# Patient Record
Sex: Male | Born: 1955
Health system: Southern US, Community
[De-identification: ages and names within clinical notes are randomized; demographics above are authoritative.]

## PROBLEM LIST (undated history)

## (undated) DIAGNOSIS — E7800 Pure hypercholesterolemia, unspecified: Secondary | ICD-10-CM

## (undated) DIAGNOSIS — M109 Gout, unspecified: Secondary | ICD-10-CM

## (undated) DIAGNOSIS — I2510 Atherosclerotic heart disease of native coronary artery without angina pectoris: Secondary | ICD-10-CM

## (undated) DIAGNOSIS — S82891A Other fracture of right lower leg, initial encounter for closed fracture: Secondary | ICD-10-CM

## (undated) DIAGNOSIS — I499 Cardiac arrhythmia, unspecified: Secondary | ICD-10-CM

## (undated) DIAGNOSIS — I639 Cerebral infarction, unspecified: Secondary | ICD-10-CM

## (undated) DIAGNOSIS — I442 Atrioventricular block, complete: Secondary | ICD-10-CM

## (undated) HISTORY — DX: Pure hypercholesterolemia, unspecified: E78.00

## (undated) HISTORY — DX: Cardiac arrhythmia, unspecified: I49.9

## (undated) HISTORY — DX: Cerebral infarction, unspecified: I63.9

## (undated) HISTORY — DX: Gout, unspecified: M10.9

## (undated) HISTORY — DX: Other fracture of right lower leg, initial encounter for closed fracture: S82.891A

## (undated) HISTORY — PX: OTHER SURGICAL HISTORY: SHX169

---

## 2011-06-29 DIAGNOSIS — I499 Cardiac arrhythmia, unspecified: Secondary | ICD-10-CM

## 2011-06-29 HISTORY — DX: Cardiac arrhythmia, unspecified: I49.9

## 2018-04-04 DIAGNOSIS — Z23 Encounter for immunization: Secondary | ICD-10-CM | POA: Diagnosis not present

## 2018-06-09 ENCOUNTER — Telehealth: Payer: Self-pay | Admitting: *Deleted

## 2018-06-09 NOTE — Telephone Encounter (Signed)
Referral sent to scheduling and notes on file. °

## 2018-06-23 DIAGNOSIS — E7800 Pure hypercholesterolemia, unspecified: Secondary | ICD-10-CM

## 2018-06-23 DIAGNOSIS — I4891 Unspecified atrial fibrillation: Secondary | ICD-10-CM | POA: Insufficient documentation

## 2018-06-23 DIAGNOSIS — S82891A Other fracture of right lower leg, initial encounter for closed fracture: Secondary | ICD-10-CM

## 2018-06-23 HISTORY — DX: Unspecified atrial fibrillation: I48.91

## 2018-06-23 NOTE — Progress Notes (Signed)
Cardiology Office Note   Date:  06/26/2018   ID:  Levi ConnersMarc Cirrincione, DOB [birth year], MRN [id number]  PCP:  Patient, No Pcp Per  Cardiologist:   Charlton HawsPeter Gicela Schwarting, MD   No chief complaint on file.     History of Present Illness: Levi Davis is a 62 y.o. male who presents for consultation regarding PAF and HLD. Referred for f/u by his cardiologist in [place] Dr Lovette ClicheSoliman [place] Distant history of PAF in 2013. Low CHADSVASC not on anticoagulation TTE from 07/07/15 Normal EF 55-60% borderline LAE and trivial MR Family history remarkable for father needing a PPM Non smoker occassional ETOH Previously maintained on Multaq and atenolol August 2018 Multaq d/c but seemed to have more PAF. He indicated "going down the ablation path just before coming to [place]"   He works in [employer] Got job here in July Wife is a NP. Two children [place] got a [place] and [place].  Like to golf Has apple watch and usually knows when he is in afib Infrequent and only lasts minutes   Past Medical History:  Diagnosis Date  . Arrhythmia 2013   AFIB  . Closed right ankle fracture   . Hypercholesterolemia        Current Outpatient Medications  Medication Sig Dispense Refill  . allopurinol (ZYLOPRIM) 300 MG tablet Take 300 mg by mouth daily.    Marland Kitchen. atenolol (TENORMIN) 50 MG tablet Take 25 mg by mouth daily.    Marland Kitchen. atorvastatin (LIPITOR) 20 MG tablet Take 10 mg by mouth daily.    . Cholecalciferol (VITAMIN D3) 125 MCG (5000 UT) CAPS Take by mouth.    . co-enzyme Q-10 30 MG capsule Take 30 mg by mouth daily.    Marland Kitchen. dronedarone (MULTAQ) 400 MG tablet Take 200 mg by mouth 2 (two) times daily with a meal.    . Krill Oil 1000 MG CAPS Take 2 capsules by mouth 2 (two) times daily.    . medium chain triglycerides (MCT OIL) oil Take 15 mLs by mouth daily.    . Multiple Vitamin (MULTIVITAMIN) tablet Take 1 tablet by mouth daily.    . Omega-3 Fatty Acids (FISH OIL) 1000 MG CAPS Take 2 capsules by mouth daily.    . Probiotic  Product (PROBIOTIC-10) CAPS Take 2-3 capsules by mouth daily.    . vitamin B-12 (CYANOCOBALAMIN) 1000 MCG tablet Take 1,000 mcg by mouth 2 (two) times daily.     No current facility-administered medications for this visit.     Allergies:   Patient has no known allergies.    Social History:  The patient  reports that he has never smoked. He has never used smokeless tobacco. He reports current alcohol use. He reports that he does not use drugs.   Family History:  The patient's family history includes Aneurysm in his sister; Anuerysm in his mother; Heart Problems in his father; High blood pressure in his father, mother, and sister; Obesity in his sister; Stroke in his mother.    ROS:  Please see the history of present illness.   Otherwise, review of systems are positive for none.   All other systems are reviewed and negative.    PHYSICAL EXAM: VS:  BP 110/78 (BP Location: Left Arm, Patient Position: Sitting, Cuff Size: Normal)   Pulse (!) 57   Ht 6' (1.829 m)   Wt 192 lb 1.9 oz (87.1 kg)   BMI 26.06 kg/m  , BMI Body mass index is  26.06 kg/m. Affect appropriate Healthy:  appears stated age HEENT: normal Neck supple with no adenopathy JVP normal no bruits no thyromegaly Lungs clear with no wheezing and good diaphragmatic motion Heart:  S1/S2 no murmur, no rub, gallop or click PMI normal Abdomen: benighn, BS positve, no tenderness, no AAA no bruit.  No HSM or HJR Distal pulses intact with no bruits No edema Neuro non-focal Skin warm and dry No muscular weakness    EKG:  06/26/18 SR rate 57 LAE/LAD/LVH QT 457c   Recent Labs: No results found for requested labs within last 8760 hours.    Lipid Panel No results found for: CHOL, TRIG, HDL, CHOLHDL, VLDL, LDLCALC, LDLDIRECT    Wt Readings from Last 3 Encounters:  06/26/18 192 lb 1.9 oz (87.1 kg)      Other studies Reviewed: Additional studies/ records that were reviewed today include: Notes from cardiology in [place], TTE  labs .    ASSESSMENT AND PLAN:  1.  PAF:  Distant history May 2013. Seems to have self limited recurrences on Multaq. He prefers to continue as is for now and not be referred to EP for ablation  Low CHADVASC no need for anticoagulation Update TTE to assess atrial chamber sizes valve disease and EF  2. HLD   Continue statin lab with primary  3. CAD:  No history of discussed utility of calcium score to further risk stratify and guide tightness of LDL control on statin    Current medicines are reviewed at length with the patient today.  The patient does not have concerns regarding medicines.  The following changes have been made:  no change  Labs/ tests ordered today include: Calcium Score TTE   Orders Placed This Encounter  Procedures  . CT CARDIAC SCORING  . EKG 12-Lead  . ECHOCARDIOGRAM COMPLETE     Disposition:   FU with cardiology PRN      Signed, Charlton HawsPeter Abiageal Blowe, MD  06/26/2018 4:19 PM    [facility] [address], [place], [place]  [address] Phone: [phone]; Fax: [phone]) [phone]

## 2018-06-26 ENCOUNTER — Ambulatory Visit (INDEPENDENT_AMBULATORY_CARE_PROVIDER_SITE_OTHER): Payer: BLUE CROSS/BLUE SHIELD | Admitting: Cardiovascular Disease

## 2018-06-26 ENCOUNTER — Encounter: Payer: Self-pay | Admitting: Cardiovascular Disease

## 2018-06-26 VITALS — BP 110/78 | HR 57 | Ht 72.0 in | Wt 192.1 lb

## 2018-06-26 DIAGNOSIS — I4891 Unspecified atrial fibrillation: Secondary | ICD-10-CM | POA: Diagnosis not present

## 2018-06-26 MED ORDER — DRONEDARONE HCL 400 MG PO TABS: 200.0000 mg | ORAL_TABLET | Freq: Two times a day (BID) | ORAL | 3 refills | Status: DC

## 2018-06-26 MED ORDER — ATENOLOL 50 MG PO TABS: 25.0000 mg | ORAL_TABLET | Freq: Every day | ORAL | 3 refills | Status: DC

## 2018-06-26 NOTE — Patient Instructions (Addendum)
Medication Instructions:   If you need a refill on your cardiac medications before your next appointment, please call your pharmacy.   Lab work:  If you have labs (blood work) drawn today and your tests are completely normal, you will receive your results only by: Marland Kitchen. MyChart Message (if you have MyChart) OR . A paper copy in the mail If you have any lab test that is abnormal or we need to change your treatment, we will call you to review the results.  Testing/Procedures: Your physician has requested that you have an echocardiogram. Echocardiography is a painless test that uses sound waves to create images of your heart. It provides your doctor with information about the size and shape of your heart and how well your heart's chambers and valves are working. This procedure takes approximately one hour. There are no restrictions for this procedure.  Non-Cardiac CT scanning for calcium score, (CAT scanning), is a noninvasive, special x-ray that produces cross-sectional images of the body using x-rays and a computer. CT scans help physicians diagnose and treat medical conditions. For some CT exams, a contrast material is used to enhance visibility in the area of the body being studied. CT scans provide greater clarity and reveal more details than regular x-ray exams.  Follow-Up: At [facility], you and your health needs are our priority.  As part of our continuing mission to provide you with exceptional heart care, we have created designated Provider Care Teams.  These Care Teams include your primary Cardiologist (physician) and Advanced Practice Providers (APPs -  Physician Assistants and Nurse Practitioners) who all work together to provide you with the care you need, when you need it. You will need a follow up appointment in 6 months.  Please call our office 2 months in advance to schedule this appointment.  You may see Dr. Eden EmmsNishan or one of the following Advanced Practice Providers on your  designated Care Team:   Norma FredricksonLori Gerhardt, NP Nada BoozerLaura Ingold, NP . Georgie ChardJill McDaniel, NP

## 2018-06-26 NOTE — Addendum Note (Signed)
Addended by: Virl AxePATE INGALLS, Shaila Gilchrest L on: 06/26/2018 05:00 PM   Modules accepted: Orders

## 2018-11-15 ENCOUNTER — Telehealth (HOSPITAL_COMMUNITY): Payer: Self-pay | Admitting: Radiology

## 2018-11-15 NOTE — Telephone Encounter (Signed)
Left message reporting appointment time change for echocardiogram. Appointment moved from 830 am to 845 am.

## 2018-11-24 ENCOUNTER — Telehealth (HOSPITAL_COMMUNITY): Payer: Self-pay | Admitting: Cardiology

## 2018-11-24 NOTE — Telephone Encounter (Signed)

## 2018-11-27 ENCOUNTER — Ambulatory Visit
Admission: RE | Admit: 2018-11-27 | Discharge: 2018-11-27 | Disposition: A | Discharge status: Home / Self Care | Payer: Self-pay | Source: Ambulatory Visit | Attending: Cardiovascular Disease | Admitting: Cardiovascular Disease

## 2018-11-27 ENCOUNTER — Ambulatory Visit (HOSPITAL_COMMUNITY): Payer: BLUE CROSS/BLUE SHIELD | Attending: Cardiology

## 2018-11-27 ENCOUNTER — Other Ambulatory Visit: Payer: Self-pay

## 2018-11-27 ENCOUNTER — Telehealth: Payer: Self-pay | Admitting: Pharmacist

## 2018-11-27 ENCOUNTER — Telehealth: Payer: Self-pay | Admitting: Cardiovascular Disease

## 2018-11-27 DIAGNOSIS — I4891 Unspecified atrial fibrillation: Secondary | ICD-10-CM

## 2018-11-27 DIAGNOSIS — E785 Hyperlipidemia, unspecified: Secondary | ICD-10-CM

## 2018-11-27 NOTE — Telephone Encounter (Signed)
Virtual Visit Pre-Appointment Phone Call  "(Name), I am calling you today to discuss your upcoming appointment. We are currently trying to limit exposure to the virus that causes COVID-19 by seeing patients at home rather than in the office."  1. "What is the BEST phone number to call the day of the visit?" - include this in appointment notes  2. "Do you have or have access to (through a family member/friend) a smartphone with video capability that we can use for your visit?" a. If yes - list this number in appt notes as "cell" (if different from BEST phone #) and list the appointment type as a VIDEO visit in appointment notes b. If no - list the appointment type as a PHONE visit in appointment notes  3. Confirm consent - "In the setting of the current Covid19 crisis, you are scheduled for a (phone or video) visit with your provider on (date) at (time).  Just as we do with many in-office visits, in order for you to participate in this visit, we must obtain consent.  If you'd like, I can send this to your mychart (if signed up) or email for you to review.  Otherwise, I can obtain your verbal consent now.  All virtual visits are billed to your insurance company just like a normal visit would be.  By agreeing to a virtual visit, we'd like you to understand that the technology does not allow for your provider to perform an examination, and thus may limit your provider's ability to fully assess your condition. If your provider identifies any concerns that need to be evaluated in person, we will make arrangements to do so.  Finally, though the technology is pretty good, we cannot assure that it will always work on either your or our end, and in the setting of a video visit, we may have to convert it to a phone-only visit.  In either situation, we cannot ensure that we have a secure connection.  Are you willing to proceed? Yes  4. Advise patient to be prepared - "Two hours prior to your appointment, go  ahead and check your blood pressure, pulse, oxygen saturation, and your weight (if you have the equipment to check those) and write them all down. When your visit starts, your provider will ask you for this information. If you have an Apple Watch or Kardia device, please plan to have heart rate information ready on the day of your appointment. Please have a pen and paper handy nearby the day of the visit as well."  5. Give patient instructions for MyChart download to smartphone OR Doximity/Doxy.me as below if video visit (depending on what platform provider is using)  6. Inform patient they will receive a phone call 15 minutes prior to their appointment time (may be from unknown caller ID) so they should be prepared to answer    TELEPHONE CALL NOTE  Levi Davis has been deemed a candidate for a follow-up tele-health visit to limit community exposure during the Covid-19 pandemic. I spoke with the patient via phone to ensure availability of phone/video source, confirm preferred email & phone number, and discuss instructions and expectations.  I reminded Levi Davis to be prepared with any vital sign and/or heart rhythm information that could potentially be obtained via home monitoring, at the time of his visit. I reminded Levi Davis to expect a phone call prior to his visit.  Ethelda Chick, RN 11/27/2018 3:09 PM   INSTRUCTIONS FOR DOWNLOADING THE  MYCHART APP TO SMARTPHONE  - The patient must first make sure to have activated MyChart and know their login information - If Apple, go to [employer] and type in MyChart in the search bar and download the app. If Android, ask patient to go to [employer] and type in [place] in the search bar and download the app. The app is free but as with any other app downloads, their phone may require them to verify saved payment information or Apple/Android password.  - The patient will need to then log into the app with their MyChart username and  password, and select [facility] as their healthcare provider to link the account. When it is time for your visit, go to the MyChart app, find appointments, and click Begin Video Visit. Be sure to Select Allow for your device to access the Microphone and Camera for your visit. You will then be connected, and your provider will be with you shortly.  **If they have any issues connecting, or need assistance please contact MyChart service desk [phone])**  **If using a computer, in order to ensure the best quality for their visit they will need to use either of the following Internet Browsers: [employer], or Google Chrome**  IF USING DOXIMITY or DOXY.ME - The patient will receive a link just prior to their visit by text.     FULL LENGTH CONSENT FOR TELE-HEALTH VISIT   I hereby voluntarily request, consent and authorize [facility] and its employed or contracted physicians, physician assistants, nurse practitioners or other licensed health care professionals (the Practitioner), to provide me with telemedicine health care services (the "Services") as deemed necessary by the treating Practitioner. I acknowledge and consent to receive the Services by the Practitioner via telemedicine. I understand that the telemedicine visit will involve communicating with the Practitioner through live audiovisual communication technology and the disclosure of certain medical information by electronic transmission. I acknowledge that I have been given the opportunity to request an in-person assessment or other available alternative prior to the telemedicine visit and am voluntarily participating in the telemedicine visit.  I understand that I have the right to withhold or withdraw my consent to the use of telemedicine in the course of my care at any time, without affecting my right to future care or treatment, and that the Practitioner or I may terminate the telemedicine visit at any time. I  understand that I have the right to inspect all information obtained and/or recorded in the course of the telemedicine visit and may receive copies of available information for a reasonable fee.  I understand that some of the potential risks of receiving the Services via telemedicine include:  Marland Kitchen Delay or interruption in medical evaluation due to technological equipment failure or disruption; . Information transmitted may not be sufficient (e.g. poor resolution of images) to allow for appropriate medical decision making by the Practitioner; and/or  . In rare instances, security protocols could fail, causing a breach of personal health information.  Furthermore, I acknowledge that it is my responsibility to provide information about my medical history, conditions and care that is complete and accurate to the best of my ability. I acknowledge that Practitioner's advice, recommendations, and/or decision may be based on factors not within their control, such as incomplete or inaccurate data provided by me or distortions of diagnostic images or specimens that may result from electronic transmissions. I understand that the practice of medicine is not an exact science and that Practitioner makes  no warranties or guarantees regarding treatment outcomes. I acknowledge that I will receive a copy of this consent concurrently upon execution via email to the email address I last provided but may also request a printed copy by calling the office of [facility].    I understand that my insurance will be billed for this visit.   I have read or had this consent read to me. . I understand the contents of this consent, which adequately explains the benefits and risks of the Services being provided via telemedicine.  . I have been provided ample opportunity to ask questions regarding this consent and the Services and have had my questions answered to my satisfaction. . I give my informed consent for the services to be  provided through the use of telemedicine in my medical care  By participating in this telemedicine visit I agree to the above.

## 2018-11-27 NOTE — Telephone Encounter (Signed)
Left message to set up time to discuss lipids/Nexletol in the next week or so.

## 2018-11-27 NOTE — Telephone Encounter (Signed)
-----   Message from Ethelda Chick, RN sent at 11/27/2018  3:07 PM EDT ----- Patient needs lipid clinic appt to discuss nexletol. Would like to have before Dr. Fabio Bering office visit on 12/11/18.   Thank you, Pam

## 2018-11-27 NOTE — Telephone Encounter (Signed)
Patient aware of Dr. Fabio Bering advisement. Per Dr. Eden Emms, LDL too high if he is worried about risk of MI. Have him see lipid clinic for statin alternative ? Nexletol. Will place order for lipid clinic referral and have them see him before his virtual visit with Dr. Eden Emms.

## 2018-11-27 NOTE — Telephone Encounter (Signed)
Please call patient with results of CT scan.

## 2018-11-30 NOTE — Telephone Encounter (Signed)
Spoke with patient. Scheduled for virtual visit on 6/10 at 1:00

## 2018-12-04 NOTE — Progress Notes (Signed)
Virtual Visit via Video Note   This visit type was conducted due to national recommendations for restrictions regarding the COVID-19 Pandemic (e.g. social distancing) in an effort to limit this patient's exposure and mitigate transmission in our community.  Due to his co-morbid illnesses, this patient is at least at moderate risk for complications without adequate follow up.  This format is felt to be most appropriate for this patient at this time.  All issues noted in this document were discussed and addressed.  A limited physical exam was performed with this format.  Please refer to the patient's chart for his consent to telehealth for [facility].   Date:  12/11/2018   ID:  Levi Davis, DOB [birth year], MRN [id number]  Patient Location: Home Provider Location: Office  PCP:  Patient, No Pcp Per  Cardiologist:   Eden EmmsNishan Electrophysiologist:  None   Evaluation Performed:  Follow-Up Visit  Chief Complaint:  PAF, HLD   History of Present Illness:    63 y.o. history of HLD, PAF. Low CHADVASC score not on anticoagulation last episode PAF 2013 with normal EF by echo 2017 Previously on Multaq and atenolol stopped in 2018  Calcium score 11/27/18 192 elevated 73 rd percentile for age. Echo updated 11/27/18 EF 60-65% no valve disease LA 4.2 cm  Did not want to be on statin and referred to lipid clinic to consider nexletol or alternative   He works in [employer] Got job here in July Wife is a NP. Two children one just got a [place] and one  [place].  Like to golf Has apple watch and usually knows when he is in afib Infrequent and only lasts minutes  The patient  does not have symptoms concerning for COVID-19 infection (fever, chills, cough, or new shortness of breath).    Past Medical History:  Diagnosis Date  . Arrhythmia 2013   AFIB  . Closed right ankle fracture   . Hypercholesterolemia       Current Meds  Medication Sig  . atenolol (TENORMIN) 50 MG tablet Take 0.5  tablets (25 mg total) by mouth daily.  . Cholecalciferol (VITAMIN D3) 125 MCG (5000 UT) CAPS Take by mouth.  . co-enzyme Q-10 30 MG capsule Take 30 mg by mouth daily.  Marland Kitchen. dronedarone (MULTAQ) 400 MG tablet Take 0.5 tablets (200 mg total) by mouth 2 (two) times daily with a meal.  . Krill Oil 1000 MG CAPS Take 2 capsules by mouth 2 (two) times daily.  . medium chain triglycerides (MCT OIL) oil Take 15 mLs by mouth daily.  . Multiple Vitamin (MULTIVITAMIN) tablet Take 1 tablet by mouth daily.  . Omega-3 Fatty Acids (FISH OIL) 1000 MG CAPS Take 2 capsules by mouth daily.  . Probiotic Product (PROBIOTIC-10) CAPS Take 2-3 capsules by mouth daily.  . vitamin B-12 (CYANOCOBALAMIN) 1000 MCG tablet Take 1,000 mcg by mouth 2 (two) times daily.     Allergies:   Patient has no known allergies.   Social History   Tobacco Use  . Smoking status: Never Smoker  . Smokeless tobacco: Never Used  Substance Use Topics  . Alcohol use: Yes  . Drug use: Never     Family Hx: The patient's family history includes Aneurysm in his sister; Anuerysm in his mother; Heart Problems in his father; High blood pressure in his father, mother, and sister; Obesity in his sister; Stroke in his mother.  ROS:   Please see the history of present illness.  All other systems reviewed and are negative.   Prior CV studies:   The following studies were reviewed today:  Echo 6/1 EF normal mild LAE  Calcium score 6/1/ 192   Labs/Other Tests and Data Reviewed:    EKG:  NSR normal ECG   Recent Labs: No results found for requested labs within last 8760 hours.   Recent Lipid Panel No results found for: CHOL, TRIG, HDL, CHOLHDL, LDLCALC, LDLDIRECT  Wt Readings from Last 3 Encounters:  12/11/18 77.6 kg  06/26/18 87.1 kg     Objective:    Vital Signs:  Pulse 62   Ht 6' (1.829 m)   Wt 77.6 kg   BMI 23.19 kg/m    Skin warm and dry No distress No tachypnea No JVP elevation  Neuro appears non focal No edema   Telephone visit no exam   ASSESSMENT & PLAN:    PAF:  Does not want to be referred for ablation continue Multaq limited break through  HLD:  See notes from lipid clinic Target LDL less than 70 see below  Lipi med profile in 6 months Did mention red yeast rice to him as natural alternative  CAD: sublinical with high calcium score for age 37 rd percentile at 32 Risk factor modification   COVID-19 Education: The signs and symptoms of COVID-19 were discussed with the patient and how to seek care for testing (follow up with PCP or arrange E-visit).  The importance of social distancing was discussed today.  Time:   Today, I have spent 30 minutes with the patient with telehealth technology discussing the above problems.     Medication Adjustments/Labs and Tests Ordered: Current medicines are reviewed at length with the patient today.  Concerns regarding medicines are outlined above.   Tests Ordered:  Lipo Med Profile in 6 months   Medication Changes:  None   Disposition:  F/U in 6 months   Signed, Jenkins Rouge, MD  12/11/2018 8:03 AM    [place]

## 2018-12-06 ENCOUNTER — Telehealth (INDEPENDENT_AMBULATORY_CARE_PROVIDER_SITE_OTHER): Payer: BLUE CROSS/BLUE SHIELD | Admitting: Pharmacist

## 2018-12-06 ENCOUNTER — Other Ambulatory Visit: Payer: Self-pay

## 2018-12-06 DIAGNOSIS — E7800 Pure hypercholesterolemia, unspecified: Secondary | ICD-10-CM

## 2018-12-06 NOTE — Progress Notes (Signed)
Patient ID: Levi Davis                 DOB: 04/16/1956                    MRN: [id number]     HPI: Levi Davis is a 63 y.o. male patient referred to lipid clinic by Dr. Johnsie Cancel. PMH is significant for afib, hyperlipidemia and CAD (coronary calcium score 192 -73rd percentile).  Visit today was done over the phone due to [place] 19 Pandemic.  Patient was very pleasant on the phone, but was up front about not really wanting to be on medications. He has recently made several lifestyle changes. He has lost 40lb, switched to a keto diet, significantly improved his fasting glucose, increased his exercise. 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  He has concerns over memory issues reported with statins. He stopped his Lipitor about 6 months ago. States that he would like to see what his coronary calcium score is in 6 months to see if his his lifestyle changes have made any improvement. He states that he does have a family history of CAD, but his parents smoked and drank and were over weight.   Current Medications: none Intolerances: none Risk Factors: significant calcium in proximal and mid LAD LDL goal: <70  Diet: keto diet, eliminated carbs/sugars from diet, intermittent fasting, occassional 36hr fasts  Exercise: exercises regularly   Family History: The patient's family history includes Aneurysm in his sister; Anuerysm in his mother; Heart Problems in his father; High blood pressure in his father, mother, and sister; Obesity in his sister; Stroke in his mother.    Social History: The patient  reports that he has never smoked. He has never used smokeless tobacco. He reports current alcohol use. He reports that he does not use drugs.   Labs: Last LDL was 160, he went for labwork today-still pending  Past Medical History:  Diagnosis Date  . Arrhythmia 2013   AFIB  . Closed right ankle fracture   . Hypercholesterolemia     Current  Outpatient Medications on File Prior to Visit  Medication Sig Dispense Refill  . allopurinol (ZYLOPRIM) 300 MG tablet Take 300 mg by mouth daily.    Marland Kitchen atenolol (TENORMIN) 50 MG tablet Take 0.5 tablets (25 mg total) by mouth daily. 90 tablet 3  . atorvastatin (LIPITOR) 20 MG tablet Take 10 mg by mouth daily.    . Cholecalciferol (VITAMIN D3) 125 MCG (5000 UT) CAPS Take by mouth.    . co-enzyme Q-10 30 MG capsule Take 30 mg by mouth daily.    Marland Kitchen dronedarone (MULTAQ) 400 MG tablet Take 0.5 tablets (200 mg total) by mouth 2 (two) times daily with a meal. 90 tablet 3  . Krill Oil 1000 MG CAPS Take 2 capsules by mouth 2 (two) times daily.    . medium chain triglycerides (MCT OIL) oil Take 15 mLs by mouth daily.    . Multiple Vitamin (MULTIVITAMIN) tablet Take 1 tablet by mouth daily.    . Omega-3 Fatty Acids (FISH OIL) 1000 MG CAPS Take 2 capsules by mouth daily.    . Probiotic Product (PROBIOTIC-10) CAPS Take 2-3 capsules by mouth daily.    . vitamin B-12 (CYANOCOBALAMIN) 1000 MCG tablet Take 1,000 mcg by mouth 2 (two) times daily.     No current facility-administered medications on file prior to visit.     No Known Allergies  Assessment/Plan:  1. Hyperlipidemia - LDL is not at goal of <70. Discussed patients ASCVD risk score of 8.5% (intermediate risk), however his coronary calcium score pushes him into a higher risk category. I discussed how I would strongly recommend he be on a statin. Discussed how statins stabilize plaque, help with plaque regression and reduce his risk of heart attack and stroke. Explained that 75% of the cholesterol we have is made by our body, only 25% is from our diet. Therefore, we can make great changes to our diet, but sometimes it is not enough to lower our LDL to goal due to genetics. I did commend the patient on his lifestyle changes. Patient wishes to discuss with Dr. Eden EmmsNishan on Monday and to hopefully wait another 6 months and then repeat coronary calcium score  before making a decision.   Thank you,  Olene FlossMelissa D Giuliano Preece, Pharm.D, BCPS [facility]  [address], [place], [place] [address]  Phone: [phone]; Fax: [phone]

## 2018-12-11 ENCOUNTER — Telehealth (INDEPENDENT_AMBULATORY_CARE_PROVIDER_SITE_OTHER): Payer: BLUE CROSS/BLUE SHIELD | Admitting: Cardiovascular Disease

## 2018-12-11 ENCOUNTER — Other Ambulatory Visit: Payer: Self-pay

## 2018-12-11 ENCOUNTER — Encounter: Payer: Self-pay | Admitting: Cardiovascular Disease

## 2018-12-11 VITALS — HR 62 | Ht 72.0 in | Wt 171.0 lb

## 2018-12-11 DIAGNOSIS — I480 Paroxysmal atrial fibrillation: Secondary | ICD-10-CM

## 2018-12-11 DIAGNOSIS — E782 Mixed hyperlipidemia: Secondary | ICD-10-CM

## 2018-12-11 NOTE — Patient Instructions (Addendum)
Medication Instructions:   If you need a refill on your cardiac medications before your next appointment, please call your pharmacy.   Lab work: Your physician recommends that you have a Lipomed Profile lab work done. Someone will call you to schedule this appointment.  If you have labs (blood work) drawn today and your tests are completely normal, you will receive your results only by: Marland Kitchen MyChart Message (if you have MyChart) OR . A paper copy in the mail If you have any lab test that is abnormal or we need to change your treatment, we will call you to review the results.  Testing/Procedures:   Follow-Up: At [facility], you and your health needs are our priority.  As part of our continuing mission to provide you with exceptional heart care, we have created designated Provider Care Teams.  These Care Teams include your primary Cardiologist (physician) and Advanced Practice Providers (APPs -  Physician Assistants and Nurse Practitioners) who all work together to provide you with the care you need, when you need it. You will need a follow up appointment in 6 months.  Please call our office 2 months in advance to schedule this appointment.  You may see Dr. Johnsie Cancel or one of the following Advanced Practice Providers on your designated Care Team:   Truitt Merle, NP Cecilie Kicks, NP . Kathyrn Drown, NP

## 2018-12-14 ENCOUNTER — Telehealth: Payer: Self-pay

## 2018-12-14 NOTE — Telephone Encounter (Signed)
    COVID-19 Pre-Screening Questions:  . In the past 7 to 10 days have you had a cough,  shortness of breath, headache, congestion, fever (100 or greater) body aches, chills, sore throat, or sudden loss of taste or sense of smell? . Have you been around anyone with known Covid 19. . Have you been around anyone who is awaiting Covid 19 test results in the past 7 to 10 days? . Have you been around anyone who has been exposed to Covid 19, or has mentioned symptoms of Covid 19 within the past 7 to 10 days?  If you have any concerns/questions about symptoms patients report during screening (either on the phone or at threshold). Contact the provider seeing the patient or DOD for further guidance.  If neither are available contact a member of the leadership team.          Pt answered No to all questions. kb

## 2018-12-15 ENCOUNTER — Other Ambulatory Visit: Payer: BLUE CROSS/BLUE SHIELD | Admitting: *Deleted

## 2018-12-15 ENCOUNTER — Other Ambulatory Visit: Payer: Self-pay

## 2018-12-15 DIAGNOSIS — E782 Mixed hyperlipidemia: Secondary | ICD-10-CM | POA: Diagnosis not present

## 2018-12-15 DIAGNOSIS — I480 Paroxysmal atrial fibrillation: Secondary | ICD-10-CM

## 2018-12-16 LAB — NMR, LIPOPROFILE
Cholesterol, Total: 213 mg/dL — ABNORMAL HIGH (ref 100–199)
HDL Particle Number: 32.7 umol/L (ref >=30.5)
HDL-C: 61 mg/dL (ref >39)
LDL Particle Number: 1368 nmol/L — ABNORMAL HIGH (ref <1000)
LDL Size: 21.7 nm (ref >20.5)
LDL-C: 138 mg/dL — ABNORMAL HIGH (ref 0–99)
LP-IR Score: 25 (ref <=45)
Small LDL Particle Number: 287 nmol/L (ref <=527)
Triglycerides: 70 mg/dL (ref 0–149)

## 2018-12-27 ENCOUNTER — Ambulatory Visit: Payer: BLUE CROSS/BLUE SHIELD

## 2019-06-24 ENCOUNTER — Other Ambulatory Visit: Payer: Self-pay | Admitting: Cardiovascular Disease

## 2019-06-26 ENCOUNTER — Encounter: Payer: Self-pay | Admitting: *Deleted

## 2019-07-05 ENCOUNTER — Encounter: Payer: Self-pay | Admitting: Cardiology

## 2019-07-05 ENCOUNTER — Telehealth (INDEPENDENT_AMBULATORY_CARE_PROVIDER_SITE_OTHER): Admitting: Cardiology

## 2019-07-05 ENCOUNTER — Other Ambulatory Visit: Payer: Self-pay

## 2019-07-05 ENCOUNTER — Telehealth: Payer: Self-pay

## 2019-07-05 VITALS — BP 110/60 | HR 62 | Temp 97.0°F | Ht 72.0 in | Wt 179.0 lb

## 2019-07-05 DIAGNOSIS — E782 Mixed hyperlipidemia: Secondary | ICD-10-CM

## 2019-07-05 DIAGNOSIS — I480 Paroxysmal atrial fibrillation: Secondary | ICD-10-CM

## 2019-07-05 DIAGNOSIS — E7800 Pure hypercholesterolemia, unspecified: Secondary | ICD-10-CM

## 2019-07-05 NOTE — Patient Instructions (Addendum)
Medication Instructions:   Your physician recommends that you continue on your current medications as directed. Please refer to the Current Medication list given to you today.  *If you need a refill on your cardiac medications before your next appointment, please call your pharmacy*  Lab Work:  Your physician recommends that you return for lab work on 11/09/19 at 8:30AM, you will need to be fasting for these labs.  If you have labs (blood work) drawn today and your tests are completely normal, you will receive your results only by: Marland Kitchen MyChart Message (if you have MyChart) OR . A paper copy in the mail If you have any lab test that is abnormal or we need to change your treatment, we will call you to review the results.  Testing/Procedures:  None ordered today  Follow-Up: At [facility], you and your health needs are our priority.  As part of our continuing mission to provide you with exceptional heart care, we have created designated Provider Care Teams.  These Care Teams include your primary Cardiologist (physician) and Advanced Practice Providers (APPs -  Physician Assistants and Nurse Practitioners) who all work together to provide you with the care you need, when you need it.  Your next appointment:    Follow up with Dr. Eden Emms on 12/03/19 (Monday) at 9:15AM

## 2019-07-05 NOTE — Progress Notes (Signed)
Virtual Visit via Video Note   This visit type was conducted due to national recommendations for restrictions regarding the COVID-19 Pandemic (e.g. social distancing) in an effort to limit this patient's exposure and mitigate transmission in our community.  Due to his co-morbid illnesses, this patient is at least at moderate risk for complications without adequate follow up.  This format is felt to be most appropriate for this patient at this time.  All issues noted in this document were discussed and addressed.  A limited physical exam was performed with this format.  Please refer to the patient's chart for his consent to telehealth for [facility]   Date:  07/05/2019   ID:  Levi Davis, DOB 11/10/1955, MRN [id number]  Patient Location: Other:  office in [place] Provider Location: Office  PCP:  Patient, No Pcp Per  Cardiologist:  Charlton Haws, MD  Electrophysiologist:  None   Evaluation Performed:  Follow-Up Visit  Chief Complaint:  PAF, HLD  History of Present Illness:    Levi Davis is a 64 y.o. male with atrial fib. Last telehealth with Dr. Eden Emms 12/11/18   HLD, PAF. Low CHADVASC score not on anticoagulation last episode PAF 2013 with normal EF by echo 2017 Previously on Multaq and atenolol stopped in 2018  Calcium score 11/27/18 192 elevated 73 rd percentile for age. Echo updated 11/27/18 EF 60-65% no valve disease LA 4.2 cm  Did not want to be on statin and referred to lipid clinic to consider nexletol or alternative   He works in [employer] job here in [place] Wife is a NP. Two children one just got a job in [place] and one  In [place] . Like to golf Has apple watch and usually knows when he is in afib Infrequent and only lasts minutes  On Multaq and at this time with few break throughs does not want ablation.    Today pt doing well, no chest pain or SOB.  He is hiking and staying very active.  He is taking red yeast rice for cholesterol. Otherwise no issues.  No COVID  symptoms and at this point no plan for vaccine. Still on Keto/antiinflammatory diet.   The patient does not have symptoms concerning for COVID-19 infection (fever, chills, cough, or new shortness of breath).    Past Medical History:  Diagnosis Date  . A-fib (HCC) 06/23/2018  . Arrhythmia 2013   AFIB  . Closed right ankle fracture   . Hypercholesterolemia    Past Surgical History:  Procedure Laterality Date  . broke leg     screws and pins     Current Meds  Medication Sig  . atenolol (TENORMIN) 50 MG tablet Take 0.5 tablets (25 mg total) by mouth daily.  . Cholecalciferol (VITAMIN D3) 125 MCG (5000 UT) CAPS Take by mouth.  . co-enzyme Q-10 30 MG capsule Take 30 mg by mouth daily.  . Cyanocobalamin (VITAMIN B-12 PO) Take 2,500 mcg by mouth daily.  [name] 1000 MG CAPS Take 2 capsules by mouth 2 (two) times daily.  . medium chain triglycerides (MCT OIL) oil Take 15 mLs by mouth daily.  . MULTAQ 400 MG tablet TAKE 1/2 TABLET BY MOUTH TWICE DAILY WITH MEAL  . Multiple Vitamin (MULTIVITAMIN) tablet Take 1 tablet by mouth daily.  . Omega-3 Fatty Acids (FISH OIL) 1000 MG CAPS Take 2 capsules by mouth daily.  . Probiotic Product (PROBIOTIC-10) CAPS Take 2-3 capsules by mouth daily.  . Red Yeast Rice 600 MG TABS  Take 1 tablet by mouth 2 (two) times daily.  . [DISCONTINUED] vitamin B-12 (CYANOCOBALAMIN) 1000 MCG tablet Take 1,000 mcg by mouth 2 (two) times daily.     Allergies:   Patient has no known allergies.   Social History   Tobacco Use  . Smoking status: Never Smoker  . Smokeless tobacco: Never Used  Substance Use Topics  . Alcohol use: Yes  . Drug use: Never     Family Hx: The patient's family history includes Aneurysm in his sister; Anuerysm in his mother; Heart Problems in his father; High blood pressure in his father, mother, and sister; Obesity in his sister; Stroke in his mother.  ROS:   Please see the history of present illness.    General:no colds or fevers,  no weight changes Skin:no rashes or ulcers HEENT:no blurred vision, no congestion CV:see HPI PUL:see HPI GI:no diarrhea constipation or melena, no indigestion GU:no hematuria, no dysuria MS:no joint pain, no claudication Neuro:no syncope, no lightheadedness Endo:no diabetes, no thyroid disease  All other systems reviewed and are negative.   Prior CV studies:   The following studies were reviewed today: Ca score IMPRESSION: Coronary calcium score of 192. This was 55 rd percentile for age and sex matched control.   Labs/Other Tests and Data Reviewed:    EKG:    Recent Labs: No results found for requested labs within last 8760 hours.   Recent Lipid Panel No results found for: CHOL, TRIG, HDL, CHOLHDL, LDLCALC, LDLDIRECT  Wt Readings from Last 3 Encounters:  07/05/19 179 lb (81.2 kg)  12/11/18 171 lb (77.6 kg)  06/26/18 192 lb 1.9 oz (87.1 kg)     Objective:    Vital Signs:  BP 110/60   Pulse 62   Temp (!) 97 F (36.1 C)   Ht 6' (1.829 m)   Wt 179 lb (81.2 kg)   BMI 24.28 kg/m    VITAL SIGNS:  reviewed General, male NAD Skin color normal Pulmonary, can speak in complete sentences without SOB Neuro A&O X 3 answers questions approp.  Psych: pleasant  ASSESSMENT & PLAN:    1. PAF with same amount of break through episodes, he prefers to continue Multaq at current dose.  Current CHA2DS2VASc score of 0 - he is just turning 64.  2. HLD he is on red yeast rice now, will recheck NMR in May and then follow up in June with Dr. Johnsie Cancel  Pt does not wish to take statins 3. CAD-subclinical with high ca+ score- he is active, he eats healthy - continue.    COVID-19 Education: The signs and symptoms of COVID-19 were discussed with the patient and how to seek care for testing (follow up with PCP or arrange E-visit).  The importance of social distancing was discussed today.  Time:   Today, I have spent 10 minutes with the patient with telehealth technology discussing the  above problems.     Medication Adjustments/Labs and Tests Ordered: Current medicines are reviewed at length with the patient today.  Concerns regarding medicines are outlined above.   Tests Ordered: Orders Placed This Encounter  Procedures  . NMR, lipoprofile  . Lipoprotein A (LPA)  . Apolipoprotein B    Medication Changes: No orders of the defined types were placed in this encounter.   Follow Up:  In Person in 6 month(s)  Signed, Cecilie Kicks, NP  07/05/2019 1:33 PM    [facility]

## 2019-07-05 NOTE — Telephone Encounter (Signed)
Follow Up:   Pt is returning your call from My-Chart message.

## 2019-07-05 NOTE — Telephone Encounter (Signed)
I returned call to patient, he is aware of lab and follow up appointments.

## 2019-09-21 ENCOUNTER — Other Ambulatory Visit: Payer: Self-pay | Admitting: Cardiovascular Disease

## 2019-11-09 ENCOUNTER — Other Ambulatory Visit: Payer: Self-pay

## 2019-11-09 ENCOUNTER — Other Ambulatory Visit

## 2019-11-09 ENCOUNTER — Other Ambulatory Visit: Admitting: *Deleted

## 2019-11-09 DIAGNOSIS — E782 Mixed hyperlipidemia: Secondary | ICD-10-CM

## 2019-11-09 DIAGNOSIS — E7800 Pure hypercholesterolemia, unspecified: Secondary | ICD-10-CM

## 2019-11-10 LAB — LIPOPROTEIN A (LPA): Lipoprotein (a): 20.3 nmol/L (ref <75.0)

## 2019-11-10 LAB — NMR, LIPOPROFILE
Cholesterol, Total: 188 mg/dL (ref 100–199)
HDL Particle Number: 33 umol/L (ref >=30.5)
HDL-C: 61 mg/dL (ref >39)
LDL Particle Number: 1185 nmol/L — ABNORMAL HIGH (ref <1000)
LDL Size: 21.3 nm (ref >20.5)
LDL-C (NIH Calc): 116 mg/dL — ABNORMAL HIGH (ref 0–99)
LP-IR Score: <25 (ref <=45)
Small LDL Particle Number: 247 nmol/L (ref <=527)
Triglycerides: 56 mg/dL (ref 0–149)

## 2019-11-10 LAB — APOLIPOPROTEIN B: Apolipoprotein B: 93 mg/dL — ABNORMAL HIGH (ref <90)

## 2019-12-03 ENCOUNTER — Ambulatory Visit: Admitting: Cardiovascular Disease

## 2020-01-11 NOTE — Progress Notes (Signed)
Date:  01/18/2020   ID:  Levi Davis, DOB Feb 08, 1956, MRN [id number]  Patient Location: Other:  office in [place] Provider Location: Office  PCP:  Patient, No Pcp Per  Cardiologist:  Charlton Haws, MD  Electrophysiologist:  None   Evaluation Performed:  Follow-Up Visit  Chief Complaint:  PAF, HLD  History of Present Illness:     64 y.o. with history of HLD, PAF. Low CHADVASC score not on anticoagulation last episode PAF 2013 with normal EF by echo 2017  Calcium score 11/27/18 192 elevated 73 rd percentile for age. Echo updated 11/27/18 EF 60-65% no valve disease LA 4.2 cm  Did not want to be on statin and referred to lipid clinic to consider nexletol or alternative   He works in [employer] job here in July Wife is a NP. Two children one just got a job in [place] and one  In [place]. Like to golf Has apple watch and usually knows when he is in afib Infrequent and only lasts minutes  On Multaq and at this time with few break throughs does not want ablation.    Discussed Nexlitol and Zetia His wife who is a NP will look into them He is only on Red Yeast rice   Works with [employer] a lot as company is based in [place] Has a trip to [place] planned for next week    The patient does not have symptoms concerning for COVID-19 infection (fever, chills, cough, or new shortness of breath).    Past Medical History:  Diagnosis Date   A-fib (HCC) 06/23/2018   Arrhythmia 2013   AFIB   Closed right ankle fracture    Hypercholesterolemia    Past Surgical History:  Procedure Laterality Date   broke leg     screws and pins     Current Meds  Medication Sig   atenolol (TENORMIN) 50 MG tablet TAKE 1/2 TABLET BY MOUTH DAILY   Cholecalciferol (VITAMIN D3) 125 MCG (5000 UT) CAPS Take by mouth.   co-enzyme Q-10 30 MG capsule Take 30 mg by mouth daily.   Cyanocobalamin (VITAMIN B-12 PO) Take 2,500 mcg by mouth daily.   Krill Oil 1000 MG  CAPS Take 2 capsules by mouth 2 (two) times daily.   medium chain triglycerides (MCT OIL) oil Take 15 mLs by mouth daily.   MULTAQ 400 MG tablet TAKE 1/2 TABLET BY MOUTH TWICE DAILY WITH MEAL   Multiple Vitamin (MULTIVITAMIN) tablet Take 1 tablet by mouth daily.   Omega-3 Fatty Acids (FISH OIL) 1000 MG CAPS Take 2 capsules by mouth daily.   Probiotic Product (PROBIOTIC-10) CAPS Take 2-3 capsules by mouth daily.   Red Yeast Rice 600 MG TABS Take 1 tablet by mouth 2 (two) times daily.     Allergies:   Patient has no known allergies.   Social History   Tobacco Use   Smoking status: Never Smoker   Smokeless tobacco: Never Used  Substance Use Topics   Alcohol use: Yes   Drug use: Never     Family Hx: The patient's family history includes Aneurysm in his sister; Anuerysm in his mother; Heart Problems in his father; High blood pressure in his father, mother, and sister; Obesity in his sister; Stroke in his mother.  ROS:   Please see the history of present illness.    General:no colds or fevers, no weight changes Skin:no rashes or ulcers HEENT:no blurred vision, no congestion CV:see HPI PUL:see HPI GI:no  diarrhea constipation or melena, no indigestion GU:no hematuria, no dysuria MS:no joint pain, no claudication Neuro:no syncope, no lightheadedness Endo:no diabetes, no thyroid disease  All other systems reviewed and are negative.   Prior CV studies:   The following studies were reviewed today:  Ca score IMPRESSION: Coronary calcium score of 192. This was 55 rd percentile for age and sex matched control.  TTE 11/27/18  EF 60-65%  No valve disease Mild LAE 4.2 cm    Labs/Other Tests and Data Reviewed:    EKG:  SR rate 59 LAD/LVH LAE 06/26/18   Recent Labs: No results found for requested labs within last 8760 hours.   Recent Lipid Panel No results found for: CHOL, TRIG, HDL, CHOLHDL, LDLCALC, LDLDIRECT  Wt Readings from Last 3 Encounters:  01/18/20 188  lb 6.4 oz (85.5 kg)  07/05/19 179 lb (81.2 kg)  12/11/18 171 lb (77.6 kg)     Objective:    Vital Signs:  BP 112/78    Pulse (!) 124    Ht 6' (1.829 m)    Wt 188 lb 6.4 oz (85.5 kg)    SpO2 98%    BMI 25.55 kg/m    Affect appropriate Healthy:  appears stated age HEENT: normal Neck supple with no adenopathy JVP normal no bruits no thyromegaly Lungs clear with no wheezing and good diaphragmatic motion Heart:  S1/S2 no murmur, no rub, gallop or click PMI normal Abdomen: benighn, BS positve, no tenderness, no AAA no bruit.  No HSM or HJR Distal pulses intact with no bruits No edema Neuro non-focal Skin warm and dry No muscular weakness    ASSESSMENT & PLAN:    1. PAF with minimal amount of break through episodes, he prefers to continue Multaq at current dose.  Current CHA2DS2VASc score of 0 -  2. HLD he is on red yeast rice  Labs from 11/09/19 reviewed and LDL # elevated 1185, LDL 116  Pt does not wish to take statins Gave him options of Nexlitol and Zetia he deferred and wanted to repeat his calcium score in December I told him they don't change that quickly and they tend not to "regress"  3. CAD-subclinical with high ca+ score- he is active, he eats healthy - continue.       Medication Adjustments/Labs and Tests Ordered: Current medicines are reviewed at length with the patient today.  Concerns regarding medicines are outlined above.   Tests Ordered: No orders of the defined types were placed in this encounter.   Medication Changes: No orders of the defined types were placed in this encounter.   Follow Up:  December with acclaim score   Signed, Charlton Haws, MD  01/18/2020 2:46 PM    [facility]

## 2020-01-18 ENCOUNTER — Ambulatory Visit (INDEPENDENT_AMBULATORY_CARE_PROVIDER_SITE_OTHER): Admitting: Cardiovascular Disease

## 2020-01-18 ENCOUNTER — Encounter: Payer: Self-pay | Admitting: Cardiovascular Disease

## 2020-01-18 ENCOUNTER — Other Ambulatory Visit: Payer: Self-pay

## 2020-01-18 VITALS — BP 112/78 | HR 124 | Ht 72.0 in | Wt 188.4 lb

## 2020-01-18 DIAGNOSIS — I480 Paroxysmal atrial fibrillation: Secondary | ICD-10-CM | POA: Diagnosis not present

## 2020-01-18 DIAGNOSIS — I2510 Atherosclerotic heart disease of native coronary artery without angina pectoris: Secondary | ICD-10-CM | POA: Diagnosis not present

## 2020-01-18 NOTE — Patient Instructions (Signed)
Medication Instructions:  *If you need a refill on your cardiac medications before your next appointment, please call your pharmacy*  Lab Work: If you have labs (blood work) drawn today and your tests are completely normal, you will receive your results only by: Marland Kitchen MyChart Message (if you have MyChart) OR . A paper copy in the mail If you have any lab test that is abnormal or we need to change your treatment, we will call you to review the results.  Testing/Procedures: Cardiac CT scanning for calcium score, (CAT scanning), is a noninvasive, special x-ray that produces cross-sectional images of the body using x-rays and a computer. CT scans help physicians diagnose and treat medical conditions. For some CT exams, a contrast material is used to enhance visibility in the area of the body being studied. CT scans provide greater clarity and reveal more details than regular x-ray exams.  Follow-Up: At [facility], you and your health needs are our priority.  As part of our continuing mission to provide you with exceptional heart care, we have created designated Provider Care Teams.  These Care Teams include your primary Cardiologist (physician) and Advanced Practice Providers (APPs -  Physician Assistants and Nurse Practitioners) who all work together to provide you with the care you need, when you need it.  We recommend signing up for the patient portal called "MyChart".  Sign up information is provided on this After Visit Summary.  MyChart is used to connect with patients for Virtual Visits (Telemedicine).  Patients are able to view lab/test results, encounter notes, upcoming appointments, etc.  Non-urgent messages can be sent to your provider as well.   To learn more about what you can do with MyChart, go to [email or web address].    Your next appointment:   2 month(s)  The format for your next appointment:   In Person  Provider:   You may see Charlton Haws, MD or one of the following  Advanced Practice Providers on your designated Care Team:    Norma Fredrickson, NP  Nada Boozer, NP  Georgie Chard, NP

## 2020-04-01 ENCOUNTER — Other Ambulatory Visit: Payer: Self-pay

## 2020-04-01 MED ORDER — MULTAQ 400 MG PO TABS: ORAL_TABLET | ORAL | 2 refills | Status: DC

## 2020-04-01 MED ORDER — ATENOLOL 50 MG PO TABS: 25.0000 mg | ORAL_TABLET | Freq: Every day | ORAL | 2 refills | Status: DC

## 2020-04-16 ENCOUNTER — Telehealth: Payer: Self-pay | Admitting: Cardiovascular Disease

## 2020-04-16 NOTE — Telephone Encounter (Signed)
Patient is requesting to have form from his employer signed by Dr. Eden Emms and returned for COVID vaccination exemption. Please advise.

## 2020-04-17 NOTE — Telephone Encounter (Signed)
Called patient back to let him know of Dr. Fabio Bering response.

## 2020-04-17 NOTE — Telephone Encounter (Signed)
We don't do COVID exceptions should talk to primary

## 2020-05-20 ENCOUNTER — Other Ambulatory Visit: Payer: Self-pay

## 2020-05-20 ENCOUNTER — Ambulatory Visit (INDEPENDENT_AMBULATORY_CARE_PROVIDER_SITE_OTHER)
Admission: RE | Admit: 2020-05-20 | Discharge: 2020-05-20 | Disposition: A | Discharge status: Home / Self Care | Payer: Self-pay | Source: Ambulatory Visit | Attending: Cardiovascular Disease | Admitting: Cardiovascular Disease

## 2020-05-20 DIAGNOSIS — I2510 Atherosclerotic heart disease of native coronary artery without angina pectoris: Secondary | ICD-10-CM

## 2020-05-20 DIAGNOSIS — I480 Paroxysmal atrial fibrillation: Secondary | ICD-10-CM

## 2020-08-04 ENCOUNTER — Other Ambulatory Visit: Payer: Self-pay | Admitting: Cardiovascular Disease

## 2020-08-05 ENCOUNTER — Other Ambulatory Visit: Payer: Self-pay | Admitting: Cardiovascular Disease

## 2021-02-20 ENCOUNTER — Other Ambulatory Visit: Payer: Self-pay | Admitting: Cardiovascular Disease

## 2021-03-04 ENCOUNTER — Other Ambulatory Visit: Payer: Self-pay | Admitting: Cardiovascular Disease

## 2021-03-23 ENCOUNTER — Other Ambulatory Visit: Payer: Self-pay

## 2021-03-23 MED ORDER — MULTAQ 400 MG PO TABS: ORAL_TABLET | ORAL | 0 refills | Status: DC

## 2021-03-24 ENCOUNTER — Telehealth: Payer: Self-pay | Admitting: Cardiovascular Disease

## 2021-03-24 DIAGNOSIS — E782 Mixed hyperlipidemia: Secondary | ICD-10-CM

## 2021-03-24 NOTE — Telephone Encounter (Signed)
Left detailed message.   Placed order and appointment for lab.

## 2021-03-24 NOTE — Telephone Encounter (Signed)
  Patient has 1 yr follow up on 04/08/21 and he would like to get his lipid lab work done before that visit. Can orders be placed?

## 2021-04-07 ENCOUNTER — Inpatient Hospital Stay: Admission: AD | Admit: 2021-04-07 | Admitting: Neurology

## 2021-04-07 ENCOUNTER — Emergency Department (HOSPITAL_COMMUNITY)

## 2021-04-07 ENCOUNTER — Inpatient Hospital Stay (HOSPITAL_COMMUNITY)
Admission: EM | Admit: 2021-04-07 | Discharge: 2021-04-09 | DRG: 069 | Disposition: A | Discharge status: Home / Self Care | Attending: Neurology | Admitting: Neurology

## 2021-04-07 ENCOUNTER — Encounter (HOSPITAL_COMMUNITY): Payer: Self-pay | Admitting: Emergency Medicine

## 2021-04-07 ENCOUNTER — Inpatient Hospital Stay (HOSPITAL_COMMUNITY)

## 2021-04-07 ENCOUNTER — Other Ambulatory Visit: Payer: Self-pay

## 2021-04-07 DIAGNOSIS — U071 COVID-19: Secondary | ICD-10-CM | POA: Diagnosis present

## 2021-04-07 DIAGNOSIS — I4819 Other persistent atrial fibrillation: Secondary | ICD-10-CM | POA: Diagnosis present

## 2021-04-07 DIAGNOSIS — I4891 Unspecified atrial fibrillation: Secondary | ICD-10-CM | POA: Diagnosis not present

## 2021-04-07 DIAGNOSIS — I6389 Other cerebral infarction: Secondary | ICD-10-CM | POA: Diagnosis not present

## 2021-04-07 DIAGNOSIS — R531 Weakness: Secondary | ICD-10-CM | POA: Diagnosis present

## 2021-04-07 DIAGNOSIS — E7800 Pure hypercholesterolemia, unspecified: Secondary | ICD-10-CM | POA: Diagnosis present

## 2021-04-07 DIAGNOSIS — G459 Transient cerebral ischemic attack, unspecified: Secondary | ICD-10-CM | POA: Diagnosis present

## 2021-04-07 DIAGNOSIS — Z823 Family history of stroke: Secondary | ICD-10-CM | POA: Diagnosis not present

## 2021-04-07 DIAGNOSIS — I10 Essential (primary) hypertension: Secondary | ICD-10-CM | POA: Diagnosis present

## 2021-04-07 DIAGNOSIS — R768 Other specified abnormal immunological findings in serum: Secondary | ICD-10-CM

## 2021-04-07 DIAGNOSIS — I4811 Longstanding persistent atrial fibrillation: Secondary | ICD-10-CM

## 2021-04-07 DIAGNOSIS — I6522 Occlusion and stenosis of left carotid artery: Secondary | ICD-10-CM | POA: Diagnosis present

## 2021-04-07 DIAGNOSIS — I651 Occlusion and stenosis of basilar artery: Secondary | ICD-10-CM | POA: Diagnosis present

## 2021-04-07 DIAGNOSIS — Z79899 Other long term (current) drug therapy: Secondary | ICD-10-CM | POA: Diagnosis not present

## 2021-04-07 DIAGNOSIS — Z8616 Personal history of COVID-19: Secondary | ICD-10-CM

## 2021-04-07 LAB — URINALYSIS, ROUTINE W REFLEX MICROSCOPIC
Bilirubin Urine: NEGATIVE
Glucose, UA: NEGATIVE mg/dL
Hgb urine dipstick: NEGATIVE
Ketones, ur: NEGATIVE mg/dL
Leukocytes,Ua: NEGATIVE
Nitrite: NEGATIVE
Protein, ur: NEGATIVE mg/dL
Specific Gravity, Urine: 1.003 — ABNORMAL LOW (ref 1.005–1.030)
pH: 5 (ref 5.0–8.0)

## 2021-04-07 LAB — CBC
HCT: 42.4 % (ref 39.0–52.0)
Hemoglobin: 14.5 g/dL (ref 13.0–17.0)
MCH: 32.7 pg (ref 26.0–34.0)
MCHC: 34.2 g/dL (ref 30.0–36.0)
MCV: 95.7 fL (ref 80.0–100.0)
Platelets: 176 10*3/uL (ref 150–400)
RBC: 4.43 MIL/uL (ref 4.22–5.81)
RDW: 11.9 % (ref 11.5–15.5)
WBC: 7.2 10*3/uL (ref 4.0–10.5)
nRBC: 0 % (ref 0.0–0.2)

## 2021-04-07 LAB — BASIC METABOLIC PANEL
Anion gap: 7 (ref 5–15)
BUN: 19 mg/dL (ref 8–23)
CO2: 26 mmol/L (ref 22–32)
Calcium: 9.4 mg/dL (ref 8.9–10.3)
Chloride: 106 mmol/L (ref 98–111)
Creatinine, Ser: 0.89 mg/dL (ref 0.61–1.24)
GFR, Estimated: >60 mL/min (ref >60)
Glucose, Bld: 92 mg/dL (ref 70–99)
Potassium: 4.1 mmol/L (ref 3.5–5.1)
Sodium: 139 mmol/L (ref 135–145)

## 2021-04-07 LAB — HEPATIC FUNCTION PANEL
ALT: 17 U/L (ref 0–44)
AST: 21 U/L (ref 15–41)
Albumin: 4.6 g/dL (ref 3.5–5.0)
Alkaline Phosphatase: 50 U/L (ref 38–126)
Bilirubin, Direct: 0.1 mg/dL (ref 0.0–0.2)
Indirect Bilirubin: 0.9 mg/dL (ref 0.3–0.9)
Total Bilirubin: 1 mg/dL (ref 0.3–1.2)
Total Protein: 7.5 g/dL (ref 6.5–8.1)

## 2021-04-07 LAB — RAPID URINE DRUG SCREEN, HOSP PERFORMED
Amphetamines: NOT DETECTED
Barbiturates: NOT DETECTED
Benzodiazepines: NOT DETECTED
Cocaine: NOT DETECTED
Opiates: NOT DETECTED
Tetrahydrocannabinol: NOT DETECTED

## 2021-04-07 LAB — DIFFERENTIAL
Abs Immature Granulocytes: 0.03 10*3/uL (ref 0.00–0.07)
Basophils Absolute: 0 10*3/uL (ref 0.0–0.1)
Basophils Relative: 0 %
Eosinophils Absolute: 0.1 10*3/uL (ref 0.0–0.5)
Eosinophils Relative: 1 %
Immature Granulocytes: 0 %
Lymphocytes Relative: 18 %
Lymphs Abs: 1.3 10*3/uL (ref 0.7–4.0)
Monocytes Absolute: 0.5 10*3/uL (ref 0.1–1.0)
Monocytes Relative: 7 %
Neutro Abs: 5.3 10*3/uL (ref 1.7–7.7)
Neutrophils Relative %: 74 %

## 2021-04-07 LAB — RESP PANEL BY RT-PCR (FLU A&B, COVID) ARPGX2
Influenza A by PCR: NEGATIVE
Influenza B by PCR: NEGATIVE
SARS Coronavirus 2 by RT PCR: POSITIVE — AB

## 2021-04-07 LAB — ETHANOL: Alcohol, Ethyl (B): <10 mg/dL (ref <10)

## 2021-04-07 LAB — APTT: aPTT: 27 seconds (ref 24–36)

## 2021-04-07 LAB — C-REACTIVE PROTEIN: CRP: <0.5 mg/dL (ref <1.0)

## 2021-04-07 LAB — LACTATE DEHYDROGENASE: LDH: 200 U/L — ABNORMAL HIGH (ref 98–192)

## 2021-04-07 LAB — PROTIME-INR
INR: 1 (ref 0.8–1.2)
Prothrombin Time: 13.1 seconds (ref 11.4–15.2)

## 2021-04-07 LAB — MRSA NEXT GEN BY PCR, NASAL: MRSA by PCR Next Gen: NOT DETECTED

## 2021-04-07 LAB — D-DIMER, QUANTITATIVE: D-Dimer, Quant: 0.54 ug/mL-FEU — ABNORMAL HIGH (ref 0.00–0.50)

## 2021-04-07 LAB — FERRITIN: Ferritin: 51 ng/mL (ref 24–336)

## 2021-04-07 LAB — MAGNESIUM: Magnesium: 2.1 mg/dL (ref 1.7–2.4)

## 2021-04-07 MED ORDER — HEPARIN (PORCINE) 25000 UT/250ML-% IV SOLN
1100.0000 [IU]/h | INTRAVENOUS | Status: DC
  Administered 2021-04-07: 1100 [IU]/h via INTRAVENOUS
  Filled 2021-04-07: qty 250

## 2021-04-07 MED ORDER — CLOPIDOGREL BISULFATE 75 MG PO TABS
75.0000 mg | ORAL_TABLET | Freq: Once | ORAL | Status: AC
  Administered 2021-04-07: 75 mg via ORAL
  Filled 2021-04-07: qty 1

## 2021-04-07 MED ORDER — IOHEXOL 350 MG/ML SOLN
80.0000 mL | Freq: Once | INTRAVENOUS | Status: AC | PRN
  Administered 2021-04-07: 80 mL via INTRAVENOUS

## 2021-04-07 MED ORDER — ACETAMINOPHEN 325 MG PO TABS: 650.0000 mg | ORAL_TABLET | ORAL | Status: DC | PRN

## 2021-04-07 MED ORDER — ACETAMINOPHEN 160 MG/5ML PO SOLN: 650.0000 mg | ORAL | Status: DC | PRN

## 2021-04-07 MED ORDER — ASPIRIN 81 MG PO CHEW
324.0000 mg | CHEWABLE_TABLET | Freq: Once | ORAL | Status: AC
  Administered 2021-04-07: 324 mg via ORAL
  Filled 2021-04-07: qty 4

## 2021-04-07 MED ORDER — CLOPIDOGREL BISULFATE 75 MG PO TABS: 75.0000 mg | ORAL_TABLET | Freq: Every day | ORAL | Status: DC

## 2021-04-07 MED ORDER — MORPHINE SULFATE (PF) 4 MG/ML IV SOLN
4.0000 mg | Freq: Once | INTRAVENOUS | Status: DC
  Filled 2021-04-07: qty 1

## 2021-04-07 MED ORDER — METOPROLOL TARTRATE 5 MG/5ML IV SOLN: 5.0000 mg | Freq: Four times a day (QID) | INTRAVENOUS | Status: DC | PRN

## 2021-04-07 MED ORDER — HEPARIN BOLUS VIA INFUSION
3500.0000 [IU] | Freq: Once | INTRAVENOUS | Status: AC
  Administered 2021-04-07: 3500 [IU] via INTRAVENOUS
  Filled 2021-04-07: qty 3500

## 2021-04-07 MED ORDER — STROKE: EARLY STAGES OF RECOVERY BOOK
Freq: Once | Status: AC
  Filled 2021-04-07: qty 1

## 2021-04-07 MED ORDER — SENNOSIDES-DOCUSATE SODIUM 8.6-50 MG PO TABS: 1.0000 | ORAL_TABLET | Freq: Every evening | ORAL | Status: DC | PRN

## 2021-04-07 MED ORDER — ASPIRIN 81 MG PO CHEW
324.0000 mg | CHEWABLE_TABLET | Freq: Once | ORAL | Status: DC
  Filled 2021-04-07: qty 4

## 2021-04-07 MED ORDER — ASPIRIN 81 MG PO CHEW: 81.0000 mg | CHEWABLE_TABLET | Freq: Every day | ORAL | Status: DC

## 2021-04-07 MED ORDER — METOPROLOL TARTRATE 5 MG/5ML IV SOLN
5.0000 mg | INTRAVENOUS | Status: DC | PRN
  Administered 2021-04-07: 5 mg via INTRAVENOUS
  Filled 2021-04-07: qty 5

## 2021-04-07 MED ORDER — ACETAMINOPHEN 650 MG RE SUPP: 650.0000 mg | RECTAL | Status: DC | PRN

## 2021-04-07 MED ORDER — ACETAMINOPHEN 325 MG PO TABS
650.0000 mg | ORAL_TABLET | Freq: Once | ORAL | Status: AC
  Administered 2021-04-07: 650 mg via ORAL
  Filled 2021-04-07: qty 2

## 2021-04-07 MED ORDER — CHLORHEXIDINE GLUCONATE CLOTH 2 % EX PADS: 6.0000 | MEDICATED_PAD | Freq: Every day | CUTANEOUS | Status: DC

## 2021-04-07 MED ORDER — KCL IN DEXTROSE-NACL 20-5-0.45 MEQ/L-%-% IV SOLN
Freq: Once | INTRAVENOUS | Status: AC
  Filled 2021-04-07: qty 1000

## 2021-04-07 NOTE — ED Triage Notes (Signed)
Per pt, states he went on a hike this today-states after his hike his head became fuzzy, extremities were tingling, and he had some blurred vision-states episode lasted 20 or so-states symptoms have resolved-states he has a slight headache-states he has a history of Afib and has been in it according to his apple watch-states symptoms did not feel like how he feels when he is in Afib

## 2021-04-07 NOTE — ED Notes (Signed)
Pt ambulatory in ED lobby. 

## 2021-04-07 NOTE — ED Notes (Signed)
Called [facility] at [phone] to request transport to [facility] for neuro care.

## 2021-04-07 NOTE — ED Provider Notes (Addendum)
[facility]-EMERGENCY DEPT Provider Note   CSN: [id number] Arrival date & time: 04/07/21  1139     History Chief Complaint  Patient presents with   Near Syncope    Levi Davis is a 65 y.o. male.  Patient with a spell event at about 1030 this morning that lasted 30 minutes.  It included fine motor control problems with the right hand some numbness tingling to the left hand and blurred visual changes.  Patient has a history of atrial fibrillation.  His apple watch shows that he was in atrial fib at that time.  Patient is not on a blood thinner.  For the atrial fib patient followed by Dr. Ellyn Hack from cardiology he is on atenolol and he is on Multaq.  Patient without any bleeding problems.  Patient is never had anything like the spell event of today.  He still does not feel quite right in the head.  States that arms and legs and vision is all back to normal.  Rate is still irregular and sometimes fast.  Up to the 120s.  Patient's fine motor control in the right hand was off enough that he could not make his cell phone work.      Past Medical History:  Diagnosis Date   A-fib (HCC) 06/23/2018   Arrhythmia 2013   AFIB   Closed right ankle fracture    Hypercholesterolemia     Patient Active Problem List   Diagnosis Date Noted   A-fib [facility]) 06/23/2018   Hypercholesterolemia 06/23/2018   Closed right ankle fracture 06/23/2018    Past Surgical History:  Procedure Laterality Date   broke leg     screws and pins       Family History  Problem Relation Age of Onset   Heart Problems Father        PACER   High blood pressure Father    High blood pressure Mother    Anuerysm Mother    Stroke Mother    Obesity Sister        pacer   Aneurysm Sister    High blood pressure Sister     Social History   Tobacco Use   Smoking status: Never   Smokeless tobacco: Never  Substance Use Topics   Alcohol use: Yes   Drug use: Never    Home Medications Prior to  Admission medications   Medication Sig Start Date End Date Taking? Authorizing Provider  atenolol (TENORMIN) 50 MG tablet Take 0.5 tablets (25 mg total) by mouth daily. 04/01/20   Wendall Stade, MD  Cholecalciferol (VITAMIN D3) 125 MCG (5000 UT) CAPS Take by mouth.    [provider]  co-enzyme Q-10 30 MG capsule Take 30 mg by mouth daily.    [provider]  Cyanocobalamin (VITAMIN B-12 PO) Take 2,500 mcg by mouth daily.    [provider]  dronedarone (MULTAQ) 400 MG tablet TAKE ONE-HALF (1/2) TABLET TWICE A DAY WITH MEALS 03/23/21   Wendall Stade, MD  Krill Oil 1000 MG CAPS Take 2 capsules by mouth 2 (two) times daily.    [provider]  medium chain triglycerides (MCT OIL) oil Take 15 mLs by mouth daily.    [provider]  Multiple Vitamin (MULTIVITAMIN) tablet Take 1 tablet by mouth daily.    [provider]  Omega-3 Fatty Acids (FISH OIL) 1000 MG CAPS Take 2 capsules by mouth daily.    [provider]  Probiotic Product (PROBIOTIC-10) CAPS Take  2-3 capsules by mouth daily.    [provider]  Red Yeast Rice 600 MG TABS Take 1 tablet by mouth 2 (two) times daily.    [provider]    Allergies    Patient has no known allergies.  Review of Systems   Review of Systems  Eyes:  Positive for visual disturbance.  Cardiovascular:  Positive for palpitations.  Neurological:  Positive for weakness and numbness.   Physical Exam Updated Vital Signs BP (!) 144/114   Pulse (!) 109   Temp 98.7 F (37.1 C) (Oral)   Resp 19   Ht 1.829 m (6')   Wt 77.6 kg   SpO2 99%   BMI 23.19 kg/m   Physical Exam Vitals and nursing note reviewed.  Constitutional:      Appearance: Normal appearance. He is well-developed.  HENT:     Head: Normocephalic and atraumatic.  Eyes:     Extraocular Movements: Extraocular movements intact.     Conjunctiva/sclera: Conjunctivae normal.     Pupils: Pupils are equal, round, and  reactive to light.  Cardiovascular:     Rate and Rhythm: Tachycardia present. Rhythm irregular.     Heart sounds: No murmur heard. Pulmonary:     Effort: Pulmonary effort is normal. No respiratory distress.     Breath sounds: Normal breath sounds.  Abdominal:     Palpations: Abdomen is soft.     Tenderness: There is no abdominal tenderness.  Musculoskeletal:        General: No swelling. Normal range of motion.     Cervical back: Normal range of motion and neck supple.  Skin:    General: Skin is warm and dry.  Neurological:     General: No focal deficit present.     Mental Status: He is alert and oriented to person, place, and time.     Cranial Nerves: No cranial nerve deficit.     Sensory: No sensory deficit.     Motor: No weakness.     Coordination: Coordination normal.     Gait: Gait normal.    ED Results / Procedures / Treatments   Labs (all labs ordered are listed, but only abnormal results are displayed) Labs Reviewed  RESP PANEL BY RT-PCR (FLU A&B, COVID) ARPGX2  BASIC METABOLIC PANEL  CBC  MAGNESIUM  DIFFERENTIAL  URINALYSIS, ROUTINE W REFLEX MICROSCOPIC  ETHANOL  PROTIME-INR  APTT  RAPID URINE DRUG SCREEN, HOSP PERFORMED  HEPATIC FUNCTION PANEL    EKG EKG Interpretation  Date/Time:  Tuesday April 07 2021 13:01:02 EDT Ventricular Rate:  110 PR Interval:    QRS Duration: 133 QT Interval:  374 QTC Calculation: 506 R Axis:   -57 Text Interpretation: Atrial fibrillation IVCD, consider atypical RBBB LVH with IVCD and secondary repol abnrm Prolonged QT interval No old tracing to compare Confirmed by Vanetta Mulders [phone]) on 04/07/2021 2:31:00 PM  Radiology CT HEAD WO CONTRAST  Result Date: 04/07/2021 CLINICAL DATA:  Neuro deficit, stroke suspected lightheaded EXAM: CT HEAD WITHOUT CONTRAST TECHNIQUE: Contiguous axial images were obtained from the base of the skull through the vertex without intravenous contrast. COMPARISON:  None. FINDINGS: Brain: No  evidence of acute infarction, hemorrhage, cerebral edema, mass, mass effect, or midline shift. Ventricles and sulci are within normal limits for age. No extra-axial fluid collection. Vascular: No hyperdense vessel or unexpected calcification. Skull: Normal. Negative for fracture or focal lesion. Sinuses/Orbits: No acute finding. Other: The mastoid air cells are well aerated. IMPRESSION: No acute intracranial process.  Electronically Signed   By: Wiliam Ke M.D.   On: 04/07/2021 14:28   DG Chest Port 1 View  Result Date: 04/07/2021 CLINICAL DATA:  Chest pain EXAM: PORTABLE CHEST 1 VIEW COMPARISON:  None. FINDINGS: The cardiomediastinal silhouette is within normal limits. There is no focal consolidation or pulmonary edema. There is no pleural effusion or pneumothorax. There is no acute osseous abnormality. IMPRESSION: No radiographic evidence of acute cardiopulmonary process. Electronically Signed   By: Lesia Hausen M.D.   On: 04/07/2021 13:55    Procedures Procedures   Medications Ordered in ED Medications  aspirin chewable tablet 324 mg (has no administration in time range)  metoprolol tartrate (LOPRESSOR) injection 5 mg (has no administration in time range)    ED Course  I have reviewed the triage vital signs and the nursing notes.  Pertinent labs & imaging results that were available during my care of the patient were reviewed by me and considered in my medical decision making (see chart for details).    MDM Rules/Calculators/A&P                         CRITICAL CARE Performed by: Vanetta Mulders Total critical care time: 45 minutes Critical care time was exclusive of separately billable procedures and treating other patients. Critical care was necessary to treat or prevent imminent or life-threatening deterioration. Critical care was time spent personally by me on the following activities: development of treatment plan with patient and/or surrogate as well as nursing, discussions  with consultants, evaluation of patient's response to treatment, examination of patient, obtaining history from patient or surrogate, ordering and performing treatments and interventions, ordering and review of laboratory studies, ordering and review of radiographic studies, pulse oximetry and re-evaluation of patient's condition.   2 separate issues with the atrial fibs and the concern for TIA.  Both could be related but both need to be addressed separately.  For the atrial for with heart rates going up into the 120s.  Have a call into cardiology to see what they recommend based on the medications as he is on.  But in the meantime he will receive IV Lopressor.  For the concern for TIA stroke order set was done.  Patient had CT head without any acute findings.  Discussed with the neuro hospitalist on-call.  Dr. Thomasena Edis.  He recommended MRI/MRA.  Which has been ordered.  Patient will also be given some aspirin for the concern for TIA.  Patient not on any blood thinners.  No history of any bleed problems.  Patient's renal function is normal.  Cardiology agrees with using Lopressor IV to try to control the atrial fibrillation.  If patient does not convert he will need admission and will be hospitalist admission.  Also patient is going to MRI/MRA to evaluate for the probable TIA.  Also that could lead to admission.  The telemetry neurologist Dr. Thomasena Edis wanted to be called with the MRI results..   Final Clinical Impression(s) / ED Diagnoses Final diagnoses:  TIA (transient ischemic attack)  Atrial fibrillation with [facility])    Rx / DC Orders ED Discharge Orders     None        Vanetta Mulders, MD 04/07/21 1446    Vanetta Mulders, MD 04/07/21 1455    Vanetta Mulders, MD 04/07/21 [phone]

## 2021-04-07 NOTE — ED Notes (Signed)
Notified MD of COVID-positive status

## 2021-04-07 NOTE — H&P (Addendum)
History and Physical    Levi Davis PFX:[id number] DOB: 20-Apr-1956 DOA: 04/07/2021  PCP: Patient, No Pcp Per (Inactive)   Chief Complaint: Levi Davis  HPI: Levi Davis is a 65 y.o. male with medical history significant of hyperlipidemia and paroxysmal A. fib previously with a low CHA2DS2-VASc score not on anticoagulation followed by Dr. Eden Emms who presents with somewhat acute onset general malaise and lightheadedness while hiking earlier this morning.  He also indicates fine motor movement difficulties with his hands bilaterally right greater than left, his wife did indicate he was in A. fib which is somewhat rare for him given his paroxysmal A. fib he does not have a high A. fib burden with low CHA2DS2-VASc per previous documentation thus he is not on anticoagulation.  Regardless he presents today with acute onset of bilateral arm weakness/numbness and persistent A. fib.   ED Course:  Initial work-up in the ED with CTA and MRI showed no acute infarct but patient's MRA was noted to be somewhat abnormal, ED discussed case with neurology Dr. Thomasena Edis who recommended admission to [facility] for CT perfusion scan and further work-up by neurology main campus.  Patient will be admitted to hospitalist service with ED to ED transfer for Dr. Lynelle Doctor so that specialized imaging can be obtained for neurology review and evaluation.  Patient noted to be COVID POSITIVE at the time of admission - likely the provocation of Afib/RVR and possibly thrombus - continue quarantine precautions - no indication for treatment at this time given negative symptoms/findings  Review of Systems: As per HPI including bilateral arm numbness and tingling/weakness otherwise denies nausea vomiting diarrhea constipation headache fevers chills chest pain shortness of breath dysarthria dysphagia.   Assessment/Plan Active Problems:   Transient ischemic attack (TIA)   TIA, acute infarct ruled out Rule out acute basilar  artery thrombus -Bilateral arm numbness and weakness resolved since presentation to the ED -CT head unremarkable for acute process or bleed -MRI unremarkable for acute ischemic infarct -MRA worrisome for basilar artery thrombus given lack of opacification -ED discussed with neurology, plan for perfusion CT at [facility] and further evaluation and treatment per neurology at that campus, in the meantime patient has been loaded with aspirin Plavix and will continue dual antiplatelet therapy per neurology recommendations and to follow-up on that campus -Questionably provoked by covid + status given known history of pro-inflammatory and thrombotic nature.  Paroxysmal A. fib, provoked RVR -Possibly provoked in the setting of covid but unclear if acute/active infection vs incidental/old given lack of symptoms and no recent testing. -Rate well controlled at this time, continue metoprolol 5 IV as needed sustained heart rate greater than 140 -CHA2DS2-VASc low; given paroxysmal A. fib with apparently low burden patient was not previously on anticoagulation -cardiology consulted in the emergency department at [facility], appreciate insight and recommendations  COVID positive status -Incidental - no respiratory symptoms - no indication for treatment at this time. -Likely the provocation of above afib/rvr and possibly thrombus -Will order inflammatory markers/dimer to follow along -May be worth asking ID to perform cycle-count to help differentiate acute vs subacute infection  Hyperlipidemia -Resume statin, would be reasonable to increase high intensity statin dose if findings as above are confirmed   DVT prophylaxis: SCDs only given.  Findings as above, continue aspirin Plavix Code Status: Full  Status is: Inpatient  Dispo: The patient is from: Home              Anticipated d/c is to: Home  Anticipated d/c date is: 24 to 48 hours pending clinical course              Patient currently  not medically stable for discharge given ongoing need for further work-up and imaging with neurology cardiology as above  Consultants:  Neurology, cardiology  Procedures:  None planned   Past Medical History:  Diagnosis Date   A-fib (HCC) 06/23/2018   Arrhythmia 2013   AFIB   Closed right ankle fracture    Hypercholesterolemia     Past Surgical History:  Procedure Laterality Date   broke leg     screws and pins     reports that he has never smoked. He has never used smokeless tobacco. He reports current alcohol use. He reports that he does not use drugs.  No Known Allergies  Family History  Problem Relation Age of Onset   Heart Problems Father        PACER   High blood pressure Father    High blood pressure Mother    Anuerysm Mother    Stroke Mother    Obesity Sister        pacer   Aneurysm Sister    High blood pressure Sister     Prior to Admission medications   Medication Sig Start Date End Date Taking? Authorizing Provider  atenolol (TENORMIN) 50 MG tablet Take 0.5 tablets (25 mg total) by mouth daily. 04/01/20  Yes Wendall Stade, MD  Cholecalciferol (VITAMIN D3) 125 MCG (5000 UT) CAPS Take 5,000 Units by mouth daily.   Yes [provider]  co-enzyme Q-10 30 MG capsule Take 30 mg by mouth daily.   Yes [provider]  Cyanocobalamin (VITAMIN B-12 PO) Take 2,500 mcg by mouth daily.   Yes [provider]  dronedarone (MULTAQ) 400 MG tablet TAKE ONE-HALF (1/2) TABLET TWICE A DAY WITH MEALS Patient taking differently: Take 200 mg by mouth 2 (two) times daily with a meal. 03/23/21  Yes Wendall Stade, MD  Krill Oil 1000 MG CAPS Take 2 capsules by mouth 2 (two) times daily.   Yes [provider]  medium chain triglycerides (MCT OIL) oil Take 15 mLs by mouth daily.   Yes [provider]  Omega-3 Fatty Acids (FISH OIL) 1000 MG CAPS Take 2,000 mg by mouth daily.   Yes [provider]  Probiotic Product  (PROBIOTIC-10) CAPS Take 2-3 capsules by mouth daily.   Yes [provider]    Physical Exam: Vitals:   04/07/21 1415 04/07/21 1430 04/07/21 1500 04/07/21 1704  BP: (!) 131/110 (!) 144/114 135/78 136/65  Pulse: 76 (!) 109 (!) 102 89  Resp: 18 19 19 15   Temp:      TempSrc:      SpO2: 100% 99% 99% 100%  Weight:      Height:        Constitutional: NAD, calm, comfortable Vitals:   04/07/21 1415 04/07/21 1430 04/07/21 1500 04/07/21 1704  BP: (!) 131/110 (!) 144/114 135/78 136/65  Pulse: 76 (!) 109 (!) 102 89  Resp: 18 19 19 15   Temp:      TempSrc:      SpO2: 100% 99% 99% 100%  Weight:      Height:       General:  Pleasantly resting in bed, No acute distress. HEENT:  Normocephalic atraumatic.  Sclerae nonicteric, noninjected.  Extraocular movements intact bilaterally. Neck:  Without mass or deformity.  Trachea is midline. Lungs:  Clear to auscultate bilaterally  without rhonchi, wheeze, or rales. Heart: Irregularly irregular without murmurs, rubs, or gallops. Abdomen:  Soft, nontender, nondistended.  Without guarding or rebound. Extremities: Without cyanosis, clubbing, edema, or obvious deformity. Vascular:  Dorsalis pedis and posterior tibial pulses palpable bilaterally. Skin:  Warm and dry, no erythema, no ulcerations.  Labs on Admission: I have personally reviewed following labs and imaging studies  CBC: Recent Labs  Lab 04/07/21 1333  WBC 7.2  NEUTROABS 5.3  HGB 14.5  HCT 42.4  MCV 95.7  PLT 176   Basic Metabolic Panel: Recent Labs  Lab 04/07/21 1333  NA 139  K 4.1  CL 106  CO2 26  GLUCOSE 92  BUN 19  CREATININE 0.89  CALCIUM 9.4  MG 2.1   GFR: Estimated Creatinine Clearance: 92 mL/min (by C-G formula based on SCr of 0.89 mg/dL). Liver Function Tests: Recent Labs  Lab 04/07/21 1333  AST 21  ALT 17  ALKPHOS 50  BILITOT 1.0  PROT 7.5  ALBUMIN 4.6   No results for input(s): LIPASE, AMYLASE in the last 168 hours. No results for  input(s): AMMONIA in the last 168 hours. Coagulation Profile: Recent Labs  Lab 04/07/21 1451  INR 1.0   Cardiac Enzymes: No results for input(s): CKTOTAL, CKMB, CKMBINDEX, TROPONINI in the last 168 hours. BNP (last 3 results) No results for input(s): PROBNP in the last 8760 hours. HbA1C: No results for input(s): HGBA1C in the last 72 hours. CBG: No results for input(s): GLUCAP in the last 168 hours. Lipid Profile: No results for input(s): CHOL, HDL, LDLCALC, TRIG, CHOLHDL, LDLDIRECT in the last 72 hours. Thyroid Function Tests: No results for input(s): TSH, T4TOTAL, FREET4, T3FREE, THYROIDAB in the last 72 hours. Anemia Panel: No results for input(s): VITAMINB12, FOLATE, FERRITIN, TIBC, IRON, RETICCTPCT in the last 72 hours. Urine analysis:    Component Value Date/Time   COLORURINE COLORLESS (A) 04/07/2021 0215   APPEARANCEUR CLEAR 04/07/2021 0215   LABSPEC 1.003 (L) 04/07/2021 0215   PHURINE 5.0 04/07/2021 0215   GLUCOSEU NEGATIVE 04/07/2021 0215   HGBUR NEGATIVE 04/07/2021 0215   BILIRUBINUR NEGATIVE 04/07/2021 0215   KETONESUR NEGATIVE 04/07/2021 0215   PROTEINUR NEGATIVE 04/07/2021 0215   NITRITE NEGATIVE 04/07/2021 0215   LEUKOCYTESUR NEGATIVE 04/07/2021 0215    Radiological Exams on Admission: CT HEAD WO CONTRAST  Result Date: 04/07/2021 CLINICAL DATA:  Neuro deficit, stroke suspected lightheaded EXAM: CT HEAD WITHOUT CONTRAST TECHNIQUE: Contiguous axial images were obtained from the base of the skull through the vertex without intravenous contrast. COMPARISON:  None. FINDINGS: Brain: No evidence of acute infarction, hemorrhage, cerebral edema, mass, mass effect, or midline shift. Ventricles and sulci are within normal limits for age. No extra-axial fluid collection. Vascular: No hyperdense vessel or unexpected calcification. Skull: Normal. Negative for fracture or focal lesion. Sinuses/Orbits: No acute finding. Other: The mastoid air cells are well aerated. IMPRESSION:  No acute intracranial process. Electronically Signed   By: Wiliam Ke M.D.   On: 04/07/2021 14:28   MR ANGIO HEAD WO CONTRAST  Result Date: 04/07/2021 CLINICAL DATA:  Neuro deficit, stroke suspected EXAM: MRI HEAD WITHOUT CONTRAST MRA HEAD WITHOUT CONTRAST TECHNIQUE: Multiplanar, multi-echo pulse sequences of the brain and surrounding structures were acquired without intravenous contrast. Angiographic images of the Circle of Willis were acquired using MRA technique without intravenous contrast. COMPARISON:  No prior MRI, correlation is made with same day head CT 04/07/2021 FINDINGS: MRI HEAD FINDINGS Brain: No acute infarction, hemorrhage, hydrocephalus, extra-axial collection, or mass lesion. The ventricles  and sulci are within normal limits for age. Scattered T2 hyperintense signal in the left periventricular white matter, suggestive of a watershed distribution, sessile central likely the sequela of mild chronic small vessel ischemic disease. Focus of susceptibility in the right cerebellar hemisphere, likely sequela of prior hypertensive microhemorrhage. Vascular: Normal flow voids. Skull and upper cervical spine: Normal marrow signal. Sinuses/Orbits: Negative. Other: Trace fluid in the mastoid air cells. MRA HEAD FINDINGS Anterior circulation: The left intracranial internal carotid artery is not visualized until the supraclinoid segment. The right intracranial internal carotid artery is patent to the ICA terminus. Diminutive left A1 and M1 segments, compared to the right side. Normal right MCA. Distal MCA branches are perfused bilaterally and grossly symmetric. Visualization of the anterior communicating artery is limited. Posterior circulation: Focal non opacification at the superior extent of the basilar artery (series 19, images 93-99 and series 106, image 146). The left superior cerebellar poor visualization of the right superior cerebellar artery artery origin is patent. Narrowing of the origins of  the bilateral PCAs, with poor flow distally. Diminutive left posterior communicating artery is seen. The right posterior communicating artery is not visualized. Left dominant vertebral system. Poor visualization of the distal right V4, proximal to the vertebrobasilar junction. Patent left vertebral artery. The bilateral PICAs are visualized. Anatomic variants: None significant IMPRESSION: 1. Non opacification of the left intracranial internal carotid artery from the skull base to the supraclinoid segment, of indeterminate acuity. 2. Focal non opacification of the distal basilar artery, concerning for acute thrombus. This results in poor opacification of the right superior cerebellar artery and stenosis of the origin of the bilateral PCAs, with poor opacification of the distal PCAs. 3. Diminutive left A1 and M1 segments, with normal distal ACA and MCA branches. 4. Poor opacification of the distal right vertebral artery. 5. No acute infarct or hemorrhage. These results were called by telephone at the time of interpretation on 04/07/2021 at 5:07 pm to provider Dr. Lynelle Doctor, Who verbally acknowledged these results. Electronically Signed   By: Wiliam Ke M.D.   On: 04/07/2021 17:09   MR Brain Wo Contrast (neuro protocol)  Result Date: 04/07/2021 CLINICAL DATA:  Neuro deficit, stroke suspected EXAM: MRI HEAD WITHOUT CONTRAST MRA HEAD WITHOUT CONTRAST TECHNIQUE: Multiplanar, multi-echo pulse sequences of the brain and surrounding structures were acquired without intravenous contrast. Angiographic images of the Circle of Willis were acquired using MRA technique without intravenous contrast. COMPARISON:  No prior MRI, correlation is made with same day head CT 04/07/2021 FINDINGS: MRI HEAD FINDINGS Brain: No acute infarction, hemorrhage, hydrocephalus, extra-axial collection, or mass lesion. The ventricles and sulci are within normal limits for age. Scattered T2 hyperintense signal in the left periventricular white  matter, suggestive of a watershed distribution, sessile central likely the sequela of mild chronic small vessel ischemic disease. Focus of susceptibility in the right cerebellar hemisphere, likely sequela of prior hypertensive microhemorrhage. Vascular: Normal flow voids. Skull and upper cervical spine: Normal marrow signal. Sinuses/Orbits: Negative. Other: Trace fluid in the mastoid air cells. MRA HEAD FINDINGS Anterior circulation: The left intracranial internal carotid artery is not visualized until the supraclinoid segment. The right intracranial internal carotid artery is patent to the ICA terminus. Diminutive left A1 and M1 segments, compared to the right side. Normal right MCA. Distal MCA branches are perfused bilaterally and grossly symmetric. Visualization of the anterior communicating artery is limited. Posterior circulation: Focal non opacification at the superior extent of the basilar artery (series 19, images 93-99 and series 106,  image 146). The left superior cerebellar poor visualization of the right superior cerebellar artery artery origin is patent. Narrowing of the origins of the bilateral PCAs, with poor flow distally. Diminutive left posterior communicating artery is seen. The right posterior communicating artery is not visualized. Left dominant vertebral system. Poor visualization of the distal right V4, proximal to the vertebrobasilar junction. Patent left vertebral artery. The bilateral PICAs are visualized. Anatomic variants: None significant IMPRESSION: 1. Non opacification of the left intracranial internal carotid artery from the skull base to the supraclinoid segment, of indeterminate acuity. 2. Focal non opacification of the distal basilar artery, concerning for acute thrombus. This results in poor opacification of the right superior cerebellar artery and stenosis of the origin of the bilateral PCAs, with poor opacification of the distal PCAs. 3. Diminutive left A1 and M1 segments, with  normal distal ACA and MCA branches. 4. Poor opacification of the distal right vertebral artery. 5. No acute infarct or hemorrhage. These results were called by telephone at the time of interpretation on 04/07/2021 at 5:07 pm to provider Dr. Lynelle Doctor, Who verbally acknowledged these results. Electronically Signed   By: Wiliam Ke M.D.   On: 04/07/2021 17:09   DG Chest Port 1 View  Result Date: 04/07/2021 CLINICAL DATA:  Chest pain EXAM: PORTABLE CHEST 1 VIEW COMPARISON:  None. FINDINGS: The cardiomediastinal silhouette is within normal limits. There is no focal consolidation or pulmonary edema. There is no pleural effusion or pneumothorax. There is no acute osseous abnormality. IMPRESSION: No radiographic evidence of acute cardiopulmonary process. Electronically Signed   By: Lesia Hausen M.D.   On: 04/07/2021 13:55    EKG: Independently reviewed.  A. fib with moderate LVH   Azucena Fallen DO [facility] For contact please use secure messenger on Epic  If 7PM-7AM, please contact night-coverage located on [email or web address]   04/07/2021, 5:51 PM

## 2021-04-07 NOTE — Progress Notes (Signed)
ANTICOAGULATION CONSULT NOTE - Initial Consult  Pharmacy Consult for Heparin Indication: atrial fibrillation  No Known Allergies  Patient Measurements: Height: 6' (182.9 cm) Weight: 77.6 kg (171 lb) IBW/kg (Calculated) : 77.6 Heparin Dosing Weight: total weight  Vital Signs: Temp: 98.7 F (37.1 C) (10/11 1152) Temp Source: Oral (10/11 1152) BP: 136/112 (10/11 1900) Pulse Rate: 66 (10/11 1900)  Labs: Recent Labs    04/07/21 1333 04/07/21 1451  HGB 14.5  --   HCT 42.4  --   PLT 176  --   APTT  --  27  LABPROT  --  13.1  INR  --  1.0  CREATININE 0.89  --     Estimated Creatinine Clearance: 92 mL/min (by C-G formula based on SCr of 0.89 mg/dL).   Medical History: Past Medical History:  Diagnosis Date   A-fib (HCC) 06/23/2018   Arrhythmia 2013   AFIB   Closed right ankle fracture    Hypercholesterolemia     Medications:  Scheduled:   [START ON 04/08/2021] aspirin  81 mg Oral Daily   [START ON 04/08/2021] clopidogrel  75 mg Oral Daily   heparin  3,500 Units Intravenous Once    morphine injection  4 mg Intravenous Once   Infusions:   heparin     PRN: metoprolol tartrate  Assessment: 65 yo male with hx afib who presents with TIA, no acute infarct - plan aspirin/plavix.  Patient with Afib/RVR and Pharmacy consulted to dose IV heparin, ok to bolus per discussion with Neurologist Dr. Derry Lory. CBC, renal function and baseline aPTT, PT/INR all WNL.  No prior anticoagulants PTA.  Goal of Therapy:  Heparin level 0.3-0.7 units/ml Monitor platelets by anticoagulation protocol: Yes   Plan:  Give 3500 units bolus x 1 Start heparin infusion at 1100 units/hr Check anti-Xa level in 6 hours and daily while on heparin Continue to monitor H&H and platelets  Loralee Pacas, PharmD, BCPS Pharmacy: [phone] 04/07/2021,7:28 PM

## 2021-04-07 NOTE — ED Notes (Signed)
Pt transported to MRI 

## 2021-04-07 NOTE — ED Provider Notes (Signed)
Emergency Medicine Provider Triage Evaluation Note  Levi Davis , a 65 y.o. male  was evaluated in triage.  Pt complains of A. fib.  Patient states that he has had A. fib for approximately 10 years.  He is on dronedarone states he takes half a tab in the morning half in the evening.  He is also on atenolol.  He states that he took all of his medications as prescribed today.  He states that he has not ever been prescribed a blood thinner he is currently not taking Eliquis Xarelto or aspirin.  Did take an aspirin today.  States that he was hiking earlier today felt somewhat lightheaded and had some issues with fine motor movements in his hands which at his watch and realized that he had gone into A. fib.  He states that this is not something that commonly occurs for him.  Review of Systems  Positive: Lightheaded, A. fib Negative: Fever, chest pain  Physical Exam  BP 136/84 (BP Location: Left Arm)   Pulse 87   Temp 98.7 F (37.1 C) (Oral)   Resp 18   Ht 6' (1.829 m)   Wt 77.6 kg   SpO2 100%   BMI 23.19 kg/m  Gen:   Awake, no distress   Resp:  Normal effort  MSK:   Moves extremities without difficulty  Other:  Irregular rhythm palpated.  Medical Decision Making  Medically screening exam initiated at 1:11 PM.  Appropriate orders placed.  Levi Davis was informed that the remainder of the evaluation will be completed by another provider, this initial triage assessment does not replace that evaluation, and the importance of remaining in the ED until their evaluation is complete.  In atrial fibrillation.  Intermittently tachycardic.  Not anticoagulated  Discussed with triage RN will be placed in room expediently.   Levi Davis, [place] 04/07/21 1313    [name], MD 04/08/21 [phone]

## 2021-04-07 NOTE — ED Provider Notes (Addendum)
Patient was initially seen by Dr. Deretha Emory.  Please see his note.  Plan was for admission but ultimate destination will depend on his imaging results.  The patient's MRI does not show a stroke but he has concerning findings on the angiogram.  Patient remains asymptomatic right now other than mild headache.  Was discussed with Dr. Thomasena Edis.  Patient will be admitted to [facility] for CT perfusion.  I will start the patient on aspirin and Plavix.  I will consult with the ED to arrange for ED to ED transfer.  Patient will be admitted by the hospitalist service..  Case discussed with Dr Criss Alvine.   Linwood Dibbles, MD 04/07/21 Fransisco Hertz    Linwood Dibbles, MD 04/07/21 [phone]

## 2021-04-07 NOTE — H&P (Signed)
NEUROLOGY CONSULTATION NOTE   Date of service: April 07, 2021 Patient Name: Levi Davis MRN:  [id number] DOB:  Jan 01, 1956 _ _ _   _ __   _ __ _ _  __ __   _ __   __ _  History of Present Illness  Levi Davis is a 65 y.o. male with PMH significant for pAfibb not on [facility] for low CHADs2-Vasc score, HLD who presented with BL arm weakness and numbness and in persistent Afibb. He finished a 3 mile hike in the morning with his dog and when he got back to his truck, had an episode of cross eyes with R hand clumsiness with trouble with fine motor skills and left hand numbness. This lasted 10-12 mins and resolved.  He spoke to his wife and came in to the ED.  MRI Brain w/o contrast was negative for an acute stroke. MRA head demonstrated concerns for acute distal basilar thrombosis and CTA demonstrated basilar thrombus at the tip of the basilar artery. BL PCA P1 segments are patent.  mRS: 0 tNK/Thrombectomy: not offered at this time due to resolution of symptoms. The goal of intervention is resolution of symptoms and he does not have any symptoms to begin with at this time. However, will closely monitoring in the Neuro ICU for any recurrence of symptoms or in case he develops any new symptoms.  NIHSS components Score: Comment  1a Level of Conscious 0[x]  1[]  2[]  3[]      1b LOC Questions 0[x]  1[]  2[]       1c LOC Commands 0[x]  1[]  2[]       2 Best Gaze 0[x]  1[]  2[]       3 Visual 0[x]  1[]  2[]  3[]      4 Facial Palsy 0[x]  1[]  2[]  3[]      5a Motor Arm - left 0[x]  1[]  2[]  3[]  4[]  UN[]    5b Motor Arm - Right 0[x]  1[]  2[]  3[]  4[]  UN[]    6a Motor Leg - Left 0[x]  1[]  2[]  3[]  4[]  UN[]    6b Motor Leg - Right 0[x]  1[]  2[]  3[]  4[]  UN[]    7 Limb Ataxia 0[x]  1[]  2[]  3[]  UN[]     8 Sensory 0[x]  1[]  2[]  UN[]      9 Best Language 0[x]  1[]  2[]  3[]      10 Dysarthria 0[x]  1[]  2[]  UN[]      11 Extinct. and Inattention 0[x]  1[]  2[]       TOTAL: 0     ROS   Constitutional Denies weight loss, fever and chills.   HEENT  Denies changes in vision and hearing.   Respiratory Denies SOB and cough.   CV Denies palpitations and CP   GI Denies abdominal pain, nausea, vomiting and diarrhea.   GU Denies dysuria and urinary frequency.   MSK Denies myalgia and joint pain.   Skin Denies rash and pruritus.   Neurological Denies headache and syncope.   Psychiatric Denies recent changes in mood. Denies anxiety and depression.    Past History   Past Medical History:  Diagnosis Date   A-fib (HCC) 06/23/2018   Arrhythmia 2013   AFIB   Closed right ankle fracture    Hypercholesterolemia    Past Surgical History:  Procedure Laterality Date   broke leg     screws and pins   Family History  Problem Relation Age of Onset   Heart Problems Father        PACER   High blood pressure Father    High blood pressure Davis  Levi Davis    Stroke Davis    Obesity Sister        pacer   Aneurysm Sister    High blood pressure Sister    Social History   Socioeconomic History   Marital status: Married    Spouse name: Not on file   Number of children: Not on file   Years of education: Not on file   Highest education level: Not on file  Occupational History   Not on file  Tobacco Use   Smoking status: Never   Smokeless tobacco: Never  Vaping Use   Vaping Use: Never used  Substance and Sexual Activity   Alcohol use: Yes    Comment: occasional   Drug use: Never   Sexual activity: Not on file  Other Topics Concern   Not on file  Social History Narrative   Not on file   Social Determinants of Health   Financial Resource Strain: Not on file  Food Insecurity: Not on file  Transportation Needs: Not on file  Physical Activity: Not on file  Stress: Not on file  Social Connections: Not on file   No Known Allergies  Medications   Medications Prior to Admission  Medication Sig Dispense Refill Last Dose   atenolol (TENORMIN) 50 MG tablet Take 0.5 tablets (25 mg total) by mouth daily. 90 tablet 2  04/06/2021   Cholecalciferol (VITAMIN D3) 125 MCG (5000 UT) CAPS Take 5,000 Units by mouth daily.   04/07/2021   co-enzyme Q-10 30 MG capsule Take 30 mg by mouth daily.   04/07/2021   Cyanocobalamin (VITAMIN B-12 PO) Take 2,500 mcg by mouth daily.   04/07/2021   dronedarone (MULTAQ) 400 MG tablet TAKE ONE-HALF (1/2) TABLET TWICE A DAY WITH MEALS (Patient taking differently: Take 200 mg by mouth 2 (two) times daily with a meal.) 30 tablet 0 04/07/2021   Krill Oil 1000 MG CAPS Take 2 capsules by mouth 2 (two) times daily.   04/07/2021   medium chain triglycerides (MCT OIL) oil Take 15 mLs by mouth daily.   Past Week   Omega-3 Fatty Acids (FISH OIL) 1000 MG CAPS Take 2,000 mg by mouth daily.   04/07/2021   Probiotic Product (PROBIOTIC-10) CAPS Take 2-3 capsules by mouth daily.   04/06/2021     Vitals   Vitals:   04/07/21 1704 04/07/21 1830 04/07/21 1900 04/07/21 2030  BP: 136/65 (!) 140/98 (!) 136/112 (!) 135/92  Pulse: 89 76 66 92  Resp: 15 19 17 17   Temp:      TempSrc:      SpO2: 100% 99% 100% 100%  Weight:      Height:         Body mass index is 23.19 kg/m.  Physical Exam   General: Laying comfortably in bed; in no acute distress.  HENT: Normal oropharynx and mucosa. Normal external appearance of ears and nose.  Neck: Supple, no pain or tenderness  CV: No JVD. No peripheral edema.  Pulmonary: Symmetric Chest rise. Normal respiratory effort.  Abdomen: Soft to touch, non-tender.  Ext: No cyanosis, edema, or deformity  Skin: No rash. Normal palpation of skin.   Musculoskeletal: Normal digits and nails by inspection. No clubbing.   Neurologic Examination  Mental status/Cognition: Alert, oriented to self, place, month and year, good attention.  Speech/language: Fluent, comprehension intact, object naming intact, repetition intact.  Cranial nerves:   CN II Pupils equal and reactive to light, no VF deficits    CN III,IV,VI  EOM intact, no gaze preference or deviation, no  nystagmus    CN V normal sensation in V1, V2, and V3 segments bilaterally    CN VII no asymmetry, no nasolabial fold flattening    CN VIII normal hearing to speech    CN IX & X normal palatal elevation, no uvular deviation    CN XI 5/5 head turn and 5/5 shoulder shrug bilaterally    CN XII midline tongue protrusion    Motor:  Muscle bulk: normal, tone normal, pronator drift none tremor none Mvmt Root Nerve  Muscle Right Left Comments  SA C5/6 Ax Deltoid 5 5   EF C5/6 Mc Biceps 5 5   EE C6/7/8 Rad Triceps 5 5   WF C6/7 Med FCR     WE C7/8 PIN ECU     F Ab C8/T1 U ADM/FDI 5 5   HF L1/2/3 Fem Illopsoas 5 5   KE L2/3/4 Fem Quad 5 5   DF L4/5 D Peron Tib Ant 5 5   PF S1/2 Tibial Grc/Sol 5 5    Reflexes:  Right Left Comments  Pectoralis      Biceps (C5/6) 2 2   Brachioradialis (C5/6) 2 2    Triceps (C6/7) 2 2    Patellar (L3/4) 2 2    Achilles (S1)      Hoffman      Plantar     Jaw jerk    Sensation:  Light touch intact   Pin prick    Temperature    Vibration   Proprioception    Coordination/Complex Motor:  - Finger to Nose intact BL - Heel to shin intact BL - Rapid alternating movement normal - Gait: Stride length normal. Arm swing normal. Base width narrow.  Labs   CBC:  Recent Labs  Lab 04/07/21 1333  WBC 7.2  NEUTROABS 5.3  HGB 14.5  HCT 42.4  MCV 95.7  PLT 176    Basic Metabolic Panel:  Lab Results  Component Value Date   NA 139 04/07/2021   K 4.1 04/07/2021   CO2 26 04/07/2021   GLUCOSE 92 04/07/2021   BUN 19 04/07/2021   CREATININE 0.89 04/07/2021   CALCIUM 9.4 04/07/2021   GFRNONAA >60 04/07/2021   Lipid Panel: No results found for: LDLCALC HgbA1c: No results found for: HGBA1C Urine Drug Screen:     Component Value Date/Time   LABOPIA NONE DETECTED 04/07/2021 1428   COCAINSCRNUR NONE DETECTED 04/07/2021 1428   LABBENZ NONE DETECTED 04/07/2021 1428   AMPHETMU NONE DETECTED 04/07/2021 1428   THCU NONE DETECTED 04/07/2021 1428    LABBARB NONE DETECTED 04/07/2021 1428    Alcohol Level     Component Value Date/Time   ETH <10 04/07/2021 1333    CT Head without contrast: Personallly reviewed and CTH was negative for a large hypodensity concerning for a large territory infarct or hyperdensity concerning for an ICH  CT angio Head and Neck with contrast: Personally reviewed and Left ICA occlusion from origin to distal cavernous segment. Distal basilar non occlusive thrombus with patent BL PCAs.  MRI Brain: Personally reviewed and no acute stroke or other significant intracranial abnormality.  Impression   Levi Davis is a 65 y.o. male with PMH significant for pAfibb not on AC for low CHADs2-Vasc score, HLD who presented with 10-12 mins episode of cross eyes, R hand clumsiness and L arm numbness that self resolved. He was found to have a non occlusive distal basilar thrombus with patent BL  PCAs.  Impression: - Basilar artery thrombosis - P Afibb. - Covid 19 positive  Recommendations   Basilar artery thrombosis: - Q1 hours Neuro checks in the NCCU. - Anticoagulation with Heparin gtt. - TTE pending. - LDL pending. - Will start statin if LDL > 70 - HbA1c pending. - SBP goal - permissive hypertension first 24 h < 220/110. Held home meds.  - Telemetry monitoring for arrythmia - Bedside swallow screen prior to PO intake. - Stroke education booklet - Recommend PT/OT/SLP consult  pAfibb: - Metoprolol IV 5mg  Q6H PRN for HR> 140. - Anticoagulation as above.  ________________________________________________  Plan discussed with patient at bedside and with his wife on the phone.   This patient is critically ill and at significant risk of neurological worsening, death and care requires constant monitoring of vital signs, hemodynamics,respiratory and cardiac monitoring, neurological assessment, discussion with family, other specialists and medical decision making of high complexity. I spent 40 minutes of  neurocritical care time  in the care of  this patient. This was time spent independent of any time provided by nurse practitioner or PA.  [facility] Pager Number [phone] [phone] 04/07/2021  10:10 PM   Thank you for the opportunity to take part in the care of this patient. If you have any further questions, please contact the neurology consultation attending.  Signed,  06/07/2021 [facility] Pager Number Erick Blinks _ _ _   _ __   _ __ _ _  __ __   _ __   __ _

## 2021-04-07 NOTE — ED Notes (Signed)
Patient transported to CT 

## 2021-04-08 ENCOUNTER — Inpatient Hospital Stay (HOSPITAL_COMMUNITY)

## 2021-04-08 ENCOUNTER — Ambulatory Visit: Admitting: Cardiovascular Disease

## 2021-04-08 ENCOUNTER — Other Ambulatory Visit

## 2021-04-08 DIAGNOSIS — I6389 Other cerebral infarction: Secondary | ICD-10-CM

## 2021-04-08 DIAGNOSIS — I651 Occlusion and stenosis of basilar artery: Secondary | ICD-10-CM

## 2021-04-08 LAB — HIV ANTIBODY (ROUTINE TESTING W REFLEX): HIV Screen 4th Generation wRfx: NONREACTIVE

## 2021-04-08 LAB — CBC
HCT: 41.8 % (ref 39.0–52.0)
Hemoglobin: 14.3 g/dL (ref 13.0–17.0)
MCH: 32.4 pg (ref 26.0–34.0)
MCHC: 34.2 g/dL (ref 30.0–36.0)
MCV: 94.6 fL (ref 80.0–100.0)
Platelets: 183 10*3/uL (ref 150–400)
RBC: 4.42 MIL/uL (ref 4.22–5.81)
RDW: 11.9 % (ref 11.5–15.5)
WBC: 5.7 10*3/uL (ref 4.0–10.5)
nRBC: 0 % (ref 0.0–0.2)

## 2021-04-08 LAB — LIPID PANEL
Cholesterol: 222 mg/dL — ABNORMAL HIGH (ref 0–200)
HDL: 76 mg/dL (ref >40)
LDL Cholesterol: 141 mg/dL — ABNORMAL HIGH (ref 0–99)
Total CHOL/HDL Ratio: 2.9 RATIO
Triglycerides: 24 mg/dL (ref <150)
VLDL: 5 mg/dL (ref 0–40)

## 2021-04-08 LAB — ECHOCARDIOGRAM COMPLETE
Area-P 1/2: 2.8 cm2
Calc EF: 14.4 %
Height: 72 in
S' Lateral: 3.5 cm
Single Plane A2C EF: 8.1 %
Single Plane A4C EF: 56.3 %
Weight: 2736 oz

## 2021-04-08 LAB — HEPARIN LEVEL (UNFRACTIONATED): Heparin Unfractionated: 0.38 IU/mL (ref 0.30–0.70)

## 2021-04-08 LAB — HEMOGLOBIN A1C
Hgb A1c MFr Bld: 5.1 % (ref 4.8–5.6)
Mean Plasma Glucose: 99.67 mg/dL

## 2021-04-08 MED ORDER — ATORVASTATIN CALCIUM 80 MG PO TABS
80.0000 mg | ORAL_TABLET | Freq: Every day | ORAL | Status: DC
  Filled 2021-04-08 (×2): qty 1

## 2021-04-08 MED ORDER — APIXABAN 5 MG PO TABS
5.0000 mg | ORAL_TABLET | Freq: Two times a day (BID) | ORAL | Status: DC
  Administered 2021-04-08 – 2021-04-09 (×3): 5 mg via ORAL
  Filled 2021-04-08 (×3): qty 1

## 2021-04-08 NOTE — Progress Notes (Signed)
Occupational Therapy Discharge Patient Details Name: Levi Davis MRN: [id number] DOB: 10-25-1955 Today's Date: 04/08/2021 Time:  -     Patient discharged from OT services secondary to  NIH 0 at this time. Pt at or near baseline. Pt observed reaching for feet, using fine motor to [employer], walking around the room with RN staff and helping manage his lines showing awareness. RN agrees no needs currently .  Please see latest therapy progress note for current level of functioning and progress toward goals.    Progress and discharge plan discussed with patient and/or caregiver: Patient/Caregiver agrees with plan  GO     Wynona Neat, OTR/L  Acute Rehabilitation Services Pager: [phone] Office: [phone] .  04/08/2021, 8:57 AM

## 2021-04-08 NOTE — Progress Notes (Signed)
PROGRESS NOTE    Levi Davis  DVV:[id number] DOB: 07-23-1955 DOA: 04/07/2021 PCP: Patient, No Pcp Per (Inactive)   Brief Narrative:  Levi Davis is a 65 y.o. male with medical history significant of hyperlipidemia and paroxysmal A. fib previously with a low CHA2DS2-VASc score not on anticoagulation followed by Dr. Eden Emms who presents with somewhat acute onset general malaise and lightheadedness while hiking earlier this morning.  He also indicates fine motor movement difficulties with his hands bilaterally right greater than left, his wife did indicate he was in A. fib which is somewhat rare for him given his paroxysmal A. fib he does not have a high A. fib burden with low CHA2DS2-VASc per previous documentation thus he is not on anticoagulation.  Regardless he presents today with acute onset of bilateral arm weakness/numbness and persistent A. fib.   Assessment & Plan:    TIA, acute infarct ruled out Rule out acute basilar artery thrombus -Bilateral arm numbness and weakness resolved since presentation to the ED -CT head unremarkable for acute process or bleed -MRI unremarkable for acute ischemic infarct -MRA worrisome for basilar artery thrombus given lack of opacification -Neurology following, appreciate insight and recommendations, will initiate Eliquis per their recommendations for A. fib and concerning findings on MRA -Unfortunately patient and wife not agreeable to continue all medications as requested by neurology most notably including statin despite discussion on improved outcomes.  We will continue to recommend initiating this medication, possibly discussing with patient's PCP and outpatient cardiologist will be useful.   Paroxysmal A. fib, provoked RVR -Possibly provoked in the setting of covid but unclear if acute/active infection vs incidental finding given lack of symptoms and no recent testing. -Patient was presumed positive for COVID in April of this year but concurrent  prolonged positive testing 6 months out would be quite rare -Rate well controlled at this time, continue metoprolol 5 IV as needed sustained heart rate greater than 140 -CHA2DS2-VASc low; given paroxysmal A. fib with apparently low burden patient was not previously on anticoagulation - cardiology consulted in the emergency department at [facility], appreciate insight and recommendations   COVID positive status -Incidental - no respiratory symptoms - no indication for treatment at this time -Questionably provocation of above afib/rvr and possibly thrombus -Inflammatory markers essentially WNL   Hyperlipidemia -Resume statin, would be reasonable to increase high intensity statin dose if findings as above are confirmed  DVT prophylaxis: Eliquis Code Status: Full Family Communication: Wife at bedside  Status is: Inpt  Dispo: The patient is from: Home              Anticipated d/c is to: Home              Anticipated d/c date is: 24h              Patient currently is medically stable for discharge  Consultants:  Neurology  Procedures:  None  Antimicrobials:  None   Subjective: No acute issues/events overnight  Objective: Vitals:   04/08/21 0500 04/08/21 0600 04/08/21 0700 04/08/21 0800  BP: 108/73 102/78 111/80 107/71  Pulse: 75  (!) 55 (!) 52  Resp: 17  18 15   Temp:      TempSrc:      SpO2: 96%  97% 98%  Weight:      Height:        Intake/Output Summary (Last 24 hours) at 04/08/2021 0829 Last data filed at 04/08/2021 0500 Gross per 24 hour  Intake 76.83 ml  Output 870  ml  Net -793.17 ml   Filed Weights   04/07/21 1152  Weight: 77.6 kg    Examination:  General exam: Appears calm and comfortable  Respiratory system: Clear to auscultation. Respiratory effort normal. Cardiovascular system: S1 & S2 heard, RRR. No JVD, murmurs, rubs, gallops or clicks. No pedal edema. Gastrointestinal system: Abdomen is nondistended, soft and nontender. No organomegaly or masses  felt. Normal bowel sounds heard. Central nervous system: Alert and oriented. No focal neurological deficits. Extremities: Symmetric 5 x 5 power. Skin: No rashes, lesions or ulcers Psychiatry: Judgement and insight appear normal. Mood & affect appropriate.     Data Reviewed: I have personally reviewed following labs and imaging studies  CBC: Recent Labs  Lab 04/07/21 1333 04/08/21 0225  WBC 7.2 5.7  NEUTROABS 5.3  --   HGB 14.5 14.3  HCT 42.4 41.8  MCV 95.7 94.6  PLT 176 183   Basic Metabolic Panel: Recent Labs  Lab 04/07/21 1333  NA 139  K 4.1  CL 106  CO2 26  GLUCOSE 92  BUN 19  CREATININE 0.89  CALCIUM 9.4  MG 2.1   GFR: Estimated Creatinine Clearance: 92 mL/min (by C-G formula based on SCr of 0.89 mg/dL). Liver Function Tests: Recent Labs  Lab 04/07/21 1333  AST 21  ALT 17  ALKPHOS 50  BILITOT 1.0  PROT 7.5  ALBUMIN 4.6   No results for input(s): LIPASE, AMYLASE in the last 168 hours. No results for input(s): AMMONIA in the last 168 hours. Coagulation Profile: Recent Labs  Lab 04/07/21 1451  INR 1.0   Cardiac Enzymes: No results for input(s): CKTOTAL, CKMB, CKMBINDEX, TROPONINI in the last 168 hours. BNP (last 3 results) No results for input(s): PROBNP in the last 8760 hours. HbA1C: Recent Labs    04/08/21 0225  HGBA1C 5.1   CBG: No results for input(s): GLUCAP in the last 168 hours. Lipid Profile: Recent Labs    04/08/21 0225  CHOL 222*  HDL 76  LDLCALC 141*  TRIG 24  CHOLHDL 2.9   Thyroid Function Tests: No results for input(s): TSH, T4TOTAL, FREET4, T3FREE, THYROIDAB in the last 72 hours. Anemia Panel: Recent Labs    04/07/21 1912  FERRITIN 51   Sepsis Labs: No results for input(s): PROCALCITON, LATICACIDVEN in the last 168 hours.  Recent Results (from the past 240 hour(s))  Resp Panel by RT-PCR (Flu A&B, Covid) Nasopharyngeal Swab     Status: Abnormal   Collection Time: 04/07/21  2:51 PM   Specimen: Nasopharyngeal  Swab; Nasopharyngeal(NP) swabs in vial transport medium  Result Value Ref Range Status   SARS Coronavirus 2 by RT PCR POSITIVE (A) NEGATIVE Final    Comment: RESULT CALLED TO, READ BACK BY AND VERIFIED WITH: CHANY,A RN @1804  ON 04/07/21 JACKSON,K (NOTE) SARS-CoV-2 target nucleic acids are DETECTED.  The SARS-CoV-2 RNA is generally detectable in upper respiratory specimens during the acute phase of infection. Positive results are indicative of the presence of the identified virus, but do not rule out bacterial infection or co-infection with other pathogens not detected by the test. Clinical correlation with patient history and other diagnostic information is necessary to determine patient infection status. The expected result is Negative.  Fact Sheet for Patients: 06/07/21  Fact Sheet for Healthcare Providers: BloggerCourse.com  This test is not yet approved or cleared by the [employer] FDA and  has been authorized for detection and/or diagnosis of SARS-CoV-2 by FDA under an Emergency Use Authorization (EUA).  This EUA will  remain in effect (meaning this test c an be used) for the duration of  the COVID-19 declaration under Section 564(b)(1) of the Act, 21 U.S.C. section 360bbb-3(b)(1), unless the authorization is terminated or revoked sooner.     Influenza A by PCR NEGATIVE NEGATIVE Final   Influenza B by PCR NEGATIVE NEGATIVE Final    Comment: (NOTE) The Xpert Xpress SARS-CoV-2/FLU/RSV plus assay is intended as an aid in the diagnosis of influenza from Nasopharyngeal swab specimens and should not be used as a sole basis for treatment. Nasal washings and aspirates are unacceptable for Xpert Xpress SARS-CoV-2/FLU/RSV testing.  Fact Sheet for Patients: BloggerCourse.com  Fact Sheet for Healthcare Providers: SeriousBroker.it  This test is not yet approved or cleared  by the [place] FDA and has been authorized for detection and/or diagnosis of SARS-CoV-2 by FDA under an Emergency Use Authorization (EUA). This EUA will remain in effect (meaning this test can be used) for the duration of the COVID-19 declaration under Section 564(b)(1) of the Act, 21 U.S.C. section 360bbb-3(b)(1), unless the authorization is terminated or revoked.  Performed at [facility], [address] [place], [place] [address]   MRSA Next Gen by PCR, Nasal     Status: None   Collection Time: 04/07/21  9:44 PM   Specimen: Nasal Mucosa; Nasal Swab  Result Value Ref Range Status   MRSA by PCR Next Gen NOT DETECTED NOT DETECTED Final    Comment: (NOTE) The GeneXpert MRSA Assay (FDA approved for NASAL specimens only), is one component of a comprehensive MRSA colonization surveillance program. It is not intended to diagnose MRSA infection nor to guide or monitor treatment for MRSA infections. Test performance is not FDA approved in patients less than 58 years old. Performed at [facility], [address] [place], [place] [address]          Radiology Studies: CT ANGIO HEAD NECK W WO CM  Result Date: 04/07/2021 CLINICAL DATA:  Fine motor control problems with the right hand some numbness tingling to the left hand and blurred visual changes. Patient has a history of atrial fibrillation EXAM: CT ANGIOGRAPHY HEAD AND NECK TECHNIQUE: Multidetector CT imaging of the head and neck was performed using the standard protocol during bolus administration of intravenous contrast. Multiplanar CT image reconstructions and MIPs were obtained to evaluate the vascular anatomy. Carotid stenosis measurements (when applicable) are obtained utilizing NASCET criteria, using the distal internal carotid diameter as the denominator. CONTRAST:  5mL OMNIPAQUE IOHEXOL 350 MG/ML SOLN COMPARISON:  None. FINDINGS: CTA NECK FINDINGS SKELETON: There is no bony spinal canal  stenosis. No lytic or blastic lesion. OTHER NECK: Normal pharynx, larynx and major salivary glands. No cervical lymphadenopathy. Unremarkable thyroid gland. UPPER CHEST: No pneumothorax or pleural effusion. No nodules or masses. AORTIC ARCH: There is no calcific atherosclerosis of the aortic arch. There is no aneurysm, dissection or hemodynamically significant stenosis of the visualized portion of the aorta. Conventional 3 vessel aortic branching pattern. The visualized proximal subclavian arteries are widely patent. RIGHT CAROTID SYSTEM: Normal without aneurysm, dissection or stenosis. LEFT CAROTID SYSTEM: The left ICA is occluded at its origin and remains occluded to the distal cavernous segment. VERTEBRAL ARTERIES: Left dominant configuration. Both origins are clearly patent. There is no dissection, occlusion or flow-limiting stenosis to the skull base (V1-V3 segments). CTA HEAD FINDINGS POSTERIOR CIRCULATION: --Vertebral arteries: Mild atherosclerotic calcification on the left without stenosis. --Inferior cerebellar arteries: Normal. --Basilar artery: There is thrombosis of the most distal  aspect of the basilar artery. Both PCA P1 segments remain opacified, suggesting that the thrombosis is nonocclusive. --Superior cerebellar arteries: Normal. --Posterior cerebral arteries (PCA): Normal. ANTERIOR CIRCULATION: --Intracranial internal carotid arteries: Left is occluded proximally with reconstitution at the distal cavernous segment. The right is normal aside from mild atherosclerotic calcification. --Anterior cerebral arteries (ACA): Normal. Both A1 segments are present. Patent anterior communicating artery (a-comm). --Middle cerebral arteries (MCA): Normal. VENOUS SINUSES: As permitted by contrast timing, patent. ANATOMIC VARIANTS: None Review of the MIP images confirms the above findings. IMPRESSION: 1. Occlusion of the left internal carotid artery from its origin to the distal cavernous segment. 2. Occlusion of  the most distal aspect of the basilar artery. Both PCA P1 segments remain opacified, suggesting that the thrombosis is nonocclusive. No visible posterior communicating artery. 3. No other intracranial arterial occlusion or high-grade stenosis. Electronically Signed   By: Deatra Robinson M.D.   On: 04/07/2021 20:33   CT HEAD WO CONTRAST  Result Date: 04/07/2021 CLINICAL DATA:  Neuro deficit, stroke suspected lightheaded EXAM: CT HEAD WITHOUT CONTRAST TECHNIQUE: Contiguous axial images were obtained from the base of the skull through the vertex without intravenous contrast. COMPARISON:  None. FINDINGS: Brain: No evidence of acute infarction, hemorrhage, cerebral edema, mass, mass effect, or midline shift. Ventricles and sulci are within normal limits for age. No extra-axial fluid collection. Vascular: No hyperdense vessel or unexpected calcification. Skull: Normal. Negative for fracture or focal lesion. Sinuses/Orbits: No acute finding. Other: The mastoid air cells are well aerated. IMPRESSION: No acute intracranial process. Electronically Signed   By: Wiliam Ke M.D.   On: 04/07/2021 14:28   MR ANGIO HEAD WO CONTRAST  Result Date: 04/07/2021 CLINICAL DATA:  Neuro deficit, stroke suspected EXAM: MRI HEAD WITHOUT CONTRAST MRA HEAD WITHOUT CONTRAST TECHNIQUE: Multiplanar, multi-echo pulse sequences of the brain and surrounding structures were acquired without intravenous contrast. Angiographic images of the Circle of Willis were acquired using MRA technique without intravenous contrast. COMPARISON:  No prior MRI, correlation is made with same day head CT 04/07/2021 FINDINGS: MRI HEAD FINDINGS Brain: No acute infarction, hemorrhage, hydrocephalus, extra-axial collection, or mass lesion. The ventricles and sulci are within normal limits for age. Scattered T2 hyperintense signal in the left periventricular white matter, suggestive of a watershed distribution, sessile central likely the sequela of mild chronic  small vessel ischemic disease. Focus of susceptibility in the right cerebellar hemisphere, likely sequela of prior hypertensive microhemorrhage. Vascular: Normal flow voids. Skull and upper cervical spine: Normal marrow signal. Sinuses/Orbits: Negative. Other: Trace fluid in the mastoid air cells. MRA HEAD FINDINGS Anterior circulation: The left intracranial internal carotid artery is not visualized until the supraclinoid segment. The right intracranial internal carotid artery is patent to the ICA terminus. Diminutive left A1 and M1 segments, compared to the right side. Normal right MCA. Distal MCA branches are perfused bilaterally and grossly symmetric. Visualization of the anterior communicating artery is limited. Posterior circulation: Focal non opacification at the superior extent of the basilar artery (series 19, images 93-99 and series 106, image 146). The left superior cerebellar poor visualization of the right superior cerebellar artery artery origin is patent. Narrowing of the origins of the bilateral PCAs, with poor flow distally. Diminutive left posterior communicating artery is seen. The right posterior communicating artery is not visualized. Left dominant vertebral system. Poor visualization of the distal right V4, proximal to the vertebrobasilar junction. Patent left vertebral artery. The bilateral PICAs are visualized. Anatomic variants: None significant IMPRESSION: 1. Non opacification  of the left intracranial internal carotid artery from the skull base to the supraclinoid segment, of indeterminate acuity. 2. Focal non opacification of the distal basilar artery, concerning for acute thrombus. This results in poor opacification of the right superior cerebellar artery and stenosis of the origin of the bilateral PCAs, with poor opacification of the distal PCAs. 3. Diminutive left A1 and M1 segments, with normal distal ACA and MCA branches. 4. Poor opacification of the distal right vertebral artery. 5.  No acute infarct or hemorrhage. These results were called by telephone at the time of interpretation on 04/07/2021 at 5:07 pm to provider Dr. Lynelle Doctor, Who verbally acknowledged these results. Electronically Signed   By: Wiliam Ke M.D.   On: 04/07/2021 17:09   MR Brain Wo Contrast (neuro protocol)  Result Date: 04/07/2021 CLINICAL DATA:  Neuro deficit, stroke suspected EXAM: MRI HEAD WITHOUT CONTRAST MRA HEAD WITHOUT CONTRAST TECHNIQUE: Multiplanar, multi-echo pulse sequences of the brain and surrounding structures were acquired without intravenous contrast. Angiographic images of the Circle of Willis were acquired using MRA technique without intravenous contrast. COMPARISON:  No prior MRI, correlation is made with same day head CT 04/07/2021 FINDINGS: MRI HEAD FINDINGS Brain: No acute infarction, hemorrhage, hydrocephalus, extra-axial collection, or mass lesion. The ventricles and sulci are within normal limits for age. Scattered T2 hyperintense signal in the left periventricular white matter, suggestive of a watershed distribution, sessile central likely the sequela of mild chronic small vessel ischemic disease. Focus of susceptibility in the right cerebellar hemisphere, likely sequela of prior hypertensive microhemorrhage. Vascular: Normal flow voids. Skull and upper cervical spine: Normal marrow signal. Sinuses/Orbits: Negative. Other: Trace fluid in the mastoid air cells. MRA HEAD FINDINGS Anterior circulation: The left intracranial internal carotid artery is not visualized until the supraclinoid segment. The right intracranial internal carotid artery is patent to the ICA terminus. Diminutive left A1 and M1 segments, compared to the right side. Normal right MCA. Distal MCA branches are perfused bilaterally and grossly symmetric. Visualization of the anterior communicating artery is limited. Posterior circulation: Focal non opacification at the superior extent of the basilar artery (series 19, images 93-99  and series 106, image 146). The left superior cerebellar poor visualization of the right superior cerebellar artery artery origin is patent. Narrowing of the origins of the bilateral PCAs, with poor flow distally. Diminutive left posterior communicating artery is seen. The right posterior communicating artery is not visualized. Left dominant vertebral system. Poor visualization of the distal right V4, proximal to the vertebrobasilar junction. Patent left vertebral artery. The bilateral PICAs are visualized. Anatomic variants: None significant IMPRESSION: 1. Non opacification of the left intracranial internal carotid artery from the skull base to the supraclinoid segment, of indeterminate acuity. 2. Focal non opacification of the distal basilar artery, concerning for acute thrombus. This results in poor opacification of the right superior cerebellar artery and stenosis of the origin of the bilateral PCAs, with poor opacification of the distal PCAs. 3. Diminutive left A1 and M1 segments, with normal distal ACA and MCA branches. 4. Poor opacification of the distal right vertebral artery. 5. No acute infarct or hemorrhage. These results were called by telephone at the time of interpretation on 04/07/2021 at 5:07 pm to provider Dr. Lynelle Doctor, Who verbally acknowledged these results. Electronically Signed   By: Wiliam Ke M.D.   On: 04/07/2021 17:09   DG Chest Port 1 View  Result Date: 04/07/2021 CLINICAL DATA:  Chest pain EXAM: PORTABLE CHEST 1 VIEW COMPARISON:  None. FINDINGS: The  cardiomediastinal silhouette is within normal limits. There is no focal consolidation or pulmonary edema. There is no pleural effusion or pneumothorax. There is no acute osseous abnormality. IMPRESSION: No radiographic evidence of acute cardiopulmonary process. Electronically Signed   By: Lesia Hausen M.D.   On: 04/07/2021 13:55     Scheduled Meds:  Chlorhexidine Gluconate Cloth  6 each Topical Daily    morphine injection  4 mg  Intravenous Once   Continuous Infusions:  heparin 1,100 Units/hr (04/08/21 0000)     LOS: 1 day   Time spent:  Azucena Fallen, DO [facility]  If 7PM-7AM, please contact night-coverage [email or web address]  04/08/2021, 8:29 AM

## 2021-04-08 NOTE — Progress Notes (Signed)
SLP Cancellation Note  Patient Details Name: Geroge Gilliam MRN: [id number] DOB: October 02, 1955   Cancelled treatment:     Chart screened, pt NIH 0. No speech needs. Passed [facility] swallow screen.  SLP to sign off.  Davanna He L. Samson Frederic, MA CCC/SLP [facility] Office number [phone] Pager [phone]       Blenda Mounts Laurice 04/08/2021, 11:19 AM

## 2021-04-08 NOTE — Progress Notes (Addendum)
STROKE TEAM PROGRESS NOTE   INTERVAL HISTORY  No one is at the bedside at time of this exam. Patient is awake and coherent. Understands plan of care. Plan is to start eliquis today given history of atrial fibrillation. Discontinue heparin drip and antiplatelet therapy today.  He presented with transient symptoms of vision dysfunction and left hand paresthesias which resolved shortly after admission.  CT angiogram showed chronic left carotid occlusion with adequate flow in the left MCA through collaterals.  Basilar tip clot with subtotal occlusion with preserved distal flow.  He was started on IV heparin drip and has remained stable overnight without any recurrent symptoms.  He does have known history of atrial fibrillation but was not on anticoagulation due to low CHA2DS2-VASc score.  He does see cardiologist Dr. Eden Emms. Vitals:   04/08/21 0500 04/08/21 0600 04/08/21 0700 04/08/21 0800  BP: 108/73 102/78 111/80 107/71  Pulse: 75  (!) 55 (!) 52  Resp: 17  18 15   Temp:      TempSrc:      SpO2: 96%  97% 98%  Weight:      Height:       CBC:  Recent Labs  Lab 04/07/21 1333 04/08/21 0225  WBC 7.2 5.7  NEUTROABS 5.3  --   HGB 14.5 14.3  HCT 42.4 41.8  MCV 95.7 94.6  PLT 176 183   Basic Metabolic Panel:  Recent Labs  Lab 04/07/21 1333  NA 139  K 4.1  CL 106  CO2 26  GLUCOSE 92  BUN 19  CREATININE 0.89  CALCIUM 9.4  MG 2.1    Lipid Panel:  Recent Labs  Lab 04/08/21 0225  CHOL 222*  TRIG 24  HDL 76  CHOLHDL 2.9  VLDL 5  LDLCALC 141*    HgbA1c:  Recent Labs  Lab 04/08/21 0225  HGBA1C 5.1   Urine Drug Screen:  Recent Labs  Lab 04/07/21 1428  LABOPIA NONE DETECTED  COCAINSCRNUR NONE DETECTED  LABBENZ NONE DETECTED  AMPHETMU NONE DETECTED  THCU NONE DETECTED  LABBARB NONE DETECTED    Alcohol Level  Recent Labs  Lab 04/07/21 1333  ETH <10    IMAGING past 24 hours CT ANGIO HEAD NECK W WO CM  Result Date: 04/07/2021 CLINICAL DATA:  Fine motor control  problems with the right hand some numbness tingling to the left hand and blurred visual changes. Patient has a history of atrial fibrillation EXAM: CT ANGIOGRAPHY HEAD AND NECK TECHNIQUE: Multidetector CT imaging of the head and neck was performed using the standard protocol during bolus administration of intravenous contrast. Multiplanar CT image reconstructions and MIPs were obtained to evaluate the vascular anatomy. Carotid stenosis measurements (when applicable) are obtained utilizing NASCET criteria, using the distal internal carotid diameter as the denominator. CONTRAST:  46mL OMNIPAQUE IOHEXOL 350 MG/ML SOLN COMPARISON:  None. FINDINGS: CTA NECK FINDINGS SKELETON: There is no bony spinal canal stenosis. No lytic or blastic lesion. OTHER NECK: Normal pharynx, larynx and major salivary glands. No cervical lymphadenopathy. Unremarkable thyroid gland. UPPER CHEST: No pneumothorax or pleural effusion. No nodules or masses. AORTIC ARCH: There is no calcific atherosclerosis of the aortic arch. There is no aneurysm, dissection or hemodynamically significant stenosis of the visualized portion of the aorta. Conventional 3 vessel aortic branching pattern. The visualized proximal subclavian arteries are widely patent. RIGHT CAROTID SYSTEM: Normal without aneurysm, dissection or stenosis. LEFT CAROTID SYSTEM: The left ICA is occluded at its origin and remains occluded to the distal cavernous segment. VERTEBRAL  ARTERIES: Left dominant configuration. Both origins are clearly patent. There is no dissection, occlusion or flow-limiting stenosis to the skull base (V1-V3 segments). CTA HEAD FINDINGS POSTERIOR CIRCULATION: --Vertebral arteries: Mild atherosclerotic calcification on the left without stenosis. --Inferior cerebellar arteries: Normal. --Basilar artery: There is thrombosis of the most distal aspect of the basilar artery. Both PCA P1 segments remain opacified, suggesting that the thrombosis is nonocclusive. --Superior  cerebellar arteries: Normal. --Posterior cerebral arteries (PCA): Normal. ANTERIOR CIRCULATION: --Intracranial internal carotid arteries: Left is occluded proximally with reconstitution at the distal cavernous segment. The right is normal aside from mild atherosclerotic calcification. --Anterior cerebral arteries (ACA): Normal. Both A1 segments are present. Patent anterior communicating artery (a-comm). --Middle cerebral arteries (MCA): Normal. VENOUS SINUSES: As permitted by contrast timing, patent. ANATOMIC VARIANTS: None Review of the MIP images confirms the above findings. IMPRESSION: 1. Occlusion of the left internal carotid artery from its origin to the distal cavernous segment. 2. Occlusion of the most distal aspect of the basilar artery. Both PCA P1 segments remain opacified, suggesting that the thrombosis is nonocclusive. No visible posterior communicating artery. 3. No other intracranial arterial occlusion or high-grade stenosis. Electronically Signed   By: Deatra Robinson M.D.   On: 04/07/2021 20:33   CT HEAD WO CONTRAST  Result Date: 04/07/2021 CLINICAL DATA:  Neuro deficit, stroke suspected lightheaded EXAM: CT HEAD WITHOUT CONTRAST TECHNIQUE: Contiguous axial images were obtained from the base of the skull through the vertex without intravenous contrast. COMPARISON:  None. FINDINGS: Brain: No evidence of acute infarction, hemorrhage, cerebral edema, mass, mass effect, or midline shift. Ventricles and sulci are within normal limits for age. No extra-axial fluid collection. Vascular: No hyperdense vessel or unexpected calcification. Skull: Normal. Negative for fracture or focal lesion. Sinuses/Orbits: No acute finding. Other: The mastoid air cells are well aerated. IMPRESSION: No acute intracranial process. Electronically Signed   By: Wiliam Ke M.D.   On: 04/07/2021 14:28   MR ANGIO HEAD WO CONTRAST  Result Date: 04/07/2021 CLINICAL DATA:  Neuro deficit, stroke suspected EXAM: MRI HEAD  WITHOUT CONTRAST MRA HEAD WITHOUT CONTRAST TECHNIQUE: Multiplanar, multi-echo pulse sequences of the brain and surrounding structures were acquired without intravenous contrast. Angiographic images of the Circle of Willis were acquired using MRA technique without intravenous contrast. COMPARISON:  No prior MRI, correlation is made with same day head CT 04/07/2021 FINDINGS: MRI HEAD FINDINGS Brain: No acute infarction, hemorrhage, hydrocephalus, extra-axial collection, or mass lesion. The ventricles and sulci are within normal limits for age. Scattered T2 hyperintense signal in the left periventricular white matter, suggestive of a watershed distribution, sessile central likely the sequela of mild chronic small vessel ischemic disease. Focus of susceptibility in the right cerebellar hemisphere, likely sequela of prior hypertensive microhemorrhage. Vascular: Normal flow voids. Skull and upper cervical spine: Normal marrow signal. Sinuses/Orbits: Negative. Other: Trace fluid in the mastoid air cells. MRA HEAD FINDINGS Anterior circulation: The left intracranial internal carotid artery is not visualized until the supraclinoid segment. The right intracranial internal carotid artery is patent to the ICA terminus. Diminutive left A1 and M1 segments, compared to the right side. Normal right MCA. Distal MCA branches are perfused bilaterally and grossly symmetric. Visualization of the anterior communicating artery is limited. Posterior circulation: Focal non opacification at the superior extent of the basilar artery (series 19, images 93-99 and series 106, image 146). The left superior cerebellar poor visualization of the right superior cerebellar artery artery origin is patent. Narrowing of the origins of the bilateral PCAs, with  poor flow distally. Diminutive left posterior communicating artery is seen. The right posterior communicating artery is not visualized. Left dominant vertebral system. Poor visualization of the  distal right V4, proximal to the vertebrobasilar junction. Patent left vertebral artery. The bilateral PICAs are visualized. Anatomic variants: None significant IMPRESSION: 1. Non opacification of the left intracranial internal carotid artery from the skull base to the supraclinoid segment, of indeterminate acuity. 2. Focal non opacification of the distal basilar artery, concerning for acute thrombus. This results in poor opacification of the right superior cerebellar artery and stenosis of the origin of the bilateral PCAs, with poor opacification of the distal PCAs. 3. Diminutive left A1 and M1 segments, with normal distal ACA and MCA branches. 4. Poor opacification of the distal right vertebral artery. 5. No acute infarct or hemorrhage. These results were called by telephone at the time of interpretation on 04/07/2021 at 5:07 pm to provider Dr. Lynelle Doctor, Who verbally acknowledged these results. Electronically Signed   By: Wiliam Ke M.D.   On: 04/07/2021 17:09   MR Brain Wo Contrast (neuro protocol)  Result Date: 04/07/2021 CLINICAL DATA:  Neuro deficit, stroke suspected EXAM: MRI HEAD WITHOUT CONTRAST MRA HEAD WITHOUT CONTRAST TECHNIQUE: Multiplanar, multi-echo pulse sequences of the brain and surrounding structures were acquired without intravenous contrast. Angiographic images of the Circle of Willis were acquired using MRA technique without intravenous contrast. COMPARISON:  No prior MRI, correlation is made with same day head CT 04/07/2021 FINDINGS: MRI HEAD FINDINGS Brain: No acute infarction, hemorrhage, hydrocephalus, extra-axial collection, or mass lesion. The ventricles and sulci are within normal limits for age. Scattered T2 hyperintense signal in the left periventricular white matter, suggestive of a watershed distribution, sessile central likely the sequela of mild chronic small vessel ischemic disease. Focus of susceptibility in the right cerebellar hemisphere, likely sequela of prior  hypertensive microhemorrhage. Vascular: Normal flow voids. Skull and upper cervical spine: Normal marrow signal. Sinuses/Orbits: Negative. Other: Trace fluid in the mastoid air cells. MRA HEAD FINDINGS Anterior circulation: The left intracranial internal carotid artery is not visualized until the supraclinoid segment. The right intracranial internal carotid artery is patent to the ICA terminus. Diminutive left A1 and M1 segments, compared to the right side. Normal right MCA. Distal MCA branches are perfused bilaterally and grossly symmetric. Visualization of the anterior communicating artery is limited. Posterior circulation: Focal non opacification at the superior extent of the basilar artery (series 19, images 93-99 and series 106, image 146). The left superior cerebellar poor visualization of the right superior cerebellar artery artery origin is patent. Narrowing of the origins of the bilateral PCAs, with poor flow distally. Diminutive left posterior communicating artery is seen. The right posterior communicating artery is not visualized. Left dominant vertebral system. Poor visualization of the distal right V4, proximal to the vertebrobasilar junction. Patent left vertebral artery. The bilateral PICAs are visualized. Anatomic variants: None significant IMPRESSION: 1. Non opacification of the left intracranial internal carotid artery from the skull base to the supraclinoid segment, of indeterminate acuity. 2. Focal non opacification of the distal basilar artery, concerning for acute thrombus. This results in poor opacification of the right superior cerebellar artery and stenosis of the origin of the bilateral PCAs, with poor opacification of the distal PCAs. 3. Diminutive left A1 and M1 segments, with normal distal ACA and MCA branches. 4. Poor opacification of the distal right vertebral artery. 5. No acute infarct or hemorrhage. These results were called by telephone at the time of interpretation on 04/07/2021  at 5:07 pm to provider Dr. Lynelle Doctor, Who verbally acknowledged these results. Electronically Signed   By: Wiliam Ke M.D.   On: 04/07/2021 17:09   DG Chest Port 1 View  Result Date: 04/07/2021 CLINICAL DATA:  Chest pain EXAM: PORTABLE CHEST 1 VIEW COMPARISON:  None. FINDINGS: The cardiomediastinal silhouette is within normal limits. There is no focal consolidation or pulmonary edema. There is no pleural effusion or pneumothorax. There is no acute osseous abnormality. IMPRESSION: No radiographic evidence of acute cardiopulmonary process. Electronically Signed   By: Lesia Hausen M.D.   On: 04/07/2021 13:55    PHYSICAL EXAM Pleasant well-built middle-aged Caucasian male not in distress. . Afebrile. Head is nontraumatic. Neck is supple without bruit.    Cardiac exam no murmur or gallop. Lungs are clear to auscultation. Distal pulses are well felt.   Mental status/Cognition: Alert, oriented to self, place, month and year, good attention.  Speech/language: Fluent, comprehension intact, object naming intact, repetition intact.  Cranial nerves:   CN II Pupils equal and reactive to light, no VF deficits    CN III,IV,VI EOM intact, no gaze preference or deviation, no nystagmus    CN V normal sensation in V1, V2, and V3 segments bilaterally    CN VII no asymmetry, no nasolabial fold flattening    CN VIII normal hearing to speech    CN IX & X normal palatal elevation, no uvular deviation    CN XI 5/5 head turn and 5/5 shoulder shrug bilaterally    CN XII midline tongue protrusion    Coordination/Complex Motor:  - Finger to Nose intact BL - Heel to shin intact BL - Rapid alternating movement normal  ASSESSMENT/PLAN Mr. Levi Davis is a 65 y.o. male with history of  longstanding pAfibb not on AC for low CHADs2-Vasc score, HLD who presented with BL arm weakness and numbness and in persistent Afibb. He finished a 3 mile hike in the morning with his dog and when he got back to his truck, had an episode of  cross eyes with R hand clumsiness with trouble with fine motor skills and left hand numbness. This lasted 10-12 mins and resolved.   He spoke to his wife and she urged him to come to ED.    MRI Brain w/o contrast was negative for an acute stroke. MRA head demonstrated concerns for acute distal basilar thrombosis and CTA demonstrated basilar thrombus at the tip of the basilar artery. BL PCA P1 segments are patent.   mRS: 0 tNK/Thrombectomy: not offered at this time due to resolution of symptoms. He was started on heparin drip overnight.   Stroke, chronic :  left infarct embolic secondary to  large vessel disease  (chronically occluded Lt ICA and acute thrombus at basilar tip).  Code Stroke  CT head No acute abnormality.  Small vessel disease. Atrophy.  ASPECTS 10.     CTA head:   1. Occlusion of the left internal carotid artery from its origin to the distal cavernous segment. 2. Occlusion of the most distal aspect of the basilar artery. Both PCA P1 segments remain opacified, suggesting that the thrombosis is nonocclusive. No visible posterior communicating artery. 3. No other intracranial arterial occlusion or high-grade stenosis.    CTA NECK:   RIGHT CAROTID SYSTEM: Normal without aneurysm, dissection or stenosis.   LEFT CAROTID SYSTEM: The left ICA is occluded at its origin and remains occluded to the distal cavernous segment.   VERTEBRAL ARTERIES: Left dominant configuration. Both origins are clearly  patent. There is no dissection, occlusion or flow-limiting stenosis to the skull base (V1-V3 segments). CTA Brain : thrombosis of the most distal aspect of the basilar artery. Both PCA P1 segments remain opacified, suggesting that the thrombosis is nonocclusive. MRI BRAIN:   1. Non opacification of the left intracranial internal carotid artery from the skull base to the supraclinoid segment, of indeterminate acuity. 2. Focal non opacification of the distal basilar artery,  concerning for acute thrombus. This results in poor opacification of the right superior cerebellar artery and stenosis of the origin of the bilateral PCAs, with poor opacification of the distal PCAs. 3. Diminutive left A1 and M1 segments, with normal distal ACA and MCA branches. 4. Poor opacification of the distal right vertebral artery. 5. No acute infarct or hemorrhage.  MRA  HEAD: 1. Non opacification of the left intracranial internal carotid artery from the skull base to the supraclinoid segment, of indeterminate acuity. 2. Focal non opacification of the distal basilar artery, concerning for acute thrombus. This results in poor opacification of the right superior cerebellar artery and stenosis of the origin of the bilateral PCAs, with poor opacification of the distal PCAs. 3. Diminutive left A1 and M1 segments, with normal distal ACA and MCA branches. 4. Poor opacification of the distal right vertebral artery. 5. No acute infarct or hemorrhage.   2D Echo abnormal septal motion.  Ejection fraction 55 to 60%.  Left atrial size is normal.  No clot noted.  LDL 141 HgbA1c 5.1 VTE prophylaxis -IV heparin    Diet   Diet Heart Room service appropriate? Yes; Fluid consistency: Thin   No antithrombotic prior to admission, now on Eliquis (apixaban) daily.   Therapy recommendations:  home no PT Disposition:  home   Hypertension Home meds:  none Stable Permissive hypertension (OK if < 220/120) but gradually normalize in 5-7 days Long-term BP goal normotensive  Hyperlipidemia Home meds:  none,  LDL 141, goal < 70 Add lipitor 80mg  daily  High intensity statin started in hospital Continue statin at discharge  HgbA1c 5.1, goal < 7.0 CBGs No results for input(s): GLUCAP in the last 72 hours.  SSI  Other Stroke Risk Factors Atrial fibrillation   Hospital day # 1   I have personally obtained history,examined this patient, reviewed notes, independently viewed imaging studies,  participated in medical decision making and plan of care.ROS completed by me personally and pertinent positives fully documented  I have made any additions or clarifications directly to the above note. Agree with note above.  Patient presented with a posterior circulation TIA secondary to nonocclusive basilar tip thrombus likely from atrial fibrillation not on anticoagulation.  Patient remains at recurrent risk for thromboembolism from his A. fib and benefits of long-term anticoagulation.  Recommend change IV heparin to Eliquis.  Recommend statin for elevated lipids.  Long discussion with patient regarding need for secondary stroke prevention and risk-benefit of anticoagulation and taking statins and answered questions.  Mobilize out of bed.  Therapy consults.This patient is critically ill and at significant risk of neurological worsening, death and care requires constant monitoring of vital signs, hemodynamics,respiratory and cardiac monitoring, extensive review of multiple databases, frequent neurological assessment, discussion with family, other specialists and medical decision making of high complexity.I have made any additions or clarifications directly to the above note.This critical care time does not reflect procedure time, or teaching time or supervisory time of PA/NP/Med Resident etc but could involve care discussion time.  I spent 30 minutes of neurocritical care  time  in the care of  this patient.      Delia Heady, MD Medical Director [facility] Pager: [phone] 04/08/2021 4:40 PM   To contact Stroke Continuity provider, please refer to [email or web address]. After hours, contact General Neurology

## 2021-04-08 NOTE — Consult Note (Addendum)
NAME:  Levi Davis, MRN:  [id number], DOB:  19-Apr-1956, LOS: 1 ADMISSION DATE:  04/07/2021, CONSULTATION DATE:  04/08/2021 REFERRING MD: Erick Blinks, MD, CHIEF COMPLAINT:  COVID-19 positive  History of Present Illness:  Levi Davis is a 65 yo male with PMH of a fib not on anticoagulation for low CHADs2-Vasc and HLD who presented on 10/11 with bilateral arm weakness and numbness, found to be in persistent a fib. States yesterday morning he woke up feeling in usual state of health and went on a hike with his dog. When he got back to his car, he started to feel left hand numbness and clumsiness in his R hand. He also felt his eyes cross. This episode only lasted for a few minutes and resolved. His wife stated he should come to the ED.   MRI brain w/o contrast negative for acute stroke. 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 Started on heparin and transferred to [facility]. Has been stable since arrival and throughout admission.   Tested positive on admission via PCR test for COVID-19. Patient is not vaccinated. States he is asymptomatic. Denies fevers, chills, sore throat, cough, runny nose, shortness of breath, chest pain, or abdominal pain. He overall feels very well. States he has not felt any of these symptoms in the past few days or weeks. Notes he did test positive for COVID-19 on home test back in April. Was not hospitalized and did not receive Paxlovid, remdesivir, dex, etc. States he felt "bad" with usual URI/cold symptoms that resolved in about 4-5 days. States he did taken Ivermectin for about 3 days during this time.   Pertinent  Medical History   Past Medical History:  Diagnosis Date   A-fib (HCC) 06/23/2018   Arrhythmia 2013   AFIB   Closed right ankle fracture    Hypercholesterolemia    Significant Hospital Events: Including procedures, antibiotic start and stop dates in addition to other pertinent events   10/11 admitted for basilar  artery thrombosis 10/11 positive COVID-19 PCR, asymptomatic  Interim History / Subjective:  HPI as above. Patient states he is feeling well today overall.   Objective   Blood pressure 118/80, pulse 65, temperature 98.6 F (37 C), temperature source Oral, resp. rate 20, height 6' (1.829 m), weight 77.6 kg, SpO2 99 %.        Intake/Output Summary (Last 24 hours) at 04/08/2021 1256 Last data filed at 04/08/2021 1200 Gross per 24 hour  Intake 196.4 ml  Output 870 ml  Net -673.6 ml   Filed Weights   04/07/21 1152  Weight: 77.6 kg    Examination: General: well appearing male, sitting up in bed HENT: sclera anicteric Lungs: on room air, normal work of breathing Cardiovascular: irregular rhythm, warm Abdomen: soft, non tender, normal active bowel sounds Extremities: no edema appreciated Neuro: alert and oriented, speaking appropriately and coherently  Resolved Hospital Problem list     Assessment & Plan:  Positive COVID-19 test, asymptomatic  History of COVID-19 infection in April 2022 Given patient is asymptomatic and with out hypoxemia with clear chest imaging, positive COVID-19 test is most likely an incidental finding. Denies current symptoms or symptoms preceding hospitalization, thus does not necessitate any medical therapy for COVID-19. -Continue to monitor for development of symptoms  PCCM will sign off  Best Practice (right click and "Reselect all SmartList Selections" daily)   Per primary  Labs   CBC: Recent Labs  Lab 04/07/21 1333 04/08/21 0225  WBC 7.2  5.7  NEUTROABS 5.3  --   HGB 14.5 14.3  HCT 42.4 41.8  MCV 95.7 94.6  PLT 176 183    Basic Metabolic Panel: Recent Labs  Lab 04/07/21 1333  NA 139  K 4.1  CL 106  CO2 26  GLUCOSE 92  BUN 19  CREATININE 0.89  CALCIUM 9.4  MG 2.1   GFR: Estimated Creatinine Clearance: 92 mL/min (by C-G formula based on SCr of 0.89 mg/dL). Recent Labs  Lab 04/07/21 1333 04/08/21 0225  WBC 7.2 5.7     Liver Function Tests: Recent Labs  Lab 04/07/21 1333  AST 21  ALT 17  ALKPHOS 50  BILITOT 1.0  PROT 7.5  ALBUMIN 4.6   No results for input(s): LIPASE, AMYLASE in the last 168 hours. No results for input(s): AMMONIA in the last 168 hours.  ABG No results found for: PHART, PCO2ART, PO2ART, HCO3, TCO2, ACIDBASEDEF, O2SAT   Coagulation Profile: Recent Labs  Lab 04/07/21 1451  INR 1.0    Cardiac Enzymes: No results for input(s): CKTOTAL, CKMB, CKMBINDEX, TROPONINI in the last 168 hours.  HbA1C: Hgb A1c MFr Bld  Date/Time Value Ref Range Status  04/08/2021 02:25 AM 5.1 4.8 - 5.6 % Final    Comment:    (NOTE) Pre diabetes:          5.7%-6.4%  Diabetes:              >6.4%  Glycemic control for   <7.0% adults with diabetes     CBG: No results for input(s): GLUCAP in the last 168 hours.  Review of Systems:   ROS per HPI.  Past Medical History:  He,  has a past medical history of A-fib (HCC) (06/23/2018), Arrhythmia (2013), Closed right ankle fracture, and Hypercholesterolemia.   Surgical History:   Past Surgical History:  Procedure Laterality Date   broke leg     screws and pins    Social History:   reports that he has never smoked. He has never used smokeless tobacco. He reports current alcohol use. He reports that he does not use drugs.   Family History:  His family history includes Aneurysm in his sister; Anuerysm in his mother; Heart Problems in his father; High blood pressure in his father, mother, and sister; Obesity in his sister; Stroke in his mother.   Allergies No Known Allergies   Home Medications  Prior to Admission medications   Medication Sig Start Date End Date Taking? Authorizing Provider  atenolol (TENORMIN) 50 MG tablet Take 0.5 tablets (25 mg total) by mouth daily. 04/01/20  Yes Wendall Stade, MD  Cholecalciferol (VITAMIN D3) 125 MCG (5000 UT) CAPS Take 5,000 Units by mouth daily.   Yes [provider]  co-enzyme Q-10  30 MG capsule Take 30 mg by mouth daily.   Yes [provider]  Cyanocobalamin (VITAMIN B-12 PO) Take 2,500 mcg by mouth daily.   Yes [provider]  dronedarone (MULTAQ) 400 MG tablet TAKE ONE-HALF (1/2) TABLET TWICE A DAY WITH MEALS Patient taking differently: Take 200 mg by mouth 2 (two) times daily with a meal. 03/23/21  Yes Wendall Stade, MD  Krill Oil 1000 MG CAPS Take 2 capsules by mouth 2 (two) times daily.   Yes [provider]  medium chain triglycerides (MCT OIL) oil Take 15 mLs by mouth daily.   Yes [provider]  Omega-3 Fatty Acids (FISH OIL) 1000 MG CAPS Take 2,000 mg by mouth daily.   Yes  [provider]  Probiotic Product (PROBIOTIC-10) CAPS Take 2-3 capsules by mouth daily.   Yes [provider]   Critical care time: n/a    Karren Burly, MD See Loretha Stapler for contact info

## 2021-04-08 NOTE — Progress Notes (Signed)
  Echocardiogram 2D Echocardiogram has been performed.  Janalyn Harder 04/08/2021, 12:08 PM

## 2021-04-08 NOTE — Progress Notes (Signed)
PT Cancellation Note  Patient Details Name: Levi Davis MRN: [id number] DOB: [birth year]   Cancelled Treatment:    Reason Eval/Treat Not Completed: PT screened, no needs identified, will sign off. NIH 0 at this time. Patient at baseline functioning. Observed independently ambulating in room. PT will sign off due to no needs identified.   Elham Fini A. Dan Humphreys PT, DPT Acute Rehabilitation Services Pager [phone] Office [phone]    Viviann Spare 04/08/2021, 8:58 AM

## 2021-04-08 NOTE — Progress Notes (Signed)
ANTICOAGULATION CONSULT NOTE - Initial Consult  Pharmacy Consult for Eliquis Indication: basilar thrombus  No Known Allergies  Patient Measurements: Height: 6' (182.9 cm) Weight: 77.6 kg (171 lb) IBW/kg (Calculated) : 77.6  Vital Signs: Temp: 98.6 F (37 C) (10/12 0400) Temp Source: Oral (10/12 0400) BP: 107/71 (10/12 0800) Pulse Rate: 52 (10/12 0800)  Labs: Recent Labs    04/07/21 1333 04/07/21 1451 04/08/21 0225  HGB 14.5  --  14.3  HCT 42.4  --  41.8  PLT 176  --  183  APTT  --  27  --   LABPROT  --  13.1  --   INR  --  1.0  --   HEPARINUNFRC  --   --  0.38  CREATININE 0.89  --   --     Estimated Creatinine Clearance: 92 mL/min (by C-G formula based on SCr of 0.89 mg/dL).   Medical History: Past Medical History:  Diagnosis Date   A-fib (HCC) 06/23/2018   Arrhythmia 2013   AFIB   Closed right ankle fracture    Hypercholesterolemia     Assessment: Anticoag: Heparin for Afib, Ddimer 0.54 slightly elevated. CBC WNL, Scr <1. Transition to Eliquis 10/12. No loading desired per neuro  Goal of Therapy:  Therapeutic oral anticoagulation  Plan:  D/c heparin Eliquis 5mg  BID (no loading required per neurology) F/u TTE   Raliegh Scobie S. , PharmD, BCPS Clinical Staff Pharmacist [email or web address] Merilynn Finland, Merilynn Finland 04/08/2021,9:44 AM

## 2021-04-08 NOTE — Progress Notes (Signed)
ANTICOAGULATION CONSULT NOTE   Pharmacy Consult for Heparin Indication: atrial fibrillation  No Known Allergies  Patient Measurements: Height: 6' (182.9 cm) Weight: 77.6 kg (171 lb) IBW/kg (Calculated) : 77.6 Heparin Dosing Weight: total weight  Vital Signs: Temp: 98.2 F (36.8 C) (10/12 0000) Temp Source: Oral (10/12 0000) BP: 107/60 (10/12 0400) Pulse Rate: 69 (10/12 0400)  Labs: Recent Labs    04/07/21 1333 04/07/21 1451 04/08/21 0225  HGB 14.5  --  14.3  HCT 42.4  --  41.8  PLT 176  --  183  APTT  --  27  --   LABPROT  --  13.1  --   INR  --  1.0  --   HEPARINUNFRC  --   --  0.38  CREATININE 0.89  --   --      Estimated Creatinine Clearance: 92 mL/min (by C-G formula based on SCr of 0.89 mg/dL).   Medical History: Past Medical History:  Diagnosis Date   A-fib (HCC) 06/23/2018   Arrhythmia 2013   AFIB   Closed right ankle fracture    Hypercholesterolemia     Medications:  Scheduled:   Chlorhexidine Gluconate Cloth  6 each Topical Daily    morphine injection  4 mg Intravenous Once   Infusions:   heparin 1,100 Units/hr (04/08/21 0000)   PRN: acetaminophen **OR** acetaminophen (TYLENOL) oral liquid 160 mg/5 mL **OR** acetaminophen, metoprolol tartrate, senna-docusate  Assessment: 65 yo male with hx afib who presents with TIA, no acute infarct - plan aspirin/plavix.  Patient with Afib/RVR and basilary artery thrombosis. Pharmacy consulted to dose IV heparin, ok to bolus per discussion with Neurologist Dr. Derry Lory. CBC, renal function and baseline aPTT, PT/INR all WNL.  No prior anticoagulants PTA.  10/12 AM update:  Heparin level therapeutic  Goal of Therapy:  Heparin level 0.3-0.7 units/ml Monitor platelets by anticoagulation protocol: Yes   Plan:  Cont heparin at 1100 units/hr 1200 heparin level  Abran Duke, PharmD, BCPS Clinical Pharmacist Phone: [phone]

## 2021-04-09 MED ORDER — ATORVASTATIN CALCIUM 80 MG PO TABS
80.0000 mg | ORAL_TABLET | Freq: Every day | ORAL | 0 refills | Status: DC
Start: 2021-04-09 — End: 2021-04-29

## 2021-04-09 MED ORDER — APIXABAN 5 MG PO TABS: 5.0000 mg | ORAL_TABLET | Freq: Two times a day (BID) | ORAL | 0 refills | Status: DC

## 2021-04-09 NOTE — Discharge Summary (Addendum)
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   atorvastatin 80 MG tablet Commonly known as: LIPITOR Take 1 tablet (80 mg total) by mouth daily.   co-enzyme Q-10 30 MG capsule Take 30 mg by mouth daily.   Fish Oil 1000 MG Caps Take 2,000 mg by mouth daily.   Krill Oil 1000 MG Caps Take 2 capsules by mouth 2 (two) times daily.   medium chain triglycerides oil Commonly known as: MCT OIL Take 15 mLs by mouth daily.   Multaq 400 MG tablet Generic drug: dronedarone TAKE ONE-HALF (1/2) TABLET TWICE A DAY WITH MEALS What changed:  how much to take how to take this when to take this additional instructions   Probiotic-10 Caps Take 2-3 capsules by mouth daily.   VITAMIN B-12 PO Take 2,500 mcg by mouth daily.   Vitamin D3 125 MCG (5000 UT) Caps Take 5,000 Units by mouth daily.        LABORATORY STUDIES CBC    Component Value Date/Time   WBC 5.7 04/08/2021 0225   RBC 4.42 04/08/2021 0225   HGB 14.3 04/08/2021 0225   HCT 41.8 04/08/2021 0225   PLT 183 04/08/2021 0225   MCV 94.6 04/08/2021 0225   MCH 32.4 04/08/2021 0225   MCHC 34.2  04/08/2021 0225   RDW 11.9 04/08/2021 0225   LYMPHSABS 1.3 04/07/2021 1333   MONOABS 0.5 04/07/2021 1333   EOSABS 0.1 04/07/2021 1333   BASOSABS 0.0 04/07/2021 1333   CMP    Component Value Date/Time   NA 139 04/07/2021 1333   K 4.1 04/07/2021 1333   CL 106 04/07/2021 1333   CO2 26 04/07/2021 1333   GLUCOSE 92 04/07/2021 1333   BUN 19 04/07/2021 1333   CREATININE 0.89 04/07/2021 1333   CALCIUM 9.4 04/07/2021 1333   PROT 7.5 04/07/2021 1333   ALBUMIN 4.6 04/07/2021 1333   AST 21 04/07/2021 1333   ALT 17 04/07/2021 1333   ALKPHOS 50 04/07/2021 1333   BILITOT 1.0 04/07/2021 1333   GFRNONAA >60 04/07/2021 1333   COAGS Lab Results  Component Value Date   INR 1.0 04/07/2021   Lipid Panel    Component Value Date/Time   CHOL 222 (H) 04/08/2021 0225   TRIG 24 04/08/2021 0225   HDL 76 04/08/2021 0225   CHOLHDL 2.9 04/08/2021 0225   VLDL 5 04/08/2021 0225   LDLCALC 141 (H) 04/08/2021 0225   HgbA1C  Lab Results  Component Value Date   HGBA1C 5.1 04/08/2021   Urinalysis    Component Value Date/Time   COLORURINE COLORLESS (A) 04/07/2021 0215  APPEARANCEUR CLEAR 04/07/2021 0215   LABSPEC 1.003 (L) 04/07/2021 0215   PHURINE 5.0 04/07/2021 0215   GLUCOSEU NEGATIVE 04/07/2021 0215   HGBUR NEGATIVE 04/07/2021 0215   BILIRUBINUR NEGATIVE 04/07/2021 0215   KETONESUR NEGATIVE 04/07/2021 0215   PROTEINUR NEGATIVE 04/07/2021 0215   NITRITE NEGATIVE 04/07/2021 0215   LEUKOCYTESUR NEGATIVE 04/07/2021 0215   Urine Drug Screen     Component Value Date/Time   LABOPIA NONE DETECTED 04/07/2021 1428   COCAINSCRNUR NONE DETECTED 04/07/2021 1428   LABBENZ NONE DETECTED 04/07/2021 1428   AMPHETMU NONE DETECTED 04/07/2021 1428   THCU NONE DETECTED 04/07/2021 1428   LABBARB NONE DETECTED 04/07/2021 1428    Alcohol Level    Component Value Date/Time   ETH <10 04/07/2021 1333     SIGNIFICANT DIAGNOSTIC STUDIES CT ANGIO HEAD NECK W WO CM  Result Date: 04/07/2021 CLINICAL  DATA:  Fine motor control problems with the right hand some numbness tingling to the left hand and blurred visual changes. Patient has a history of atrial fibrillation EXAM: CT ANGIOGRAPHY HEAD AND NECK TECHNIQUE: Multidetector CT imaging of the head and neck was performed using the standard protocol during bolus administration of intravenous contrast. Multiplanar CT image reconstructions and MIPs were obtained to evaluate the vascular anatomy. Carotid stenosis measurements (when applicable) are obtained utilizing NASCET criteria, using the distal internal carotid diameter as the denominator. CONTRAST:  58mL OMNIPAQUE IOHEXOL 350 MG/ML SOLN COMPARISON:  None. FINDINGS: CTA NECK FINDINGS SKELETON: There is no bony spinal canal stenosis. No lytic or blastic lesion. OTHER NECK: Normal pharynx, larynx and major salivary glands. No cervical lymphadenopathy. Unremarkable thyroid gland. UPPER CHEST: No pneumothorax or pleural effusion. No nodules or masses. AORTIC ARCH: There is no calcific atherosclerosis of the aortic arch. There is no aneurysm, dissection or hemodynamically significant stenosis of the visualized portion of the aorta. Conventional 3 vessel aortic branching pattern. The visualized proximal subclavian arteries are widely patent. RIGHT CAROTID SYSTEM: Normal without aneurysm, dissection or stenosis. LEFT CAROTID SYSTEM: The left ICA is occluded at its origin and remains occluded to the distal cavernous segment. VERTEBRAL ARTERIES: Left dominant configuration. Both origins are clearly patent. There is no dissection, occlusion or flow-limiting stenosis to the skull base (V1-V3 segments). CTA HEAD FINDINGS POSTERIOR CIRCULATION: --Vertebral arteries: Mild atherosclerotic calcification on the left without stenosis. --Inferior cerebellar arteries: Normal. --Basilar artery: There is thrombosis of the most distal aspect of the basilar artery. Both PCA P1 segments remain opacified, suggesting that the thrombosis is  nonocclusive. --Superior cerebellar arteries: Normal. --Posterior cerebral arteries (PCA): Normal. ANTERIOR CIRCULATION: --Intracranial internal carotid arteries: Left is occluded proximally with reconstitution at the distal cavernous segment. The right is normal aside from mild atherosclerotic calcification. --Anterior cerebral arteries (ACA): Normal. Both A1 segments are present. Patent anterior communicating artery (a-comm). --Middle cerebral arteries (MCA): Normal. VENOUS SINUSES: As permitted by contrast timing, patent. ANATOMIC VARIANTS: None Review of the MIP images confirms the above findings. IMPRESSION: 1. Occlusion of the left internal carotid artery from its origin to the distal cavernous segment. 2. Occlusion of the most distal aspect of the basilar artery. Both PCA P1 segments remain opacified, suggesting that the thrombosis is nonocclusive. No visible posterior communicating artery. 3. No other intracranial arterial occlusion or high-grade stenosis. Electronically Signed   By: Deatra Robinson M.D.   On: 04/07/2021 20:33   CT HEAD WO CONTRAST  Result Date: 04/07/2021 CLINICAL DATA:  Neuro deficit, stroke suspected lightheaded EXAM: CT HEAD WITHOUT CONTRAST TECHNIQUE: Contiguous axial images  were obtained from the base of the skull through the vertex without intravenous contrast. COMPARISON:  None. FINDINGS: Brain: No evidence of acute infarction, hemorrhage, cerebral edema, mass, mass effect, or midline shift. Ventricles and sulci are within normal limits for age. No extra-axial fluid collection. Vascular: No hyperdense vessel or unexpected calcification. Skull: Normal. Negative for fracture or focal lesion. Sinuses/Orbits: No acute finding. Other: The mastoid air cells are well aerated. IMPRESSION: No acute intracranial process. Electronically Signed   By: Wiliam Ke M.D.   On: 04/07/2021 14:28   MR ANGIO HEAD WO CONTRAST  Result Date: 04/07/2021 CLINICAL DATA:  Neuro deficit, stroke  suspected EXAM: MRI HEAD WITHOUT CONTRAST MRA HEAD WITHOUT CONTRAST TECHNIQUE: Multiplanar, multi-echo pulse sequences of the brain and surrounding structures were acquired without intravenous contrast. Angiographic images of the Circle of Willis were acquired using MRA technique without intravenous contrast. COMPARISON:  No prior MRI, correlation is made with same day head CT 04/07/2021 FINDINGS: MRI HEAD FINDINGS Brain: No acute infarction, hemorrhage, hydrocephalus, extra-axial collection, or mass lesion. The ventricles and sulci are within normal limits for age. Scattered T2 hyperintense signal in the left periventricular white matter, suggestive of a watershed distribution, sessile central likely the sequela of mild chronic small vessel ischemic disease. Focus of susceptibility in the right cerebellar hemisphere, likely sequela of prior hypertensive microhemorrhage. Vascular: Normal flow voids. Skull and upper cervical spine: Normal marrow signal. Sinuses/Orbits: Negative. Other: Trace fluid in the mastoid air cells. MRA HEAD FINDINGS Anterior circulation: The left intracranial internal carotid artery is not visualized until the supraclinoid segment. The right intracranial internal carotid artery is patent to the ICA terminus. Diminutive left A1 and M1 segments, compared to the right side. Normal right MCA. Distal MCA branches are perfused bilaterally and grossly symmetric. Visualization of the anterior communicating artery is limited. Posterior circulation: Focal non opacification at the superior extent of the basilar artery (series 19, images 93-99 and series 106, image 146). The left superior cerebellar poor visualization of the right superior cerebellar artery artery origin is patent. Narrowing of the origins of the bilateral PCAs, with poor flow distally. Diminutive left posterior communicating artery is seen. The right posterior communicating artery is not visualized. Left dominant vertebral system. Poor  visualization of the distal right V4, proximal to the vertebrobasilar junction. Patent left vertebral artery. The bilateral PICAs are visualized. Anatomic variants: None significant IMPRESSION: 1. Non opacification of the left intracranial internal carotid artery from the skull base to the supraclinoid segment, of indeterminate acuity. 2. Focal non opacification of the distal basilar artery, concerning for acute thrombus. This results in poor opacification of the right superior cerebellar artery and stenosis of the origin of the bilateral PCAs, with poor opacification of the distal PCAs. 3. Diminutive left A1 and M1 segments, with normal distal ACA and MCA branches. 4. Poor opacification of the distal right vertebral artery. 5. No acute infarct or hemorrhage. These results were called by telephone at the time of interpretation on 04/07/2021 at 5:07 pm to provider Dr. Lynelle Doctor, Who verbally acknowledged these results. Electronically Signed   By: Wiliam Ke M.D.   On: 04/07/2021 17:09   MR Brain Wo Contrast (neuro protocol)  Result Date: 04/07/2021 CLINICAL DATA:  Neuro deficit, stroke suspected EXAM: MRI HEAD WITHOUT CONTRAST MRA HEAD WITHOUT CONTRAST TECHNIQUE: Multiplanar, multi-echo pulse sequences of the brain and surrounding structures were acquired without intravenous contrast. Angiographic images of the Circle of Willis were acquired using MRA technique without intravenous contrast. COMPARISON:  No prior MRI, correlation is made with same day head CT 04/07/2021 FINDINGS: MRI HEAD FINDINGS Brain: No acute infarction, hemorrhage, hydrocephalus, extra-axial collection, or mass lesion. The ventricles and sulci are within normal limits for age. Scattered T2 hyperintense signal in the left periventricular white matter, suggestive of a watershed distribution, sessile central likely the sequela of mild chronic small vessel ischemic disease. Focus of susceptibility in the right cerebellar hemisphere, likely  sequela of prior hypertensive microhemorrhage. Vascular: Normal flow voids. Skull and upper cervical spine: Normal marrow signal. Sinuses/Orbits: Negative. Other: Trace fluid in the mastoid air cells. MRA HEAD FINDINGS Anterior circulation: The left intracranial internal carotid artery is not visualized until the supraclinoid segment. The right intracranial internal carotid artery is patent to the ICA terminus. Diminutive left A1 and M1 segments, compared to the right side. Normal right MCA. Distal MCA branches are perfused bilaterally and grossly symmetric. Visualization of the anterior communicating artery is limited. Posterior circulation: Focal non opacification at the superior extent of the basilar artery (series 19, images 93-99 and series 106, image 146). The left superior cerebellar poor visualization of the right superior cerebellar artery artery origin is patent. Narrowing of the origins of the bilateral PCAs, with poor flow distally. Diminutive left posterior communicating artery is seen. The right posterior communicating artery is not visualized. Left dominant vertebral system. Poor visualization of the distal right V4, proximal to the vertebrobasilar junction. Patent left vertebral artery. The bilateral PICAs are visualized. Anatomic variants: None significant IMPRESSION: 1. Non opacification of the left intracranial internal carotid artery from the skull base to the supraclinoid segment, of indeterminate acuity. 2. Focal non opacification of the distal basilar artery, concerning for acute thrombus. This results in poor opacification of the right superior cerebellar artery and stenosis of the origin of the bilateral PCAs, with poor opacification of the distal PCAs. 3. Diminutive left A1 and M1 segments, with normal distal ACA and MCA branches. 4. Poor opacification of the distal right vertebral artery. 5. No acute infarct or hemorrhage. These results were called by telephone at the time of  interpretation on 04/07/2021 at 5:07 pm to provider Dr. Lynelle Doctor, Who verbally acknowledged these results. Electronically Signed   By: Wiliam Ke M.D.   On: 04/07/2021 17:09   DG Chest Port 1 View  Result Date: 04/07/2021 CLINICAL DATA:  Chest pain EXAM: PORTABLE CHEST 1 VIEW COMPARISON:  None. FINDINGS: The cardiomediastinal silhouette is within normal limits. There is no focal consolidation or pulmonary edema. There is no pleural effusion or pneumothorax. There is no acute osseous abnormality. IMPRESSION: No radiographic evidence of acute cardiopulmonary process. Electronically Signed   By: Lesia Hausen M.D.   On: 04/07/2021 13:55   ECHOCARDIOGRAM COMPLETE  Result Date: 04/08/2021    ECHOCARDIOGRAM REPORT   Patient Name:   Levi Davis Date of Exam: 04/08/2021 Medical Rec #:  [id number]    Height:       72.0 in Accession #:    [id number]   Weight:       171.0 lb Date of Birth:  [birth year]    BSA:          1.993 m Patient Age:    65 years     BP:           111/80 mmHg Patient Gender: M            HR:           68 bpm. Exam Location:  Inpatient Procedure: 2D Echo, 3D  Echo, Cardiac Doppler and Color Doppler Indications:    Stroke  History:        Patient has prior history of Echocardiogram examinations, most                 recent 11/27/2018. Abnormal ECG, Arrythmias:Atrial Fibrillation;                 Risk Factors:Dyslipidemia.  Sonographer:    Sheralyn Boatman RDCS Referring Phys: [id number] [facility] IMPRESSIONS  1. No bubble study performed.  2. Abnormal septal motion. Left ventricular ejection fraction, by estimation, is 55 to 60%. The left ventricle has normal function. The left ventricle has no regional wall motion abnormalities. Left ventricular diastolic parameters were normal.  3. Right ventricular systolic function is normal. The right ventricular size is normal.  4. The mitral valve is normal in structure. No evidence of mitral valve regurgitation. No evidence of mitral stenosis.  5. The aortic  valve is normal in structure. Aortic valve regurgitation is not visualized. No aortic stenosis is present.  6. The inferior vena cava is normal in size with greater than 50% respiratory variability, suggesting right atrial pressure of 3 mmHg. FINDINGS  Left Ventricle: Abnormal septal motion. Left ventricular ejection fraction, by estimation, is 55 to 60%. The left ventricle has normal function. The left ventricle has no regional wall motion abnormalities. The left ventricular internal cavity size was normal in size. There is no left ventricular hypertrophy. Left ventricular diastolic parameters were normal. Right Ventricle: The right ventricular size is normal. No increase in right ventricular wall thickness. Right ventricular systolic function is normal. Left Atrium: Left atrial size was normal in size. Right Atrium: Right atrial size was normal in size. Pericardium: There is no evidence of pericardial effusion. Mitral Valve: The mitral valve is normal in structure. No evidence of mitral valve regurgitation. No evidence of mitral valve stenosis. Tricuspid Valve: The tricuspid valve is normal in structure. Tricuspid valve regurgitation is trivial. No evidence of tricuspid stenosis. Aortic Valve: The aortic valve is normal in structure. Aortic valve regurgitation is not visualized. No aortic stenosis is present. Pulmonic Valve: The pulmonic valve was normal in structure. Pulmonic valve regurgitation is not visualized. No evidence of pulmonic stenosis. Aorta: The aortic root is normal in size and structure. Venous: The inferior vena cava is normal in size with greater than 50% respiratory variability, suggesting right atrial pressure of 3 mmHg. IAS/Shunts: No atrial level shunt detected by color flow Doppler. Additional Comments: No bubble study performed.  LEFT VENTRICLE PLAX 2D LVIDd:         4.90 cm     Diastology LVIDs:         3.50 cm     LV e' medial:    8.48 cm/s LV PW:         1.00 cm     LV E/e' medial:  7.5  LV IVS:        0.85 cm     LV e' lateral:   16.20 cm/s LVOT diam:     2.10 cm     LV E/e' lateral: 3.9 LV SV:         71 LV SV Index:   36 LVOT Area:     3.46 cm                             3D Volume EF: LV Volumes (MOD)  3D EF:        52 % LV vol d, MOD A2C: 49.6 ml LV EDV:       158 ml LV vol d, MOD A4C: 75.6 ml LV ESV:       76 ml LV vol s, MOD A2C: 45.6 ml LV SV:        82 ml LV vol s, MOD A4C: 33.0 ml LV SV MOD A2C:     4.0 ml LV SV MOD A4C:     75.6 ml LV SV MOD BP:      6.6 ml RIGHT VENTRICLE             IVC RV S prime:     12.70 cm/s  IVC diam: 1.80 cm TAPSE (M-mode): 2.1 cm LEFT ATRIUM           Index        RIGHT ATRIUM           Index LA diam:      3.70 cm 1.86 cm/m   RA Area:     15.80 cm LA Vol (A2C): 55.6 ml 27.89 ml/m  RA Volume:   42.30 ml  21.22 ml/m LA Vol (A4C): 17.4 ml 8.73 ml/m  AORTIC VALVE LVOT Vmax:   114.00 cm/s LVOT Vmean:  67.100 cm/s LVOT VTI:    0.205 m  AORTA Ao Root diam: 3.40 cm Ao Asc diam:  3.00 cm MITRAL VALVE MV Area (PHT): 2.80 cm    SHUNTS MV Decel Time: 271 msec    Systemic VTI:  0.20 m MV E velocity: 63.30 cm/s  Systemic Diam: 2.10 cm MV A velocity: 46.00 cm/s MV E/A ratio:  1.38 Charlton Haws MD Electronically signed by Charlton Haws MD Signature Date/Time: 04/08/2021/12:25:51 PM    Final       HISTORY OF PRESENT ILLNESS  Levi Davis is a 65 y.o. male with PMH significant for pAfibb not on AC for low CHADs2-Vasc score, HLD who presented with BL arm weakness and numbness and in persistent Afibb. He finished a 3 mile hike in the morning with his dog and when he got back to his truck, had an episode of cross eyes with R hand clumsiness with trouble with fine motor skills and left hand numbness. This lasted 10-12 mins and resolved.   He spoke to his wife and came in to the ED.   MRI Brain w/o contrast was negative for an acute stroke. MRA head demonstrated concerns for acute distal basilar thrombosis and CTA demonstrated basilar thrombus at the tip of the  basilar artery. BL PCA P1 segments are patent.    HOSPITAL COURSE Unremarkable. Patient remained asymptomatic throughout. He was started on heparin drip initially overnight then converted to eliquis twice daily. He was strongly advised to take both eliquis and a cholesterol lowering agent.  He is  to follow with his cardiologist as well as [facility] as outpatient. We spoke to patient's wife on day of discharge regarding all of his plan of care.  Post circulation TIA secondary to basilar tip embolism with subtotal occlusion secondary to atrial fibrillation while not being on anticoagulation.   Asymptomatic chronic left carotid occlusion with MRI showing silent left watershed tiny infarcts  code Stroke  CT head No acute abnormality.  Small vessel disease. Atrophy.  ASPECTS 10.     CTA head:                    1. Occlusion of the left  internal carotid artery from its origin to the distal cavernous segment. 2. Occlusion of the most distal aspect of the basilar artery. Both PCA P1 segments remain opacified, suggesting that the thrombosis is nonocclusive. No visible posterior communicating artery. 3. No other intracranial arterial occlusion or high-grade stenosis.     CTA NECK:    RIGHT CAROTID SYSTEM: Normal without aneurysm, dissection or stenosis.   LEFT CAROTID SYSTEM: The left ICA is occluded at its origin and remains occluded to the distal cavernous segment.   VERTEBRAL ARTERIES: Left dominant configuration. Both origins are clearly patent. There is no dissection, occlusion or flow-limiting stenosis to the skull base (V1-V3 segments). CTA Brain : thrombosis of the most distal aspect of the basilar artery. Both PCA P1 segments remain opacified, suggesting that the thrombosis is nonocclusive. MRI BRAIN:   1. Non opacification of the left intracranial internal carotid artery from the skull base to the supraclinoid segment, of indeterminate acuity. 2. Focal non opacification of the distal  basilar artery, concerning for acute thrombus. This results in poor opacification of the right superior cerebellar artery and stenosis of the origin of the bilateral PCAs, with poor opacification of the distal PCAs. 3. Diminutive left A1 and M1 segments, with normal distal ACA and MCA branches. 4. Poor opacification of the distal right vertebral artery. 5. No acute infarct or hemorrhage.   MRA  HEAD: 1. Non opacification of the left intracranial internal carotid artery from the skull base to the supraclinoid segment, of indeterminate acuity. 2. Focal non opacification of the distal basilar artery, concerning for acute thrombus. This results in poor opacification of the right superior cerebellar artery and stenosis of the origin of the bilateral PCAs, with poor opacification of the distal PCAs. 3. Diminutive left A1 and M1 segments, with normal distal ACA and MCA branches. 4. Poor opacification of the distal right vertebral artery. 5. No acute infarct or hemorrhage.     2D Echo abnormal septal motion.  Ejection fraction 55 to 60%.  Left atrial size is normal.  No clot noted.  LDL 141 HgbA1c 5.1  No antithrombotic prior to admission, now on Eliquis (apixaban) daily.   Therapy recommendations:  home no PT Disposition:  home    Hypertension Home meds:  none Stable Permissive hypertension (OK if < 220/120) but gradually normalize in 5-7 days Long-term BP goal normotensive   Hyperlipidemia Home meds:  none,  LDL 141, goal < 70 Add lipitor  daily  High intensity statin started in hospital Continue statin at discharge   HgbA1c 5.1, goal < 7.0 CBGs  Recent Labs (last 2 labs)   No results for input(s): GLUCAP in the last 72 hours.    SSI   Other Stroke Risk Factors Atrial fibrillation      RN Pressure Injury Documentation:     DISCHARGE EXAM Blood pressure 118/83, pulse (!) 50, temperature 98 F (36.7 C), temperature source Oral, resp. rate 13, height 6' (1.829  m), weight 77.6 kg, SpO2 97 %.    Pleasant well-built middle-aged Caucasian male not in distress. . Afebrile. Head is nontraumatic. Neck is supple without bruit.    Cardiac exam no murmur or gallop. Lungs are clear to auscultation. Distal pulses are well felt.   Mental status/Cognition: Alert, oriented to self, place, month and year, good attention.  Speech/language: Fluent, comprehension intact, object naming intact, repetition intact.  Cranial nerves:   CN II Pupils equal and reactive to light, no VF deficits    CN III,IV,VI EOM  intact, no gaze preference or deviation, no nystagmus    CN V normal sensation in V1, V2, and V3 segments bilaterally    CN VII no asymmetry, no nasolabial fold flattening    CN VIII normal hearing to speech    CN IX & X normal palatal elevation, no uvular deviation    CN XI 5/5 head turn and 5/5 shoulder shrug bilaterally    CN XII midline tongue protrusion    Coordination/Complex Motor:  - Finger to Nose intact BL - Heel to shin intact BL - Rapid alternating movement normal   Discharge Diet       Diet   Diet Heart Room service appropriate? Yes; Fluid consistency: Thin   liquids  DISCHARGE PLAN Disposition:   home Eliquis (apixaban) daily for secondary stroke prevention  Ongoing stroke risk factor control by Primary Care Physician at time of discharge Follow-up PCP Patient, No Pcp Per (Inactive) in 2 weeks. Follow-up in [facility] in 8 weeks, office to schedule an appointment.   45 minutes were spent preparing discharge.  I have personally obtained history,examined this patient, reviewed notes, independently viewed imaging studies, participated in medical decision making and plan of care.ROS completed by me personally and pertinent positives fully documented  I have made any additions or clarifications directly to the above note. Agree with note above.    Delia Heady, MD Medical Director [facility] Pager: [phone] 04/09/2021 2:22 PM

## 2021-04-09 NOTE — Discharge Summary (Signed)
Physician Discharge Summary  Shivam Mestas WCH:[id number] DOB: [birth year] DOA: 04/07/2021  PCP: Patient, No Pcp Per (Inactive)  Admit date: 04/07/2021 Discharge date: 04/09/2021  Admitted From: Home Disposition:  Home  Recommendations for Outpatient Follow-up:  Follow up with PCP in 1-2 weeks Please obtain BMP/CBC in one week Please follow up with neurology/cardiology as scheduled  Discharge Condition:Stable  CODE STATUS:Full  Diet recommendation: Low salt/fat diet    Brief/Interim Summary:  TIA, acute infarct ruled out Rule out acute basilar artery thrombus -Bilateral arm numbness and weakness resolved since presentation to the ED -CT head unremarkable for acute process or bleed -MRI unremarkable for acute ischemic infarct -MRA worrisome for basilar artery thrombus given lack of opacification -Neurology following, appreciate insight and recommendations, will initiate Eliquis per their recommendations for A. fib and concerning findings on MRA -Unfortunately patient and wife not agreeable to continue all medications as requested by neurology most notably including statin despite discussion on improved outcomes.  We will continue to recommend initiating this medication, possibly discussing with patient's PCP and outpatient cardiologist will be useful.   Paroxysmal A. fib, provoked RVR -Possibly provoked in the setting of covid but unclear if acute/active infection vs incidental finding given lack of symptoms and no recent testing. -Patient was presumed positive for COVID in April of this year but concurrent prolonged positive testing 6 months out would be quite rare -Rate well controlled at this time, continue metoprolol 5 IV as needed sustained heart rate greater than 140 -CHA2DS2-VASc low; given paroxysmal A. fib with apparently low burden patient was not previously on anticoagulation - cardiology consulted in the emergency department at [facility], appreciate insight and  recommendations   COVID positive status -Incidental - no respiratory symptoms - no indication for treatment at this time -Questionably provocation of above afib/rvr and possibly thrombus -Inflammatory markers essentially WNL   Hyperlipidemia -Continue statin and improved diet - patient/wife do not want to initiate statin - will prescribe in case he changes his mind as below  Discharge Instructions  Discharge Instructions     Amb referral to [facility]   Complete by: As directed    Call MD for:  difficulty breathing, headache or visual disturbances   Complete by: As directed    Call MD for:  extreme fatigue   Complete by: As directed    Call MD for:  persistant dizziness or light-headedness   Complete by: As directed    Call MD for:  severe uncontrolled pain   Complete by: As directed    Diet - low sodium heart healthy   Complete by: As directed    Increase activity slowly   Complete by: As directed       Allergies as of 04/09/2021   No Known Allergies      Medication List     STOP taking these medications    atenolol 50 MG tablet Commonly known as: TENORMIN       TAKE these medications    apixaban 5 MG Tabs tablet Commonly known as: ELIQUIS Take 1 tablet (5 mg total) by mouth 2 (two) times daily.   atorvastatin 80 MG tablet Commonly known as: LIPITOR Take 1 tablet (80 mg total) by mouth daily.   co-enzyme Q-10 30 MG capsule Take 30 mg by mouth daily.   Fish Oil 1000 MG Caps Take 2,000 mg by mouth daily.   Krill Oil 1000 MG Caps Take 2 capsules by mouth 2 (two) times daily.   medium chain triglycerides oil  Commonly known as: MCT OIL Take 15 mLs by mouth daily.   Multaq 400 MG tablet Generic drug: dronedarone TAKE ONE-HALF (1/2) TABLET TWICE A DAY WITH MEALS What changed:  how much to take how to take this when to take this additional instructions   Probiotic-10 Caps Take 2-3 capsules by mouth daily.   VITAMIN B-12 PO Take 2,500 mcg by  mouth daily.   Vitamin D3 125 MCG (5000 UT) Caps Take 5,000 Units by mouth daily.        No Known Allergies  Consultations: Neurology  Procedures/Studies: CT ANGIO HEAD NECK W WO CM  Result Date: 04/07/2021 CLINICAL DATA:  Fine motor control problems with the right hand some numbness tingling to the left hand and blurred visual changes. Patient has a history of atrial fibrillation EXAM: CT ANGIOGRAPHY HEAD AND NECK TECHNIQUE: Multidetector CT imaging of the head and neck was performed using the standard protocol during bolus administration of intravenous contrast. Multiplanar CT image reconstructions and MIPs were obtained to evaluate the vascular anatomy. Carotid stenosis measurements (when applicable) are obtained utilizing NASCET criteria, using the distal internal carotid diameter as the denominator. CONTRAST:  5mL OMNIPAQUE IOHEXOL 350 MG/ML SOLN COMPARISON:  None. FINDINGS: CTA NECK FINDINGS SKELETON: There is no bony spinal canal stenosis. No lytic or blastic lesion. OTHER NECK: Normal pharynx, larynx and major salivary glands. No cervical lymphadenopathy. Unremarkable thyroid gland. UPPER CHEST: No pneumothorax or pleural effusion. No nodules or masses. AORTIC ARCH: There is no calcific atherosclerosis of the aortic arch. There is no aneurysm, dissection or hemodynamically significant stenosis of the visualized portion of the aorta. Conventional 3 vessel aortic branching pattern. The visualized proximal subclavian arteries are widely patent. RIGHT CAROTID SYSTEM: Normal without aneurysm, dissection or stenosis. LEFT CAROTID SYSTEM: The left ICA is occluded at its origin and remains occluded to the distal cavernous segment. VERTEBRAL ARTERIES: Left dominant configuration. Both origins are clearly patent. There is no dissection, occlusion or flow-limiting stenosis to the skull base (V1-V3 segments). CTA HEAD FINDINGS POSTERIOR CIRCULATION: --Vertebral arteries: Mild atherosclerotic  calcification on the left without stenosis. --Inferior cerebellar arteries: Normal. --Basilar artery: There is thrombosis of the most distal aspect of the basilar artery. Both PCA P1 segments remain opacified, suggesting that the thrombosis is nonocclusive. --Superior cerebellar arteries: Normal. --Posterior cerebral arteries (PCA): Normal. ANTERIOR CIRCULATION: --Intracranial internal carotid arteries: Left is occluded proximally with reconstitution at the distal cavernous segment. The right is normal aside from mild atherosclerotic calcification. --Anterior cerebral arteries (ACA): Normal. Both A1 segments are present. Patent anterior communicating artery (a-comm). --Middle cerebral arteries (MCA): Normal. VENOUS SINUSES: As permitted by contrast timing, patent. ANATOMIC VARIANTS: None Review of the MIP images confirms the above findings. IMPRESSION: 1. Occlusion of the left internal carotid artery from its origin to the distal cavernous segment. 2. Occlusion of the most distal aspect of the basilar artery. Both PCA P1 segments remain opacified, suggesting that the thrombosis is nonocclusive. No visible posterior communicating artery. 3. No other intracranial arterial occlusion or high-grade stenosis. Electronically Signed   By: Deatra Robinson M.D.   On: 04/07/2021 20:33   CT HEAD WO CONTRAST  Result Date: 04/07/2021 CLINICAL DATA:  Neuro deficit, stroke suspected lightheaded EXAM: CT HEAD WITHOUT CONTRAST TECHNIQUE: Contiguous axial images were obtained from the base of the skull through the vertex without intravenous contrast. COMPARISON:  None. FINDINGS: Brain: No evidence of acute infarction, hemorrhage, cerebral edema, mass, mass effect, or midline shift. Ventricles and sulci are within normal limits  for age. No extra-axial fluid collection. Vascular: No hyperdense vessel or unexpected calcification. Skull: Normal. Negative for fracture or focal lesion. Sinuses/Orbits: No acute finding. Other: The mastoid  air cells are well aerated. IMPRESSION: No acute intracranial process. Electronically Signed   By: Wiliam Ke M.D.   On: 04/07/2021 14:28   MR ANGIO HEAD WO CONTRAST  Result Date: 04/07/2021 CLINICAL DATA:  Neuro deficit, stroke suspected EXAM: MRI HEAD WITHOUT CONTRAST MRA HEAD WITHOUT CONTRAST TECHNIQUE: Multiplanar, multi-echo pulse sequences of the brain and surrounding structures were acquired without intravenous contrast. Angiographic images of the Circle of Willis were acquired using MRA technique without intravenous contrast. COMPARISON:  No prior MRI, correlation is made with same day head CT 04/07/2021 FINDINGS: MRI HEAD FINDINGS Brain: No acute infarction, hemorrhage, hydrocephalus, extra-axial collection, or mass lesion. The ventricles and sulci are within normal limits for age. Scattered T2 hyperintense signal in the left periventricular white matter, suggestive of a watershed distribution, sessile central likely the sequela of mild chronic small vessel ischemic disease. Focus of susceptibility in the right cerebellar hemisphere, likely sequela of prior hypertensive microhemorrhage. Vascular: Normal flow voids. Skull and upper cervical spine: Normal marrow signal. Sinuses/Orbits: Negative. Other: Trace fluid in the mastoid air cells. MRA HEAD FINDINGS Anterior circulation: The left intracranial internal carotid artery is not visualized until the supraclinoid segment. The right intracranial internal carotid artery is patent to the ICA terminus. Diminutive left A1 and M1 segments, compared to the right side. Normal right MCA. Distal MCA branches are perfused bilaterally and grossly symmetric. Visualization of the anterior communicating artery is limited. Posterior circulation: Focal non opacification at the superior extent of the basilar artery (series 19, images 93-99 and series 106, image 146). The left superior cerebellar poor visualization of the right superior cerebellar artery artery origin  is patent. Narrowing of the origins of the bilateral PCAs, with poor flow distally. Diminutive left posterior communicating artery is seen. The right posterior communicating artery is not visualized. Left dominant vertebral system. Poor visualization of the distal right V4, proximal to the vertebrobasilar junction. Patent left vertebral artery. The bilateral PICAs are visualized. Anatomic variants: None significant IMPRESSION: 1. Non opacification of the left intracranial internal carotid artery from the skull base to the supraclinoid segment, of indeterminate acuity. 2. Focal non opacification of the distal basilar artery, concerning for acute thrombus. This results in poor opacification of the right superior cerebellar artery and stenosis of the origin of the bilateral PCAs, with poor opacification of the distal PCAs. 3. Diminutive left A1 and M1 segments, with normal distal ACA and MCA branches. 4. Poor opacification of the distal right vertebral artery. 5. No acute infarct or hemorrhage. These results were called by telephone at the time of interpretation on 04/07/2021 at 5:07 pm to provider Dr. Lynelle Doctor, Who verbally acknowledged these results. Electronically Signed   By: Wiliam Ke M.D.   On: 04/07/2021 17:09   MR Brain Wo Contrast (neuro protocol)  Result Date: 04/07/2021 CLINICAL DATA:  Neuro deficit, stroke suspected EXAM: MRI HEAD WITHOUT CONTRAST MRA HEAD WITHOUT CONTRAST TECHNIQUE: Multiplanar, multi-echo pulse sequences of the brain and surrounding structures were acquired without intravenous contrast. Angiographic images of the Circle of Willis were acquired using MRA technique without intravenous contrast. COMPARISON:  No prior MRI, correlation is made with same day head CT 04/07/2021 FINDINGS: MRI HEAD FINDINGS Brain: No acute infarction, hemorrhage, hydrocephalus, extra-axial collection, or mass lesion. The ventricles and sulci are within normal limits for age. Scattered T2 hyperintense  signal  in the left periventricular white matter, suggestive of a watershed distribution, sessile central likely the sequela of mild chronic small vessel ischemic disease. Focus of susceptibility in the right cerebellar hemisphere, likely sequela of prior hypertensive microhemorrhage. Vascular: Normal flow voids. Skull and upper cervical spine: Normal marrow signal. Sinuses/Orbits: Negative. Other: Trace fluid in the mastoid air cells. MRA HEAD FINDINGS Anterior circulation: The left intracranial internal carotid artery is not visualized until the supraclinoid segment. The right intracranial internal carotid artery is patent to the ICA terminus. Diminutive left A1 and M1 segments, compared to the right side. Normal right MCA. Distal MCA branches are perfused bilaterally and grossly symmetric. Visualization of the anterior communicating artery is limited. Posterior circulation: Focal non opacification at the superior extent of the basilar artery (series 19, images 93-99 and series 106, image 146). The left superior cerebellar poor visualization of the right superior cerebellar artery artery origin is patent. Narrowing of the origins of the bilateral PCAs, with poor flow distally. Diminutive left posterior communicating artery is seen. The right posterior communicating artery is not visualized. Left dominant vertebral system. Poor visualization of the distal right V4, proximal to the vertebrobasilar junction. Patent left vertebral artery. The bilateral PICAs are visualized. Anatomic variants: None significant IMPRESSION: 1. Non opacification of the left intracranial internal carotid artery from the skull base to the supraclinoid segment, of indeterminate acuity. 2. Focal non opacification of the distal basilar artery, concerning for acute thrombus. This results in poor opacification of the right superior cerebellar artery and stenosis of the origin of the bilateral PCAs, with poor opacification of the distal PCAs. 3.  Diminutive left A1 and M1 segments, with normal distal ACA and MCA branches. 4. Poor opacification of the distal right vertebral artery. 5. No acute infarct or hemorrhage. These results were called by telephone at the time of interpretation on 04/07/2021 at 5:07 pm to provider Dr. Lynelle Doctor, Who verbally acknowledged these results. Electronically Signed   By: Wiliam Ke M.D.   On: 04/07/2021 17:09   DG Chest Port 1 View  Result Date: 04/07/2021 CLINICAL DATA:  Chest pain EXAM: PORTABLE CHEST 1 VIEW COMPARISON:  None. FINDINGS: The cardiomediastinal silhouette is within normal limits. There is no focal consolidation or pulmonary edema. There is no pleural effusion or pneumothorax. There is no acute osseous abnormality. IMPRESSION: No radiographic evidence of acute cardiopulmonary process. Electronically Signed   By: Lesia Hausen M.D.   On: 04/07/2021 13:55   ECHOCARDIOGRAM COMPLETE  Result Date: 04/08/2021    ECHOCARDIOGRAM REPORT   Patient Name:   [facility] Date of Exam: 04/08/2021 Medical Rec #:  [id number]    Height:       72.0 in Accession #:    [id number]   Weight:       171.0 lb Date of Birth:  September 02, 1955    BSA:          1.993 m Patient Age:    65 years     BP:           111/80 mmHg Patient Gender: M            HR:           68 bpm. Exam Location:  Inpatient Procedure: 2D Echo, 3D Echo, Cardiac Doppler and Color Doppler Indications:    Stroke  History:        Patient has prior history of Echocardiogram examinations, most  recent 11/27/2018. Abnormal ECG, Arrythmias:Atrial Fibrillation;                 Risk Factors:Dyslipidemia.  Sonographer:    Sheralyn Boatman RDCS Referring Phys: [id number] [facility] IMPRESSIONS  1. No bubble study performed.  2. Abnormal septal motion. Left ventricular ejection fraction, by estimation, is 55 to 60%. The left ventricle has normal function. The left ventricle has no regional wall motion abnormalities. Left ventricular diastolic parameters were normal.   3. Right ventricular systolic function is normal. The right ventricular size is normal.  4. The mitral valve is normal in structure. No evidence of mitral valve regurgitation. No evidence of mitral stenosis.  5. The aortic valve is normal in structure. Aortic valve regurgitation is not visualized. No aortic stenosis is present.  6. The inferior vena cava is normal in size with greater than 50% respiratory variability, suggesting right atrial pressure of 3 mmHg. FINDINGS  Left Ventricle: Abnormal septal motion. Left ventricular ejection fraction, by estimation, is 55 to 60%. The left ventricle has normal function. The left ventricle has no regional wall motion abnormalities. The left ventricular internal cavity size was normal in size. There is no left ventricular hypertrophy. Left ventricular diastolic parameters were normal. Right Ventricle: The right ventricular size is normal. No increase in right ventricular wall thickness. Right ventricular systolic function is normal. Left Atrium: Left atrial size was normal in size. Right Atrium: Right atrial size was normal in size. Pericardium: There is no evidence of pericardial effusion. Mitral Valve: The mitral valve is normal in structure. No evidence of mitral valve regurgitation. No evidence of mitral valve stenosis. Tricuspid Valve: The tricuspid valve is normal in structure. Tricuspid valve regurgitation is trivial. No evidence of tricuspid stenosis. Aortic Valve: The aortic valve is normal in structure. Aortic valve regurgitation is not visualized. No aortic stenosis is present. Pulmonic Valve: The pulmonic valve was normal in structure. Pulmonic valve regurgitation is not visualized. No evidence of pulmonic stenosis. Aorta: The aortic root is normal in size and structure. Venous: The inferior vena cava is normal in size with greater than 50% respiratory variability, suggesting right atrial pressure of 3 mmHg. IAS/Shunts: No atrial level shunt detected by color  flow Doppler. Additional Comments: No bubble study performed.  LEFT VENTRICLE PLAX 2D LVIDd:         4.90 cm     Diastology LVIDs:         3.50 cm     LV e' medial:    8.48 cm/s LV PW:         1.00 cm     LV E/e' medial:  7.5 LV IVS:        0.85 cm     LV e' lateral:   16.20 cm/s LVOT diam:     2.10 cm     LV E/e' lateral: 3.9 LV SV:         71 LV SV Index:   36 LVOT Area:     3.46 cm                             3D Volume EF: LV Volumes (MOD)           3D EF:        52 % LV vol d, MOD A2C: 49.6 ml LV EDV:       158 ml LV vol d, MOD A4C: 75.6 ml LV ESV:  76 ml LV vol s, MOD A2C: 45.6 ml LV SV:        82 ml LV vol s, MOD A4C: 33.0 ml LV SV MOD A2C:     4.0 ml LV SV MOD A4C:     75.6 ml LV SV MOD BP:      6.6 ml RIGHT VENTRICLE             IVC RV S prime:     12.70 cm/s  IVC diam: 1.80 cm TAPSE (M-mode): 2.1 cm LEFT ATRIUM           Index        RIGHT ATRIUM           Index LA diam:      3.70 cm 1.86 cm/m   RA Area:     15.80 cm LA Vol (A2C): 55.6 ml 27.89 ml/m  RA Volume:   42.30 ml  21.22 ml/m LA Vol (A4C): 17.4 ml 8.73 ml/m  AORTIC VALVE LVOT Vmax:   114.00 cm/s LVOT Vmean:  67.100 cm/s LVOT VTI:    0.205 m  AORTA Ao Root diam: 3.40 cm Ao Asc diam:  3.00 cm MITRAL VALVE MV Area (PHT): 2.80 cm    SHUNTS MV Decel Time: 271 msec    Systemic VTI:  0.20 m MV E velocity: 63.30 cm/s  Systemic Diam: 2.10 cm MV A velocity: 46.00 cm/s MV E/A ratio:  1.38 Charlton Haws MD Electronically signed by Charlton Haws MD Signature Date/Time: 04/08/2021/12:25:51 PM    Final      Subjective: No acute issues/events overnight   Discharge Exam: Vitals:   04/09/21 0700 04/09/21 0800  BP: 102/68 118/83  Pulse: (!) 51 (!) 50  Resp: 13 13  Temp:  98 F (36.7 C)  SpO2: 97% 97%   Vitals:   04/09/21 0500 04/09/21 0600 04/09/21 0700 04/09/21 0800  BP: 109/74 101/71 102/68 118/83  Pulse: (!) 45 (!) 51 (!) 51 (!) 50  Resp: 16 15 13 13   Temp:    98 F (36.7 C)  TempSrc:    Oral  SpO2: 97% 96% 97% 97%  Weight:       Height:        General: Pt is alert, awake, not in acute distress Cardiovascular: RRR, S1/S2 +, no rubs, no gallops Respiratory: CTA bilaterally, no wheezing, no rhonchi Abdominal: Soft, NT, ND, bowel sounds + Extremities: no edema, no cyanosis    The results of significant diagnostics from this hospitalization (including imaging, microbiology, ancillary and laboratory) are listed below for reference.     Microbiology: Recent Results (from the past 240 hour(s))  Resp Panel by RT-PCR (Flu A&B, Covid) Nasopharyngeal Swab     Status: Abnormal   Collection Time: 04/07/21  2:51 PM   Specimen: Nasopharyngeal Swab; Nasopharyngeal(NP) swabs in vial transport medium  Result Value Ref Range Status   SARS Coronavirus 2 by RT PCR POSITIVE (A) NEGATIVE Final    Comment: RESULT CALLED TO, READ BACK BY AND VERIFIED WITH: CHANY,A RN @1804  ON 04/07/21 JACKSON,K (NOTE) SARS-CoV-2 target nucleic acids are DETECTED.  The SARS-CoV-2 RNA is generally detectable in upper respiratory specimens during the acute phase of infection. Positive results are indicative of the presence of the identified virus, but do not rule out bacterial infection or co-infection with other pathogens not detected by the test. Clinical correlation with patient history and other diagnostic information is necessary to determine patient infection status. The expected result is Negative.  Fact Sheet  for Patients: BloggerCourse.com  Fact Sheet for Healthcare Providers: SeriousBroker.it  This test is not yet approved or cleared by the [place] FDA and  has been authorized for detection and/or diagnosis of SARS-CoV-2 by FDA under an Emergency Use Authorization (EUA).  This EUA will remain in effect (meaning this test c an be used) for the duration of  the COVID-19 declaration under Section 564(b)(1) of the Act, 21 U.S.C. section 360bbb-3(b)(1), unless the authorization  is terminated or revoked sooner.     Influenza A by PCR NEGATIVE NEGATIVE Final   Influenza B by PCR NEGATIVE NEGATIVE Final    Comment: (NOTE) The Xpert Xpress SARS-CoV-2/FLU/RSV plus assay is intended as an aid in the diagnosis of influenza from Nasopharyngeal swab specimens and should not be used as a sole basis for treatment. Nasal washings and aspirates are unacceptable for Xpert Xpress SARS-CoV-2/FLU/RSV testing.  Fact Sheet for Patients: BloggerCourse.com  Fact Sheet for Healthcare Providers: SeriousBroker.it  This test is not yet approved or cleared by the [place] FDA and has been authorized for detection and/or diagnosis of SARS-CoV-2 by FDA under an Emergency Use Authorization (EUA). This EUA will remain in effect (meaning this test can be used) for the duration of the COVID-19 declaration under Section 564(b)(1) of the Act, 21 U.S.C. section 360bbb-3(b)(1), unless the authorization is terminated or revoked.  Performed at [facility], [address], [place], [place] [address]   MRSA Next Gen by PCR, Nasal     Status: None   Collection Time: 04/07/21  9:44 PM   Specimen: Nasal Mucosa; Nasal Swab  Result Value Ref Range Status   MRSA by PCR Next Gen NOT DETECTED NOT DETECTED Final    Comment: (NOTE) The GeneXpert MRSA Assay (FDA approved for NASAL specimens only), is one component of a comprehensive MRSA colonization surveillance program. It is not intended to diagnose MRSA infection nor to guide or monitor treatment for MRSA infections. Test performance is not FDA approved in patients less than 29 years old. Performed at [facility], [address] [place], [place] [address]      Labs: BNP (last 3 results) No results for input(s): BNP in the last 8760 hours. Basic Metabolic Panel: Recent Labs  Lab 04/07/21 1333  NA 139  K 4.1  CL 106  CO2 26  GLUCOSE 92  BUN 19   CREATININE 0.89  CALCIUM 9.4  MG 2.1   Liver Function Tests: Recent Labs  Lab 04/07/21 1333  AST 21  ALT 17  ALKPHOS 50  BILITOT 1.0  PROT 7.5  ALBUMIN 4.6   No results for input(s): LIPASE, AMYLASE in the last 168 hours. No results for input(s): AMMONIA in the last 168 hours. CBC: Recent Labs  Lab 04/07/21 1333 04/08/21 0225  WBC 7.2 5.7  NEUTROABS 5.3  --   HGB 14.5 14.3  HCT 42.4 41.8  MCV 95.7 94.6  PLT 176 183   Cardiac Enzymes: No results for input(s): CKTOTAL, CKMB, CKMBINDEX, TROPONINI in the last 168 hours. BNP: Invalid input(s): POCBNP CBG: No results for input(s): GLUCAP in the last 168 hours. D-Dimer Recent Labs    04/07/21 1912  DDIMER 0.54*   Hgb A1c Recent Labs    04/08/21 0225  HGBA1C 5.1   Lipid Profile Recent Labs    04/08/21 0225  CHOL 222*  HDL 76  LDLCALC 141*  TRIG 24  CHOLHDL 2.9   Thyroid function studies No results for input(s): TSH, T4TOTAL, T3FREE,  THYROIDAB in the last 72 hours.  Invalid input(s): FREET3 Anemia work up Recent Labs    04/07/21 1912  FERRITIN 51   Urinalysis    Component Value Date/Time   COLORURINE COLORLESS (A) 04/07/2021 0215   APPEARANCEUR CLEAR 04/07/2021 0215   LABSPEC 1.003 (L) 04/07/2021 0215   PHURINE 5.0 04/07/2021 0215   GLUCOSEU NEGATIVE 04/07/2021 0215   HGBUR NEGATIVE 04/07/2021 0215   BILIRUBINUR NEGATIVE 04/07/2021 0215   KETONESUR NEGATIVE 04/07/2021 0215   PROTEINUR NEGATIVE 04/07/2021 0215   NITRITE NEGATIVE 04/07/2021 0215   LEUKOCYTESUR NEGATIVE 04/07/2021 0215   Sepsis Labs Invalid input(s): PROCALCITONIN,  WBC,  LACTICIDVEN Microbiology Recent Results (from the past 240 hour(s))  Resp Panel by RT-PCR (Flu A&B, Covid) Nasopharyngeal Swab     Status: Abnormal   Collection Time: 04/07/21  2:51 PM   Specimen: Nasopharyngeal Swab; Nasopharyngeal(NP) swabs in vial transport medium  Result Value Ref Range Status   SARS Coronavirus 2 by RT PCR POSITIVE (A) NEGATIVE  Final    Comment: RESULT CALLED TO, READ BACK BY AND VERIFIED WITH: CHANY,A RN @1804  ON 04/07/21 JACKSON,K (NOTE) SARS-CoV-2 target nucleic acids are DETECTED.  The SARS-CoV-2 RNA is generally detectable in upper respiratory specimens during the acute phase of infection. Positive results are indicative of the presence of the identified virus, but do not rule out bacterial infection or co-infection with other pathogens not detected by the test. Clinical correlation with patient history and other diagnostic information is necessary to determine patient infection status. The expected result is Negative.  Fact Sheet for Patients: 06/07/21  Fact Sheet for Healthcare Providers: BloggerCourse.com  This test is not yet approved or cleared by the SeriousBroker.it FDA and  has been authorized for detection and/or diagnosis of SARS-CoV-2 by FDA under an Emergency Use Authorization (EUA).  This EUA will remain in effect (meaning this test c an be used) for the duration of  the COVID-19 declaration under Section 564(b)(1) of the Act, 21 U.S.C. section 360bbb-3(b)(1), unless the authorization is terminated or revoked sooner.     Influenza A by PCR NEGATIVE NEGATIVE Final   Influenza B by PCR NEGATIVE NEGATIVE Final    Comment: (NOTE) The Xpert Xpress SARS-CoV-2/FLU/RSV plus assay is intended as an aid in the diagnosis of influenza from Nasopharyngeal swab specimens and should not be used as a sole basis for treatment. Nasal washings and aspirates are unacceptable for Xpert Xpress SARS-CoV-2/FLU/RSV testing.  Fact Sheet for Patients: [place]  Fact Sheet for Healthcare Providers: BloggerCourse.com  This test is not yet approved or cleared by the SeriousBroker.it FDA and has been authorized for detection and/or diagnosis of SARS-CoV-2 by FDA under an Emergency Use Authorization  (EUA). This EUA will remain in effect (meaning this test can be used) for the duration of the COVID-19 declaration under Section 564(b)(1) of the Act, 21 U.S.C. section 360bbb-3(b)(1), unless the authorization is terminated or revoked.  Performed at [facility], [address], [place], [place]   MRSA Next Gen by PCR, Nasal     Status: None   Collection Time: 04/07/21  9:44 PM   Specimen: Nasal Mucosa; Nasal Swab  Result Value Ref Range Status   MRSA by PCR Next Gen NOT DETECTED NOT DETECTED Final    Comment: (NOTE) The GeneXpert MRSA Assay (FDA approved for NASAL specimens only), is one component of a comprehensive MRSA colonization surveillance program. It is not intended to diagnose MRSA infection nor to guide or monitor treatment  for MRSA infections. Test performance is not FDA approved in patients less than 34 years old. Performed at [facility], [address] [place], [place] [address]      Time coordinating discharge: Over 30 minutes  SIGNED:   Azucena Fallen, DO [facility] 04/09/2021, 8:33 AM Pager   If 7PM-7AM, please contact night-coverage [email or web address]

## 2021-04-09 NOTE — Progress Notes (Signed)
D/C education given to Pt and Pt's wife, all questions answered. No printed prescriptions to give or equipment to deliver. IV removed. Pt taken to car with all belongings.

## 2021-04-16 ENCOUNTER — Telehealth: Payer: Self-pay

## 2021-04-16 ENCOUNTER — Telehealth: Payer: Self-pay | Admitting: Cardiovascular Disease

## 2021-04-16 DIAGNOSIS — I2510 Atherosclerotic heart disease of native coronary artery without angina pectoris: Secondary | ICD-10-CM

## 2021-04-16 DIAGNOSIS — E7800 Pure hypercholesterolemia, unspecified: Secondary | ICD-10-CM

## 2021-04-16 DIAGNOSIS — I480 Paroxysmal atrial fibrillation: Secondary | ICD-10-CM

## 2021-04-16 DIAGNOSIS — E782 Mixed hyperlipidemia: Secondary | ICD-10-CM

## 2021-04-16 MED ORDER — MULTAQ 400 MG PO TABS: ORAL_TABLET | ORAL | 0 refills | Status: DC

## 2021-04-16 NOTE — Telephone Encounter (Signed)
Patient would like to have advanced lipid panel,  Liporotein a (LPA) and Apolipoprotein B- lab work and a CT calcium score. Will forward to Dr. Eden Emms to see if order can be placed.

## 2021-04-16 NOTE — Telephone Encounter (Signed)
*  STAT* If patient is at the pharmacy, call can be transferred to refill team.   1. Which medications need to be refilled? (please list name of each medication and dose if known)  dronedarone (MULTAQ) 400 MG tablet  2. Which pharmacy/location (including street and city if local pharmacy) is medication to be sent to? [id number] - [place], [place] - [address]  3. Do they need a 30 day or 90 day supply? 30  Patient will not have enough medication to last him until his appt 04/28/21

## 2021-04-28 ENCOUNTER — Ambulatory Visit: Admitting: Physician Assistant

## 2021-04-29 ENCOUNTER — Other Ambulatory Visit: Payer: Self-pay

## 2021-04-29 ENCOUNTER — Other Ambulatory Visit: Admitting: *Deleted

## 2021-04-29 ENCOUNTER — Encounter: Payer: Self-pay | Admitting: Physician Assistant

## 2021-04-29 ENCOUNTER — Ambulatory Visit (INDEPENDENT_AMBULATORY_CARE_PROVIDER_SITE_OTHER): Admitting: Physician Assistant

## 2021-04-29 VITALS — BP 104/50 | HR 150 | Ht 72.0 in | Wt 172.0 lb

## 2021-04-29 DIAGNOSIS — I480 Paroxysmal atrial fibrillation: Secondary | ICD-10-CM | POA: Diagnosis not present

## 2021-04-29 DIAGNOSIS — E7800 Pure hypercholesterolemia, unspecified: Secondary | ICD-10-CM | POA: Diagnosis not present

## 2021-04-29 DIAGNOSIS — Z8673 Personal history of transient ischemic attack (TIA), and cerebral infarction without residual deficits: Secondary | ICD-10-CM

## 2021-04-29 DIAGNOSIS — I2510 Atherosclerotic heart disease of native coronary artery without angina pectoris: Secondary | ICD-10-CM

## 2021-04-29 DIAGNOSIS — I4891 Unspecified atrial fibrillation: Secondary | ICD-10-CM

## 2021-04-29 DIAGNOSIS — I6522 Occlusion and stenosis of left carotid artery: Secondary | ICD-10-CM

## 2021-04-29 DIAGNOSIS — E782 Mixed hyperlipidemia: Secondary | ICD-10-CM

## 2021-04-29 MED ORDER — MULTAQ 400 MG PO TABS: 400.0000 mg | ORAL_TABLET | Freq: Two times a day (BID) | ORAL | 3 refills | Status: DC

## 2021-04-29 MED ORDER — METOPROLOL TARTRATE 25 MG PO TABS: 25.0000 mg | ORAL_TABLET | ORAL | 2 refills | Status: DC | PRN

## 2021-04-29 NOTE — Assessment & Plan Note (Signed)
Elevated CAC score.  Most recently his CAC score was to 8.  He has another scan scheduled later this month.  He is not having anginal symptoms despite his elevated heart rate or with regular exercise

## 2021-04-29 NOTE — Assessment & Plan Note (Signed)
No recurrent symptoms.  He is not on aspirin or Plavix as he is on Apixaban.  We discussed the importance of statin therapy for lipid management.

## 2021-04-29 NOTE — Assessment & Plan Note (Signed)
Asymptomatic.  He does note that he is going to see a vascular surgeon [place] of [place] for second opinion.  He has no stenosis on the right.  As noted, he is not on antiplatelet therapy as he is on anticoagulation.  As outlined, we did discuss the importance of lipid management.

## 2021-04-29 NOTE — Progress Notes (Signed)
Cardiology Office Note:    Date:  04/29/2021   ID:  Levi Davis, DOB 1955-08-26, MRN [id number]  PCP:  Patient, No Pcp Per (Inactive)   [facility] Providers Cardiologist:  Charlton Haws, MD    Referring MD: No ref. provider found   Chief Complaint:  Hospitalization Follow-up (S/p TIA)    Patient Profile:   Levi Davis is a 65 y.o. male with:  Paroxysmal atrial fibrillation Previously not on a/c due to low stroke risk Apixaban started 10/22 due to TIA Rx: Dronedarone Hyperlipidemia Coronary artery calcification CAC score 11/2018: 192 (73rd percentile) CAC score 11/21: 138 (66 percentile) History of TIA 03/2021 Carotid artery disease Head/neck CTA 10/22: Chronic occlusion of left ICA; no stenosis of right ICA  History of Present Illness: Mr. Causby was last seen by Dr. Eden Emms in 12/2019.        He was admitted 10/11-10/13 with a posterior circulation TIA (presentation with symptoms of blurry vision and right hand clumsiness, left hand numbness) secondary to nonocclusive basilar tip thrombus noted on MRA.  He was followed by neurology.  He was in AFib with RVR upon admission.  His event was felt to likely be due to atrial fibrillation.  Long-term anticoagulation was recommended with Apixaban.  CT did demonstrate chronic LICA occlusion with tiny silent left sided watershed infarcts on MRI.  Of note, the patient was placed on high-dose atorvastatin but he declined to take this.   I have personally reviewed telemetry strips from the hospital and this did document sinus bradycardia prior to discharge.  His SARS-CoV-2 test was incidentally positive.  He returns for follow-up.  He is here alone.  Since discharge from the hospital, he has felt well.  He has not had chest pain, shortness of breath, syncope, orthopnea, leg edema.  He exercises morning on the [employer] for 45 minutes without difficulty.  He knows when he is in atrial fibrillation.  He went back into atrial fibrillation a few  hours ago.  It usually resolves on its own.  About 3 days ago, he decided to increase his dronedarone to 400 mg twice daily.  He has felt like his episodes of atrial fibrillation have been less symptomatic since then.   ASSESSMENT & PLAN:   Atrial fibrillation with RVR (HCC) He was recently admitted with a TIA.  Imaging demonstrated a nonocclusive basilar tip thrombus.  It was felt by neurology that his TIA was due to atrial fibrillation.  He did have an episode of atrial fibrillation upon admission but was in sinus rhythm at discharge.  Today, his heart rate is 150 in what appears to be an atypical atrial flutter.  He is minimally symptomatic.  He is well associated with his arrhythmia.  He wears an apple watch.  His rhythm usually returns to normal within hours.  As noted, he recently increased his dronedarone to 400 mg twice daily.  It is not clear why he was only taking 200 mg twice daily previously.  When he is in sinus rhythm, his heart rate is usually in the mid to upper 50s.  Therefore, he will not likely be able to tolerate daily beta-blocker.  I reviewed his case today with Dr. Eden Emms, his primary cardiologist, who suggested that we should refer him to EP for consideration of ablation.  -Continue dronedarone 400 mg twice daily  -The patient will contact me later today or tomorrow to apprise me of his rhythm  -If he remains in atrial fibrillation/flutter, refer to A. fib clinic  for cardioversion  -Refer to EP for consideration of ablation versus alternate antiarrhythmic drugs  -Rx: Metoprolol tartrate 25 mg daily as needed for rapid palpitations  -Continue apixaban 5 mg twice daily  -Follow-up with Dr. Johnsie Cancel in 3 months  History of TIA (transient ischemic attack) No recurrent symptoms.  He is not on aspirin or Plavix as he is on Apixaban.  We discussed the importance of statin therapy for lipid management.  Hypercholesterolemia He is not interested in taking a statin.  He has changed his  diet drastically and is encouraged by his CAC score decreasing over time.  I did explain that his overall risk will be reduced by getting his LDL less than 70 with some type of medical therapy.  I offered him ezetimibe (Zetia).  I also offered referring him to the lipid clinic for PCSK9 inhibitor therapy.  He will think about this.  Stenosis of left carotid artery Asymptomatic.  He does note that he is going to see a vascular surgeon out of state for second opinion.  He has no stenosis on the right.  As noted, he is not on antiplatelet therapy as he is on anticoagulation.  As outlined, we did discuss the importance of lipid management.  Coronary artery disease involving native coronary artery of native heart without angina pectoris Elevated CAC score.  Most recently his CAC score was to 8.  He has another scan scheduled later this month.  He is not having anginal symptoms despite his elevated heart rate or with regular exercise           Dispo:  Return in about 3 months (around 07/30/2021) for Routine follow up in 3 months with Dr. Johnsie Cancel..    Prior CV studies: Echocardiogram 04/08/2021 EF 55-60, no RWMA, normal RVSF  Head CTA 99991111 LICA occlusion from origin to distal cavernous segment Occlusion of most distal aspect of basilar artery-suggestion of nonocclusive thrombus No RICA stenosis   CAC score 05/20/2020 CAC = 138 (66 percentile)  Stress echocardiogram 02/08/2017 [facility] [place]) Normal     Past Medical History:  Diagnosis Date   A-fib ([place]) 06/23/2018   Arrhythmia 2013   AFIB   Closed right ankle fracture    Hypercholesterolemia    Current Medications: Current Meds  Medication Sig   apixaban (ELIQUIS) 5 MG TABS tablet Take 1 tablet (5 mg total) by mouth 2 (two) times daily.   Cholecalciferol (VITAMIN D3) 125 MCG (5000 UT) CAPS Take 5,000 Units by mouth daily.   co-enzyme Q-10 30 MG capsule Take 30 mg by mouth daily.   Cyanocobalamin (VITAMIN B-12  PO) Take 2,500 mcg by mouth daily.   Krill Oil 1000 MG CAPS Take 2 capsules by mouth 2 (two) times daily.   medium chain triglycerides (MCT OIL) oil Take 15 mLs by mouth daily.   metoprolol tartrate (LOPRESSOR) 25 MG tablet Take 1 tablet (25 mg total) by mouth as needed. Increase heart rate and palpitations.   Omega-3 Fatty Acids (FISH OIL) 1000 MG CAPS Take 2,000 mg by mouth daily.   Probiotic Product (PROBIOTIC-10) CAPS Take 2-3 capsules by mouth daily.   [DISCONTINUED] dronedarone (MULTAQ) 400 MG tablet TAKE ONE-HALF (1/2) TABLET TWICE A DAY WITH MEALS    Allergies:   Patient has no known allergies.   Social History   Tobacco Use   Smoking status: Never   Smokeless tobacco: Never  Vaping Use   Vaping Use: Never used  Substance Use Topics   Alcohol use: Yes  Comment: occasional   Drug use: Never    Family Hx: The patient's family history includes Aneurysm in his sister; Anuerysm in his mother; Heart Problems in his father; High blood pressure in his father, mother, and sister; Obesity in his sister; Stroke in his mother.  Review of Systems  Gastrointestinal:  Negative for hematochezia.  Genitourinary:  Negative for hematuria.    EKGs/Labs/Other Test Reviewed:    EKG:  EKG is   ordered today.  The ekg ordered today demonstrates atrial fibrillation versus atypical atrial flutter, HR 150, left axis deviation, IVCD  Recent Labs: 04/07/2021: ALT 17; BUN 19; Creatinine, Ser 0.89; Magnesium 2.1; Potassium 4.1; Sodium 139 04/08/2021: Hemoglobin 14.3; Platelets 183   Recent Lipid Panel Lab Results  Component Value Date/Time   CHOL 222 (H) 04/08/2021 02:25 AM   TRIG 24 04/08/2021 02:25 AM   HDL 76 04/08/2021 02:25 AM   LDLCALC 141 (H) 04/08/2021 02:25 AM     Risk Assessment/Calculations:    CHA2DS2-VASc Score = 3   This indicates a 3.2% annual risk of stroke. The patient's score is based upon: CHF History: 0 HTN History: 0 Diabetes History: 0 Stroke History:  2 Vascular Disease History: 1 Age Score: 0 Gender Score: 0        Physical Exam:    VS:  BP (!) 104/50   Pulse (!) 150   Ht 6' (1.829 m)   Wt 172 lb (78 kg)   SpO2 98%   BMI 23.33 kg/m     Wt Readings from Last 3 Encounters:  04/29/21 172 lb (78 kg)  04/07/21 171 lb (77.6 kg)  01/18/20 188 lb 6.4 oz (85.5 kg)    Constitutional:      Appearance: Healthy appearance. Not in distress.  Neck:     Vascular: JVD normal.  Pulmonary:     Breath sounds: No wheezing. No rales.  Cardiovascular:     Tachycardia present. Irregularly irregular rhythm.     Murmurs: There is no murmur.  Edema:    Peripheral edema absent.  Abdominal:     Palpations: Abdomen is soft. There is no hepatomegaly.  Skin:    General: Skin is warm and dry.  Neurological:     General: No focal deficit present.     Mental Status: Alert and oriented to person, place and time.       Medication Adjustments/Labs and Tests Ordered: Current medicines are reviewed at length with the patient today.  Concerns regarding medicines are outlined above.  Tests Ordered: Orders Placed This Encounter  Procedures   Ambulatory referral to Cardiac Electrophysiology   EKG 12-Lead   Medication Changes: Meds ordered this encounter  Medications   metoprolol tartrate (LOPRESSOR) 25 MG tablet    Sig: Take 1 tablet (25 mg total) by mouth as needed. Increase heart rate and palpitations.    Dispense:  20 tablet    Refill:  2   dronedarone (MULTAQ) 400 MG tablet    Sig: Take 1 tablet (400 mg total) by mouth 2 (two) times daily with a meal.    Dispense:  180 tablet    Refill:  3   Signed, Richardson Dopp, PA-C  04/29/2021 5:04 PM    [facility] [place], [place], [place]  [address] Phone: [phone]; Fax: [phone]

## 2021-04-29 NOTE — Assessment & Plan Note (Addendum)
He was recently admitted with a TIA.  Imaging demonstrated a nonocclusive basilar tip thrombus.  It was felt by neurology that his TIA was due to atrial fibrillation.  He did have an episode of atrial fibrillation upon admission but was in sinus rhythm at discharge.  Today, his heart rate is 150 in what appears to be an atypical atrial flutter.  He is minimally symptomatic.  He is well associated with his arrhythmia.  He wears an apple watch.  His rhythm usually returns to normal within hours.  As noted, he recently increased his dronedarone to 400 mg twice daily.  It is not clear why he was only taking 200 mg twice daily previously.  When he is in sinus rhythm, his heart rate is usually in the mid to upper 50s.  Therefore, he will not likely be able to tolerate daily beta-blocker.  I reviewed his case today with Dr. Eden Emms, his primary cardiologist, who suggested that we should refer him to EP for consideration of ablation.  -Continue dronedarone 400 mg twice daily  -The patient will contact me later today or tomorrow to apprise me of his rhythm  -If he remains in atrial fibrillation/flutter, refer to A. fib clinic for cardioversion  -Refer to EP for consideration of ablation versus alternate antiarrhythmic drugs  -Rx: Metoprolol tartrate 25 mg daily as needed for rapid palpitations  -Continue apixaban 5 mg twice daily  -Follow-up with Dr. Eden Emms in 3 months

## 2021-04-29 NOTE — Patient Instructions (Signed)
Medication Instructions:   START Metoprolol tartrate one tablet by mouth ( 25 mg ) as needed for increase Heart Rate and A-Fib. Take one when you get home today.   INCREASE Multaq one tablet by mouth ( 400 mg) twice daily.    *If you need a refill on your cardiac medications before your next appointment, please call your pharmacy*   Lab Work:  -NONE  If you have labs (blood work) drawn today and your tests are completely normal, you will receive your results only by: [place] (if you have MyChart) OR A paper copy in the mail If you have any lab test that is abnormal or we need to change your treatment, we will call you to review the results.   Testing/Procedures:  -NONE   Follow-Up: At [facility], you and your health needs are our priority.  As part of our continuing mission to provide you with exceptional heart care, we have created designated Provider Care Teams.  These Care Teams include your primary Cardiologist (physician) and Advanced Practice Providers (APPs -  Physician Assistants and Nurse Practitioners) who all work together to provide you with the care you need, when you need it.  We recommend signing up for the patient portal called "MyChart".  Sign up information is provided on this After Visit Summary.  MyChart is used to connect with patients for Virtual Visits (Telemedicine).  Patients are able to view lab/test results, encounter notes, upcoming appointments, etc.  Non-urgent messages can be sent to your provider as well.   To learn more about what you can do with MyChart, go to [email or web address].    Your next appointment:   3 month(s)  The format for your next appointment:   In Person  Provider:   Jenkins Rouge, MD   Other Instructions  You have been referred to EP with Dr. Curt Bears to discuss possible ablation.   Cardiac Ablation Cardiac ablation is a procedure to destroy (ablate) some heart tissue that is sending bad signals. These bad  signals cause problems in heart rhythm. The heart has many areas that make these signals. If there are problems in these areas, they can make the heart beat in a way that is not normal. Destroying some tissues can help make the heart rhythm normal. Tell your doctor about: Any allergies you have. All medicines you are taking. These include vitamins, herbs, eye drops, creams, and over-the-counter medicines. Any problems you or family members have had with medicines that make you fall asleep (anesthetics). Any blood disorders you have. Any surgeries you have had. Any medical conditions you have, such as kidney failure. Whether you are pregnant or may be pregnant. What are the risks? This is a safe procedure. But problems may occur, including: Infection. Bruising and bleeding. Bleeding into the chest. Stroke or blood clots. Damage to nearby areas of your body. Allergies to medicines or dyes. The need for a pacemaker if the normal system is damaged. Failure of the procedure to treat the problem. What happens before the procedure? Medicines Ask your doctor about: Changing or stopping your normal medicines. This is important. Taking aspirin and ibuprofen. Do not take these medicines unless your doctor tells you to take them. Taking other medicines, vitamins, herbs, and supplements. General instructions Follow instructions from your doctor about what you cannot eat or drink. Plan to have someone take you home from the hospital or clinic. If you will be going home right after the procedure, plan to have someone with you  for 24 hours. Ask your doctor what steps will be taken to prevent infection. What happens during the procedure?  An IV tube will be put into one of your veins. You will be given a medicine to help you relax. The skin on your neck or groin will be numbed. A cut (incision) will be made in your neck or groin. A needle will be put through your cut and into a large vein. A tube  (catheter) will be put into the needle. The tube will be moved to your heart. Dye may be put through the tube. This helps your doctor see your heart. Small devices (electrodes) on the tube will send out signals. A type of energy will be used to destroy some heart tissue. The tube will be taken out. Pressure will be held on your cut. This helps stop bleeding. A bandage will be put over your cut. The exact procedure may vary among doctors and hospitals. What happens after the procedure? You will be watched until you leave the hospital or clinic. This includes checking your heart rate, breathing rate, oxygen, and blood pressure. Your cut will be watched for bleeding. You will need to lie still for a few hours. Do not drive for 24 hours or as long as your doctor tells you. Summary Cardiac ablation is a procedure to destroy some heart tissue. This is done to treat heart rhythm problems. Tell your doctor about any medical conditions you may have. Tell him or her about all medicines you are taking to treat them. This is a safe procedure. But problems may occur. These include infection, bruising, bleeding, and damage to nearby areas of your body. Follow what your doctor tells you about food and drink. You may also be told to change or stop some of your medicines. After the procedure, do not drive for 24 hours or as long as your doctor tells you. This information is not intended to replace advice given to you by your health care provider. Make sure you discuss any questions you have with your health care provider. Document Revised: 05/17/2019 Document Reviewed: 05/17/2019 Elsevier Patient Education  2022 [employer].

## 2021-04-29 NOTE — Assessment & Plan Note (Signed)
He is not interested in taking a statin.  He has changed his diet drastically and is encouraged by his CAC score decreasing over time.  I did explain that his overall risk will be reduced by getting his LDL less than 70 with some type of medical therapy.  I offered him ezetimibe (Zetia).  I also offered referring him to the lipid clinic for PCSK9 inhibitor therapy.  He will think about this.

## 2021-04-30 LAB — LIPOPROTEIN A (LPA): Lipoprotein (a): 16.8 nmol/L (ref <75.0)

## 2021-04-30 LAB — NMR, LIPOPROFILE
Cholesterol, Total: 222 mg/dL — ABNORMAL HIGH (ref 100–199)
HDL Particle Number: 29.9 umol/L — ABNORMAL LOW (ref >=30.5)
HDL-C: 71 mg/dL (ref >39)
LDL Particle Number: 1240 nmol/L — ABNORMAL HIGH (ref <1000)
LDL Size: 21.5 nm (ref >20.5)
LDL-C (NIH Calc): 143 mg/dL — ABNORMAL HIGH (ref 0–99)
LP-IR Score: <25 (ref <=45)
Small LDL Particle Number: 219 nmol/L (ref <=527)
Triglycerides: 46 mg/dL (ref 0–149)

## 2021-04-30 LAB — APOLIPOPROTEIN B: Apolipoprotein B: 109 mg/dL — ABNORMAL HIGH (ref <90)

## 2021-05-06 ENCOUNTER — Telehealth: Payer: Self-pay | Admitting: Cardiovascular Disease

## 2021-05-06 MED ORDER — APIXABAN 5 MG PO TABS: 5.0000 mg | ORAL_TABLET | Freq: Two times a day (BID) | ORAL | 1 refills | Status: DC

## 2021-05-06 NOTE — Telephone Encounter (Signed)
Pt last saw Tereso Newcomer, [place] 04/29/21, last labs 04/07/21 Creat 0.89, age 65, weight 78kg, based on specified criteria pt is on appropriate dosage of Eliquis 5mg  BID for afib.  Will refill rx.

## 2021-05-06 NOTE — Telephone Encounter (Signed)
*  STAT* If patient is at the pharmacy, call can be transferred to refill team.   1. Which medications need to be refilled? (please list name of each medication and dose if known) apixaban (ELIQUIS) 5 MG TABS tablet  2. Which pharmacy/location (including street and city if local pharmacy) is medication to be sent to? [id number] - [place], [place] - [address]  3. Do they need a 30 day or 90 day supply? 90  

## 2021-05-08 ENCOUNTER — Telehealth: Admitting: Physician Assistant

## 2021-05-08 DIAGNOSIS — M10072 Idiopathic gout, left ankle and foot: Secondary | ICD-10-CM | POA: Diagnosis not present

## 2021-05-08 MED ORDER — COLCHICINE 0.6 MG PO TABS: ORAL_TABLET | ORAL | 0 refills | Status: DC

## 2021-05-08 MED ORDER — PREDNISONE 20 MG PO TABS: 40.0000 mg | ORAL_TABLET | Freq: Every day | ORAL | 0 refills | Status: DC

## 2021-05-08 NOTE — Patient Instructions (Signed)
Levi Davis, thank you for joining Margaretann Loveless, PA-C for today's virtual visit.  While this provider is not your primary care provider (PCP), if your PCP is located in our provider database this encounter information will be shared with them immediately following your visit.  Consent: (Patient) Levi Davis provided verbal consent for this virtual visit at the beginning of the encounter.  Current Medications:  Current Outpatient Medications:    colchicine 0.6 MG tablet, Start with 2 tabs (1.2 mg) once then 1 tab every 12 hours after, Disp: 60 tablet, Rfl: 0   predniSONE (DELTASONE) 20 MG tablet, Take 2 tablets (40 mg total) by mouth daily with breakfast., Disp: 14 tablet, Rfl: 0   apixaban (ELIQUIS) 5 MG TABS tablet, Take 1 tablet (5 mg total) by mouth 2 (two) times daily., Disp: 180 tablet, Rfl: 1   Cholecalciferol (VITAMIN D3) 125 MCG (5000 UT) CAPS, Take 5,000 Units by mouth daily., Disp: , Rfl:    co-enzyme Q-10 30 MG capsule, Take 30 mg by mouth daily., Disp: , Rfl:    Cyanocobalamin (VITAMIN B-12 PO), Take 2,500 mcg by mouth daily., Disp: , Rfl:    dronedarone (MULTAQ) 400 MG tablet, Take 1 tablet (400 mg total) by mouth 2 (two) times daily with a meal., Disp: 180 tablet, Rfl: 3   Krill Oil 1000 MG CAPS, Take 2 capsules by mouth 2 (two) times daily., Disp: , Rfl:    medium chain triglycerides (MCT OIL) oil, Take 15 mLs by mouth daily., Disp: , Rfl:    metoprolol tartrate (LOPRESSOR) 25 MG tablet, Take 1 tablet (25 mg total) by mouth as needed. Increase heart rate and palpitations., Disp: 20 tablet, Rfl: 2   Omega-3 Fatty Acids (FISH OIL) 1000 MG CAPS, Take 2,000 mg by mouth daily., Disp: , Rfl:    Probiotic Product (PROBIOTIC-10) CAPS, Take 2-3 capsules by mouth daily., Disp: , Rfl:    Medications ordered in this encounter:  Meds ordered this encounter  Medications   predniSONE (DELTASONE) 20 MG tablet    Sig: Take 2 tablets (40 mg total) by mouth daily with breakfast.     Dispense:  14 tablet    Refill:  0    Order Specific Question:   Supervising Provider    Answer:   MILLER, BRIAN [[id number]]   colchicine 0.6 MG tablet    Sig: Start with 2 tabs (1.2 mg) once then 1 tab every 12 hours after    Dispense:  60 tablet    Refill:  0    Order Specific Question:   Supervising Provider    Answer:   Eber Hong [[id number]]     *If you need refills on other medications prior to your next appointment, please contact your pharmacy*  Follow-Up: Call back or seek an in-person evaluation if the symptoms worsen or if the condition fails to improve as anticipated.  Other Instructions Gout Gout is a condition that causes painful swelling of the joints. Gout is a type of inflammation of the joints (arthritis). This condition is caused by having too much uric acid in the body. Uric acid is a chemical that forms when the body breaks down substances called purines. Purines are important for building body proteins. When the body has too much uric acid, sharp crystals can form and build up inside the joints. This causes pain and swelling. Gout attacks can happen quickly and may be very painful (acute gout). Over time, the attacks can affect more joints and become more  frequent (chronic gout). Gout can also cause uric acid to build up under the skin and inside the kidneys. What are the causes? This condition is caused by too much uric acid in your blood. This can happen because: Your kidneys do not remove enough uric acid from your blood. This is the most common cause. Your body makes too much uric acid. This can happen with some cancers and cancer treatments. It can also occur if your body is breaking down too many red blood cells (hemolytic anemia). You eat too many foods that are high in purines. These foods include organ meats and some seafood. Alcohol, especially beer, is also high in purines. A gout attack may be triggered by trauma or stress. What increases the risk? You are more  likely to develop this condition if you: Have a family history of gout. Are male and middle-aged. Are male and have gone through menopause. Are obese. Frequently drink alcohol, especially beer. Are dehydrated. Lose weight too quickly. Have an organ transplant. Have lead poisoning. Take certain medicines, including aspirin, cyclosporine, diuretics, levodopa, and niacin. Have kidney disease. Have a skin condition called psoriasis. What are the signs or symptoms? An attack of acute gout happens quickly. It usually occurs in just one joint. The most common place is the big toe. Attacks often start at night. Other joints that may be affected include joints of the feet, ankle, knee, fingers, wrist, or elbow. Symptoms of this condition may include: Severe pain. Warmth. Swelling. Stiffness. Tenderness. The affected joint may be very painful to touch. Shiny, red, or purple skin. Chills and fever. Chronic gout may cause symptoms more frequently. More joints may be involved. You may also have white or yellow lumps (tophi) on your hands or feet or in other areas near your joints. How is this diagnosed? This condition is diagnosed based on your symptoms, medical history, and physical exam. You may have tests, such as: Blood tests to measure uric acid levels. Removal of joint fluid with a thin needle (aspiration) to look for uric acid crystals. X-rays to look for joint damage. How is this treated? Treatment for this condition has two phases: treating an acute attack and preventing future attacks. Acute gout treatment may include medicines to reduce pain and swelling, including: NSAIDs. Steroids. These are strong anti-inflammatory medicines that can be taken by mouth (orally) or injected into a joint. Colchicine. This medicine relieves pain and swelling when it is taken soon after an attack. It can be given by mouth or through an IV. Preventive treatment may include: Daily use of smaller doses  of NSAIDs or colchicine. Use of a medicine that reduces uric acid levels in your blood. Changes to your diet. You may need to see a dietitian about what to eat and drink to prevent gout. Follow these instructions at home: During a gout attack  If directed, put ice on the affected area: Put ice in a plastic bag. Place a towel between your skin and the bag. Leave the ice on for 20 minutes, 2-3 times a day. Raise (elevate) the affected joint above the level of your heart as often as possible. Rest the joint as much as possible. If the affected joint is in your leg, you may be given crutches to use. Follow instructions from your health care provider about eating or drinking restrictions. Avoiding future gout attacks Follow a low-purine diet as told by your dietitian or health care provider. Avoid foods and drinks that are high in purines, including  liver, kidney, anchovies, asparagus, herring, mushrooms, mussels, and beer. Maintain a healthy weight or lose weight if you are overweight. If you want to lose weight, talk with your health care provider. It is important that you do not lose weight too quickly. Start or maintain an exercise program as told by your health care provider. Eating and drinking Drink enough fluids to keep your urine pale yellow. If you drink alcohol: Limit how much you use to: 0-1 drink a day for women. 0-2 drinks a day for men. Be aware of how much alcohol is in your drink. In the [place] one drink equals one 12 oz bottle of beer (355 mL) one 5 oz glass of wine (148 mL), or one 1 oz glass of hard liquor (44 mL). General instructions Take over-the-counter and prescription medicines only as told by your health care provider. Do not drive or use heavy machinery while taking prescription pain medicine. Return to your normal activities as told by your health care provider. Ask your health care provider what activities are safe for you. Keep all follow-up visits as told by  your health care provider. This is important. Contact a health care provider if you have: Another gout attack. Continuing symptoms of a gout attack after 10 days of treatment. Side effects from your medicines. Chills or a fever. Burning pain when you urinate. Pain in your lower back or belly. Get help right away if you: Have severe or uncontrolled pain. Cannot urinate. Summary Gout is painful swelling of the joints caused by inflammation. The most common site of pain is the big toe, but it can affect other joints in the body. Medicines and dietary changes can help to prevent and treat gout attacks. This information is not intended to replace advice given to you by your health care provider. Make sure you discuss any questions you have with your health care provider. Document Revised: 12/23/2017 Document Reviewed: 01/04/2018 Elsevier Patient Education  2022 [employer].    If you have been instructed to have an in-person evaluation today at a local Urgent Care facility, please use the link below. It will take you to a list of all of our available [facility], including address, phone number and hours of operation. Please do not delay care.  [facility]  If you or a family member do not have a primary care provider, use the link below to schedule a visit and establish care. When you choose a [facility] primary care physician or advanced practice provider, you gain a long-term partner in health. Find a Primary Care Provider  Learn more about [facility] in-office and virtual care options:  - Get Care Now

## 2021-05-08 NOTE — Progress Notes (Signed)
Virtual Visit Consent   Levi Davis, you are scheduled for a virtual visit with a [facility] provider today.     Just as with appointments in the office, your consent must be obtained to participate.  Your consent will be active for this visit and any virtual visit you may have with one of our providers in the next 365 days.     If you have a MyChart account, a copy of this consent can be sent to you electronically.  All virtual visits are billed to your insurance company just like a traditional visit in the office.    As this is a virtual visit, video technology does not allow for your provider to perform a traditional examination.  This may limit your provider's ability to fully assess your condition.  If your provider identifies any concerns that need to be evaluated in person or the need to arrange testing (such as labs, EKG, etc.), we will make arrangements to do so.     Although advances in technology are sophisticated, we cannot ensure that it will always work on either your end or our end.  If the connection with a video visit is poor, the visit may have to be switched to a telephone visit.  With either a video or telephone visit, we are not always able to ensure that we have a secure connection.     I need to obtain your verbal consent now.   Are you willing to proceed with your visit today?    Nickolos Loughran has provided verbal consent on 05/08/2021 for a virtual visit (video or telephone).   Mar Daring, PA-C   Date: 05/08/2021 11:05 AM   Virtual Visit via Video Note   I, Mar Daring, connected with  Levi Davis  ([id number], Aug 24, 1955) on 05/08/21 at 11:00 AM EST by a video-enabled telemedicine application and verified that I am speaking with the correct person using two identifiers.  Location: Patient: Virtual Visit Location Patient: Home Provider: Virtual Visit Location Provider: Home Office   I discussed the limitations of evaluation and management by  telemedicine and the availability of in person appointments. The patient expressed understanding and agreed to proceed.    History of Present Illness: Levi Davis is a 65 y.o. who identifies as a male who was assigned male at birth, and is being seen today for gout flare. Started last weekend. Has had gout since college. Has not had a gout flare since making lifestyle changes a few years ago. Current flare is in the left ankle. Area is red, warm to touch, swollen, sensitive to touch. Has been pushing fluids and tried tylenol without relief. Denies fevers, chills, nausea, vomiting.   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   Atrial fibrillation with RVR ([place]) 06/23/2018   Hypercholesterolemia 06/23/2018   Closed right ankle fracture 06/23/2018    Allergies: No Known Allergies Medications:  Current Outpatient Medications:    colchicine 0.6 MG tablet, Start with 2 tabs (1.2 mg) once then 1 tab every 12 hours after, Disp: 60 tablet, Rfl: 0   predniSONE (DELTASONE) 20 MG tablet, Take 2 tablets (40 mg total) by mouth daily with breakfast., Disp: 14 tablet, Rfl:  0   apixaban (ELIQUIS) 5 MG TABS tablet, Take 1 tablet (5 mg total) by mouth 2 (two) times daily., Disp: 180 tablet, Rfl: 1   Cholecalciferol (VITAMIN D3) 125 MCG (5000 UT) CAPS, Take 5,000 Units by mouth daily., Disp: , Rfl:    co-enzyme Q-10 30 MG capsule, Take 30 mg by mouth daily., Disp: , Rfl:    Cyanocobalamin (VITAMIN B-12 PO), Take 2,500 mcg by mouth daily., Disp: , Rfl:    dronedarone (MULTAQ) 400 MG tablet, Take 1 tablet (400 mg total) by mouth 2 (two) times daily with a meal., Disp: 180 tablet, Rfl: 3   Krill Oil  1000 MG CAPS, Take 2 capsules by mouth 2 (two) times daily., Disp: , Rfl:    medium chain triglycerides (MCT OIL) oil, Take 15 mLs by mouth daily., Disp: , Rfl:    metoprolol tartrate (LOPRESSOR) 25 MG tablet, Take 1 tablet (25 mg total) by mouth as needed. Increase heart rate and palpitations., Disp: 20 tablet, Rfl: 2   Omega-3 Fatty Acids (FISH OIL) 1000 MG CAPS, Take 2,000 mg by mouth daily., Disp: , Rfl:    Probiotic Product (PROBIOTIC-10) CAPS, Take 2-3 capsules by mouth daily., Disp: , Rfl:   Observations/Objective:Patient is well-developed, well-nourished in no acute distress.  Resting comfortably at home.  Head is normocephalic, atraumatic.  No labored breathing.  Speech is clear and coherent with logical content.  Patient is alert and oriented at baseline.    Assessment and Plan: 1. Acute idiopathic gout of left ankle - predniSONE (DELTASONE) 20 MG tablet; Take 2 tablets (40 mg total) by mouth daily with breakfast.  Dispense: 14 tablet; Refill: 0 - colchicine 0.6 MG tablet; Start with 2 tabs (1.2 mg) once then 1 tab every 12 hours after  Dispense: 60 tablet; Refill: 0  - Will treat with prednisone and colchicine as below - Push fluids - Ice as needed - Rest - Seek in person evaluation if worsening or not improving with treatment  Follow Up Instructions: I discussed the assessment and treatment plan with the patient. The patient was provided an opportunity to ask questions and all were answered. The patient agreed with the plan and demonstrated an understanding of the instructions.  A copy of instructions were sent to the patient via MyChart unless otherwise noted below.    The patient was advised to call back or seek an in-person evaluation if the symptoms worsen or if the condition fails to improve as anticipated.  Time:  I spent 12 minutes with the patient via telehealth technology discussing the above problems/concerns.    Margaretann Loveless, PA-C

## 2021-05-11 ENCOUNTER — Other Ambulatory Visit: Payer: Self-pay

## 2021-05-11 ENCOUNTER — Ambulatory Visit (INDEPENDENT_AMBULATORY_CARE_PROVIDER_SITE_OTHER)
Admission: RE | Admit: 2021-05-11 | Discharge: 2021-05-11 | Disposition: A | Discharge status: Home / Self Care | Payer: Self-pay | Source: Ambulatory Visit | Attending: Cardiovascular Disease | Admitting: Cardiovascular Disease

## 2021-05-11 ENCOUNTER — Other Ambulatory Visit

## 2021-05-11 DIAGNOSIS — E782 Mixed hyperlipidemia: Secondary | ICD-10-CM

## 2021-05-11 DIAGNOSIS — I480 Paroxysmal atrial fibrillation: Secondary | ICD-10-CM

## 2021-05-11 DIAGNOSIS — E7800 Pure hypercholesterolemia, unspecified: Secondary | ICD-10-CM

## 2021-05-11 DIAGNOSIS — I2510 Atherosclerotic heart disease of native coronary artery without angina pectoris: Secondary | ICD-10-CM

## 2021-05-25 ENCOUNTER — Other Ambulatory Visit: Payer: Self-pay | Admitting: *Deleted

## 2021-05-25 DIAGNOSIS — I4891 Unspecified atrial fibrillation: Secondary | ICD-10-CM

## 2021-05-25 MED ORDER — APIXABAN 5 MG PO TABS: 5.0000 mg | ORAL_TABLET | Freq: Two times a day (BID) | ORAL | 1 refills | Status: DC

## 2021-05-25 NOTE — Telephone Encounter (Signed)
Eliquis 5mg  paper refill request received. Patient is 65 years old, weight-78kg, Crea-0.89 on 04/07/2021, Diagnosis-Afib, and last seen by 06/07/2021 on 04/29/2021. Dose is appropriate based on dosing criteria. Will send in refill to requested pharmacy.    Last refill was sent to local pharmacy this is a new prescription request for [facility] mail order.

## 2021-06-01 ENCOUNTER — Other Ambulatory Visit: Payer: Self-pay

## 2021-06-01 MED ORDER — METOPROLOL TARTRATE 25 MG PO TABS
25.0000 mg | ORAL_TABLET | ORAL | 3 refills | Status: DC | PRN
Start: 2021-06-01 — End: 2021-07-10

## 2021-06-01 MED ORDER — MULTAQ 400 MG PO TABS: 400.0000 mg | ORAL_TABLET | Freq: Two times a day (BID) | ORAL | 3 refills | Status: DC

## 2021-06-02 ENCOUNTER — Encounter: Payer: Self-pay | Admitting: Cardiology

## 2021-06-02 ENCOUNTER — Other Ambulatory Visit: Payer: Self-pay

## 2021-06-02 ENCOUNTER — Ambulatory Visit (INDEPENDENT_AMBULATORY_CARE_PROVIDER_SITE_OTHER): Admitting: Cardiology

## 2021-06-02 VITALS — BP 100/58 | HR 113 | Ht 72.0 in | Wt 173.6 lb

## 2021-06-02 DIAGNOSIS — I4819 Other persistent atrial fibrillation: Secondary | ICD-10-CM

## 2021-06-02 NOTE — Patient Instructions (Signed)
Medication Instructions:  Your physician recommends that you continue on your current medications as directed. Please refer to the Current Medication list given to you today.  *If you need a refill on your cardiac medications before your next appointment, please call your pharmacy*   Lab Work: None ordered If you have labs (blood work) drawn today and your tests are completely normal, you will receive your results only by: [place] (if you have MyChart) OR A paper copy in the mail If you have any lab test that is abnormal or we need to change your treatment, we will call you to review the results.   Testing/Procedures: Please call the office when you decide if you would like to go for an ablation vs. Tikosyn   Follow-Up: At [facility], you and your health needs are our priority.  As part of our continuing mission to provide you with exceptional heart care, we have created designated Provider Care Teams.  These Care Teams include your primary Cardiologist (physician) and Advanced Practice Providers (APPs -  Physician Assistants and Nurse Practitioners) who all work together to provide you with the care you need, when you need it.   Your next appointment:     to be determined  The format for your next appointment:   In Person  Provider:   Allegra Lai, MD    Thank you for choosing [facility]!!   Trinidad Curet, RN [phone]   Other Instructions   Dofetilide capsules What is this medication? DOFETILIDE (doe FET il ide) is an antiarrhythmic drug. It helps make your heart beat regularly. This medicine also helps to slow rapid heartbeats. This medicine may be used for other purposes; ask your health care provider or pharmacist if you have questions. COMMON BRAND NAME(S): Tikosyn What should I tell my care team before I take this medication? They need to know if you have any of these conditions: heart disease history of irregular heartbeat history of low  levels of potassium or magnesium in the blood kidney disease liver disease an unusual or allergic reaction to dofetilide, other medicines, foods, dyes, or preservatives pregnant or trying to get pregnant breast-feeding How should I use this medication? Take this medicine by mouth with a glass of water. Follow the directions on the prescription label. Do not take with grapefruit juice. You can take it with or without food. If it upsets your stomach, take it with food. Take your medicine at regular intervals. Do not take it more often than directed. Do not stop taking except on your doctor's advice. A special MedGuide will be given to you by the pharmacist with each prescription and refill. Be sure to read this information carefully each time. Talk to your pediatrician regarding the use of this medicine in children. Special care may be needed. Overdosage: If you think you have taken too much of this medicine contact a poison control center or emergency room at once. NOTE: This medicine is only for you. Do not share this medicine with others. What if I miss a dose? If you miss a dose, skip it. Take your next dose at the normal time. Do not take extra or 2 doses at the same time to make up for the missed dose. What may interact with this medication? Do not take this medicine with any of the following medications: cimetidine cisapride dolutegravir dronedarone erdafitinib hydrochlorothiazide ketoconazole megestrol pimozide prochlorperazine thioridazine trimethoprim verapamil This medicine may also interact with the following medications: amiloride cannabinoids certain antibiotics  like erythromycin or clarithromycin certain antiviral medicines for HIV or hepatitis certain medicines for depression, anxiety, or psychotic disorders digoxin diltiazem grapefruit juice metformin nefazodone other medicines that prolong the QT interval (an abnormal heart  rhythm) quinine triamterene zafirlukast ziprasidone This list may not describe all possible interactions. Give your health care provider a list of all the medicines, herbs, non-prescription drugs, or dietary supplements you use. Also tell them if you smoke, drink alcohol, or use illegal drugs. Some items may interact with your medicine. What should I watch for while using this medication? Your condition will be monitored carefully while you are receiving this medicine. What side effects may I notice from receiving this medication? Side effects that you should report to your doctor or health care professional as soon as possible: allergic reactions like skin rash, itching or hives, swelling of the face, lips, or tongue breathing problems chest pain or chest tightness dizziness signs and symptoms of a dangerous change in heartbeat or heart rhythm like chest pain; dizziness; fast or irregular heartbeat; palpitations; feeling faint or lightheaded, falls; breathing problems signs and symptoms of electrolyte imbalance like severe diarrhea, unusual sweating, vomiting, loss of appetite, increased thirst swelling of the ankles, legs, or feet tingling, numbness in the hands or feet Side effects that usually do not require medical attention (report to your doctor or health care professional if they continue or are bothersome): diarrhea general ill feeling or flu-like symptoms headache nausea trouble sleeping stomach pain This list may not describe all possible side effects. Call your doctor for medical advice about side effects. You may report side effects to FDA at 1-800-FDA-1088. Where should I keep my medication? Keep out of the reach of children. Store at room temperature between 15 and 30 degrees C (59 and 86 degrees F). Throw away any unused medicine after the expiration date. NOTE: This sheet is a summary. It may not cover all possible information. If you have questions about this medicine,  talk to your doctor, pharmacist, or health care provider.  2022 Elsevier/Gold Standard (2021-03-03 00:00:00      Tikosyn (Dofetilide) Hospital Admission  Prior to day of admission: Check with drug insurance company for cost of drug to ensure affordability --- Dofetilide 500 mcg twice a day.  GoodRx is an option if insurance copay is unaffordable.  All patients are tested for COVID-19 prior to admission.  No Benadryl is allowed 3 days prior to admission.  Please ensure no missed doses of your anticoagulation (blood thinner) for 3 weeks prior to admission. If a dose is missed please notify our office immediately.  A pharmacist will review all your medications for potential interactions with Tikosyn. If any medication changes are needed prior to admission we will be in touch with you.  If any new medications are started AFTER your admission date is set with Nurse, adult. Please notify our office immediately so your medication list can be updated and reviewed by our pharmacist again. On day of admission: Tikosyn initiation requires a 3 night/4 day hospital stay with constant telemetry monitoring. You will have an EKG after each dose of Tikosyn as well as daily lab draws.  If the drug does not convert you to normal rhythm a cardioversion after the 4th dose of Tikosyn.  [facility] office visit on the morning of admission is needed for preliminary labs/ekg.  Time of admission is dependent on bed availability in the hospital. In some instances, you will be sent home until bed is available. Rarely admission  can be delayed to the following day if hospital census prevents available beds.  You may bring personal belongings/clothing with you to the hospital. Please leave your suitcase in the car until you arrive in admissions.  Questions please call our office at [phone]     Cardiac Ablation Cardiac ablation is a procedure to destroy (ablate) some heart tissue that is sending bad signals.  These bad signals cause problems in heart rhythm. The heart has many areas that make these signals. If there are problems in these areas, they can make the heart beat in a way that is not normal. Destroying some tissues can help make the heart rhythm normal. Tell your doctor about: Any allergies you have. All medicines you are taking. These include vitamins, herbs, eye drops, creams, and over-the-counter medicines. Any problems you or family members have had with medicines that make you fall asleep (anesthetics). Any blood disorders you have. Any surgeries you have had. Any medical conditions you have, such as kidney failure. Whether you are pregnant or may be pregnant. What are the risks? This is a safe procedure. But problems may occur, including: Infection. Bruising and bleeding. Bleeding into the chest. Stroke or blood clots. Damage to nearby areas of your body. Allergies to medicines or dyes. The need for a pacemaker if the normal system is damaged. Failure of the procedure to treat the problem. What happens before the procedure? Medicines Ask your doctor about: Changing or stopping your normal medicines. This is important. Taking aspirin and ibuprofen. Do not take these medicines unless your doctor tells you to take them. Taking other medicines, vitamins, herbs, and supplements. General instructions Follow instructions from your doctor about what you cannot eat or drink. Plan to have someone take you home from the hospital or clinic. If you will be going home right after the procedure, plan to have someone with you for 24 hours. Ask your doctor what steps will be taken to prevent infection. What happens during the procedure?  An IV tube will be put into one of your veins. You will be given a medicine to help you relax. The skin on your neck or groin will be numbed. A cut (incision) will be made in your neck or groin. A needle will be put through your cut and into a large  vein. A tube (catheter) will be put into the needle. The tube will be moved to your heart. Dye may be put through the tube. This helps your doctor see your heart. Small devices (electrodes) on the tube will send out signals. A type of energy will be used to destroy some heart tissue. The tube will be taken out. Pressure will be held on your cut. This helps stop bleeding. A bandage will be put over your cut. The exact procedure may vary among doctors and hospitals. What happens after the procedure? You will be watched until you leave the hospital or clinic. This includes checking your heart rate, breathing rate, oxygen, and blood pressure. Your cut will be watched for bleeding. You will need to lie still for a few hours. Do not drive for 24 hours or as long as your doctor tells you. Summary Cardiac ablation is a procedure to destroy some heart tissue. This is done to treat heart rhythm problems. Tell your doctor about any medical conditions you may have. Tell him or her about all medicines you are taking to treat them. This is a safe procedure. But problems may occur. These include infection, bruising, bleeding,  and damage to nearby areas of your body. Follow what your doctor tells you about food and drink. You may also be told to change or stop some of your medicines. After the procedure, do not drive for 24 hours or as long as your doctor tells you. This information is not intended to replace advice given to you by your health care provider. Make sure you discuss any questions you have with your health care provider. Document Revised: 05/17/2019 Document Reviewed: 05/17/2019 Elsevier Patient Education  2022 [employer].

## 2021-06-02 NOTE — Progress Notes (Signed)
Electrophysiology Office Note   Date:  06/02/2021   ID:  Levi Davis, DOB Jun 26, 1956, MRN [id number]  PCP:  Wendie Agreste, MD  Cardiologist:  Bunnie Pion Primary Electrophysiologist:  Constance Haw, MD    Chief Complaint: AF   History of Present Illness: Levi Davis is a 65 y.o. male who is being seen today for the evaluation of AF at the request of Liliane Shi, PA-C. Presenting today for electrophysiology evaluation.  He has a history of atrial fibrillation, hyperlipidemia, posterior circulation TIA.  He had his TIA 04/07/2021.  On presentation to the emergency room, he was found to be in rapid atrial fibrillation.  CT demonstrated chronic L ICA occlusion with tiny silent left-sided watershed infarcts on MRI.  He rides his Peloton for 45 minutes without difficulty.  He knows when he is in atrial fibrillation.  When he is in atrial fibrillation, it self resolves.  Today, he denies symptoms of palpitations, chest pain, shortness of breath, orthopnea, PND, lower extremity edema, claudication, dizziness, presyncope, syncope, bleeding, or neurologic sequela. The patient is tolerating medications without difficulties.  He is aware when he is in atrial fibrillation.  His symptoms are fatigue and weakness.  Despite that, he overall feels fairly well.  He does exercise quite vigorously, and can tell when he is can have a good day or a bad day based on when he is in sinus rhythm.  He would like to stay in normal rhythm.   Past Medical History:  Diagnosis Date   A-fib ([place]) 06/23/2018   Arrhythmia 2013   AFIB   Closed right ankle fracture    Hypercholesterolemia    Past Surgical History:  Procedure Laterality Date   broke leg     screws and pins     Current Outpatient Medications  Medication Sig Dispense Refill   apixaban (ELIQUIS) 5 MG TABS tablet Take 1 tablet (5 mg total) by mouth 2 (two) times daily. 180 tablet 1   Cholecalciferol (VITAMIN D3) 125 MCG (5000 UT)  CAPS Take 5,000 Units by mouth daily.     co-enzyme Q-10 30 MG capsule Take 30 mg by mouth daily.     colchicine 0.6 MG tablet Start with 2 tabs (1.2 mg) once then 1 tab every 12 hours after 60 tablet 0   Cyanocobalamin (VITAMIN B-12 PO) Take 2,500 mcg by mouth daily.     dronedarone (MULTAQ) 400 MG tablet Take 1 tablet (400 mg total) by mouth 2 (two) times daily with a meal. 180 tablet 3   Krill Oil 1000 MG CAPS Take 2 capsules by mouth 2 (two) times daily.     medium chain triglycerides (MCT OIL) oil Take 15 mLs by mouth daily.     metoprolol tartrate (LOPRESSOR) 25 MG tablet Take 1 tablet (25 mg total) by mouth as needed. Increase heart rate and palpitations. 60 tablet 3   Omega-3 Fatty Acids (FISH OIL) 1000 MG CAPS Take 2,000 mg by mouth daily.     predniSONE (DELTASONE) 20 MG tablet Take 2 tablets (40 mg total) by mouth daily with breakfast. 14 tablet 0   Probiotic Product (PROBIOTIC-10) CAPS Take 2-3 capsules by mouth daily.     No current facility-administered medications for this visit.    Allergies:   Patient has no known allergies.   Social History:  The patient  reports that he has never smoked. He has never used smokeless tobacco. He reports current alcohol use. He reports that he does not use  drugs.   Family History:  The patient's family history includes Aneurysm in his sister; Anuerysm in his mother; Heart Problems in his father; High blood pressure in his father, mother, and sister; Obesity in his sister; Stroke in his mother.    ROS:  Please see the history of present illness.   Otherwise, review of systems is positive for none.   All other systems are reviewed and negative.    PHYSICAL EXAM: VS:  BP (!) 100/58   Pulse (!) 113   Ht 6' (1.829 m)   Wt 173 lb 9.6 oz (78.7 kg)   SpO2 97%   BMI 23.54 kg/m  , BMI Body mass index is 23.54 kg/m. GEN: Well nourished, well developed, in no acute distress  HEENT: normal  Neck: no JVD, carotid bruits, or masses Cardiac:  Irregular, tachycardic; no murmurs, rubs, or gallops,no edema  Respiratory:  clear to auscultation bilaterally, normal work of breathing GI: soft, nontender, nondistended, + BS MS: no deformity or atrophy  Skin: warm and dry Neuro:  Strength and sensation are intact Psych: euthymic mood, full affect  EKG:  EKG is ordered today. Personal review of the ekg ordered shows atrial fibrillation, rate 113  Recent Labs: 04/07/2021: ALT 17; BUN 19; Creatinine, Ser 0.89; Magnesium 2.1; Potassium 4.1; Sodium 139 04/08/2021: Hemoglobin 14.3; Platelets 183    Lipid Panel     Component Value Date/Time   CHOL 222 (H) 04/08/2021 0225   TRIG 24 04/08/2021 0225   HDL 76 04/08/2021 0225   CHOLHDL 2.9 04/08/2021 0225   VLDL 5 04/08/2021 0225   LDLCALC 141 (H) 04/08/2021 0225     Wt Readings from Last 3 Encounters:  06/02/21 173 lb 9.6 oz (78.7 kg)  04/29/21 172 lb (78 kg)  04/07/21 171 lb (77.6 kg)      Other studies Reviewed: Additional studies/ records that were reviewed today include: TTE 04/08/21  Review of the above records today demonstrates:   1. No bubble study performed.   2. Abnormal septal motion. Left ventricular ejection fraction, by  estimation, is 55 to 60%. The left ventricle has normal function. The left  ventricle has no regional wall motion abnormalities. Left ventricular  diastolic parameters were normal.   3. Right ventricular systolic function is normal. The right ventricular  size is normal.   4. The mitral valve is normal in structure. No evidence of mitral valve  regurgitation. No evidence of mitral stenosis.   5. The aortic valve is normal in structure. Aortic valve regurgitation is  not visualized. No aortic stenosis is present.   6. The inferior vena cava is normal in size with greater than 50%  respiratory variability, suggesting right atrial pressure of 3 mmHg.    ASSESSMENT AND PLAN:  1.  Paroxysmal atrial fibrillation: Currently on Multaq 400 mg twice  daily, Eliquis 5 mg twice daily, metoprolol 25 mg as needed.  CHA2DS2-VASc of 2.  High risk medication monitoring via ECG for Multaq.  He would like to stay in normal rhythm.  We discussed further options including ablation versus medical management with dofetilide.  He would like to further consider his options and Sophira Rumler call us back with an answer.  2.  Cryptogenic stroke: Currently on Eliquis for atrial fibrillation.  3.  Hyperlipidemia: We Kylil Swopes likely need lipid-lowering management.  Plan per primary cardiology.  Case discussed with primary cardiology  Current medicines are reviewed at length with the patient today.   The patient does not have concerns regarding  his medicines.  The following changes were made today:  none  Labs/ tests ordered today include:  Orders Placed This Encounter  Procedures   EKG 12-Lead     Disposition:   FU with Oswin Griffith 3 months  Signed, Gaylia Kassel Meredith Leeds, MD  06/02/2021 9:35 AM     [facility] [address] [place] [address] [phone] (office) [phone] (fax)

## 2021-07-06 ENCOUNTER — Inpatient Hospital Stay: Admitting: Neurology

## 2021-07-10 ENCOUNTER — Ambulatory Visit (INDEPENDENT_AMBULATORY_CARE_PROVIDER_SITE_OTHER): Payer: Medicare Other | Admitting: Family Medicine

## 2021-07-10 ENCOUNTER — Encounter: Payer: Self-pay | Admitting: Family Medicine

## 2021-07-10 VITALS — BP 122/78 | HR 108 | Temp 97.9°F | Resp 17 | Ht 72.0 in | Wt 177.4 lb

## 2021-07-10 DIAGNOSIS — Z8673 Personal history of transient ischemic attack (TIA), and cerebral infarction without residual deficits: Secondary | ICD-10-CM

## 2021-07-10 DIAGNOSIS — E7800 Pure hypercholesterolemia, unspecified: Secondary | ICD-10-CM

## 2021-07-10 DIAGNOSIS — I2510 Atherosclerotic heart disease of native coronary artery without angina pectoris: Secondary | ICD-10-CM | POA: Diagnosis not present

## 2021-07-10 DIAGNOSIS — I4891 Unspecified atrial fibrillation: Secondary | ICD-10-CM

## 2021-07-10 DIAGNOSIS — I6522 Occlusion and stenosis of left carotid artery: Secondary | ICD-10-CM | POA: Diagnosis not present

## 2021-07-10 DIAGNOSIS — M10072 Idiopathic gout, left ankle and foot: Secondary | ICD-10-CM

## 2021-07-10 MED ORDER — PREDNISONE 20 MG PO TABS: 40.0000 mg | ORAL_TABLET | Freq: Every day | ORAL | 0 refills | Status: DC

## 2021-07-10 NOTE — Progress Notes (Signed)
Subjective:  Patient ID: Levi Davis, male    DOB: Oct 06, 1955  Age: 66 y.o. MRN: [id number]  CC:  Chief Complaint  Patient presents with   New Patient (Initial Visit)    Pt here to establish with Dr Levi Davis, has been seen within [facility] previously, no concerns currently wanted noted he has occasional gout attacks but has been doing well     HPI Levi Davis presents for  New patient to establish care.  Prior PCP in [place].  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 Prior with [employer]. Now in new business with helicopter sales.  Retired Network engineer. Doctor, general practice.  Feels healthier than he has in past. 6 years ago - made some significant health changes, antiinflammatory lifestyle, lost 45-50#, daily workout. Fasting few days per month, eating window during the day.  Wife is Designer, jewellery.    Cardiac: Cardiologist Dr. Johnsie Davis, electrophysiologist Dr. Curt Davis. History of atrial fibrillation, coronary artery disease. Started on anticoagulation after April 07, 2021 hospitalization, TIA.  Paroxysmal A. fib with low CHA2DS2-VASc score previously Eliquis 5 mg twice daily.  Multaq 400 mg twice daily.  Appointment noted with Dr. Curt Davis on 06/02/2021.  Option of ablation versus medical management with dofetilide discussed. Still considering ablation - not ready yet.  Riding [employer] for 45 minutes without difficulty.  Senses A. fib that self resolves.  Fatigue/weakness as symptoms.   Coronary calcium score 05/11/2021, score 261, 74th percentile with involvement of the LAD, RCA. (Prior scores 190, 130 on prior CCS in past).  No statin - discussed prior.  Has had NMR testing in past - Dr. Johnsie Davis. Will not take statin.   Lab Results  Component Value Date   CHOL 222 (H) 04/08/2021   HDL 76 04/08/2021   LDLCALC 141 (H) 04/08/2021   TRIG 24 04/08/2021   CHOLHDL 2.9 04/08/2021    History of TIA,  Admitted in October 2022 with bilateral arm numbness and weakness  that resolved, diagnosed with TIA, unremarkable for acute ischemic infarct, MRA worrisome for basilar artery thrombus given lack of opacification.  Initiated Eliquis.  Recommended high-dose statin but declined.  Chronic left internal carotid artery occlusion.  Eval by vascular  - Dr. Glennon Davis at [facility] - 05/07/2021.  Left carotid occlusion either congenital or from a dissection.  Plan for surveillance plan of his right/patent carotid with likely annual duplex. No new bleeding with Eliquis.  Gout: Last flare: November 2022, video visit. Known cause with alcohol prior. Gout since college.  Usually improved with lifestyle changes.  Left ankle flare in November.  Treated with prednisone, colchicine. Daily meds: None.  Tried allopurinol in past, came off allopurinol after diet changes and did well. No gout flares in years.  International travel planned soon.  Prn med: Colchicine available.  No results found for: LABURIC - unknown last testing.   HM: Colonoscopy less than 5 yrs ago -repeat every 5 years. Due next year.  Declines pneumonia, covid, shingles, flu vaccine. Covid infecion April 2022. Mild disease.     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 Authorizing Provider  apixaban (ELIQUIS) 5 MG TABS tablet Take 1 tablet (5 mg total) by mouth 2 (two) times daily. 05/25/21   Josue Hector, MD  Cholecalciferol (VITAMIN D3) 125 MCG (5000 UT) CAPS Take 5,000 Units by mouth daily.    [provider]  co-enzyme Q-10 30 MG capsule Take 30 mg by mouth daily.    [provider]  colchicine 0.6 MG tablet Start with 2 tabs (1.2 mg) once then 1 tab every 12 hours after 05/08/21   Mar Daring, PA-C  Cyanocobalamin (VITAMIN B-12 PO) Take 2,500 mcg by mouth daily.    [provider]  dronedarone (MULTAQ) 400 MG tablet Take 1 tablet (400 mg total) by mouth 2 (two) times daily with a meal. 06/01/21 06/01/22  Josue Hector, MD  Krill Oil 1000 MG CAPS Take 2 capsules by mouth 2 (two) times daily.    [provider]  medium chain triglycerides (MCT OIL) oil Take 15 mLs by mouth daily.    [provider]  metoprolol tartrate (LOPRESSOR) 25 MG tablet Take 1 tablet (25 mg total) by mouth as needed. Increase heart rate and palpitations. 06/01/21 08/30/21  Josue Hector, MD  Omega-3 Fatty Acids (FISH OIL) 1000 MG CAPS Take 2,000 mg by mouth daily.    [provider]  predniSONE (DELTASONE) 20 MG tablet Take 2 tablets (40 mg total) by mouth daily with breakfast. 05/08/21   Mar Daring, PA-C  Probiotic Product (PROBIOTIC-10) CAPS Take 2-3 capsules by mouth daily.    [provider]   Social History   Socioeconomic History   Marital status: Married    Spouse name: Not on file   Number of children: Not on file   Years of education: Not on file   Highest education level: Not on file  Occupational History   Not on file  Tobacco Use   Smoking status: Never   Smokeless tobacco: Never  Vaping Use   Vaping Use: Never used  Substance and Sexual Activity   Alcohol use: Yes    Comment: occasional   Drug use: Never   Sexual activity: Yes     Birth control/protection: None  Other Topics Concern   Not on file  Social History Narrative   Not on file   Social Determinants of Health   Financial Resource Strain: Not on file  Food Insecurity: Not on file  Transportation Needs: Not on file  Physical Activity: Not on file  Stress: Not on file  Social Connections: Not on file  Intimate Partner Violence: Not on file    Review of Systems   Objective:   Vitals:   07/10/21 0922  BP: 122/78  Pulse: (!) 108  Resp: 17  Temp: 97.9 F (36.6 C)  TempSrc: Temporal  SpO2: 97%  Weight: 177 lb 6.4 oz (80.5 kg)  Height: 6' (1.829 m)     Physical Exam Vitals reviewed.  Constitutional:      Appearance: He is well-developed.  HENT:     Head: Normocephalic and  atraumatic.  Neck:     Vascular: No carotid bruit or JVD.  Cardiovascular:     Rate and Rhythm: Tachycardia present.     Heart sounds: Normal heart sounds. No murmur heard. Pulmonary:     Effort: Pulmonary effort is normal.     Breath sounds: Normal breath sounds. No rales.  Musculoskeletal:     Right lower leg: No edema.     Left lower leg: No edema.  Skin:    General: Skin is warm and dry.  Neurological:     Mental Status: He is alert and oriented to person, place, and time.  Psychiatric:        Mood and Affect: Mood normal.       Assessment & Plan:  Levi Davis is a 66 y.o. male . Stenosis of left carotid artery  -Congenital versus posttraumatic as above per discussion with vascular.  Plan for ongoing monitoring.  Prior history of TIA.  Now on Eliquis.  No new bleeding.  Continue same, continue follow-up with specialist  Coronary artery disease involving native coronary artery of native heart without angina pectoris Hypercholesterolemia  -Followed by cardiology.  Statins have been discussed but declined at this time.  Option of lipid clinic discussion, including other agents.  Plans to follow-up with cardiology including NMR LipoProfile monitoring.   Has made significant health changes with improved diet and exercise over the years and with continued exercise without anginal symptoms.  Continue cardiology follow-up.  History of TIA (transient ischemic attack)  -No residual symptoms, tolerating anticoagulation.  Statin discussion as above.  Continue follow-up with vascular specialist for right carotid monitoring.  Atrial fibrillation with RVR ([place])  -Considering ablation.  Currently anticoagulated as above.  Aware of symptoms when he is in A. fib with quick resolution.  Acute idiopathic gout of left ankle - Plan: predniSONE (DELTASONE) 20 MG tablet  -Most recent flare with known trigger.  Rare flares prior.  Would hold on daily allopurinol at this time unless recurrent flare, but could also check uric acid level if he does have recurrence.  Has colchicine as needed for flare, prednisone prescribed if not improving within a few days, can take with him as he will be traveling out of the country.  RTC precautions.  Meds ordered this encounter  Medications   predniSONE (DELTASONE) 20 MG tablet    Sig: Take 2 tablets (40 mg total) by mouth daily with breakfast.    Dispense:  10 tablet    Refill:  0   Patient Instructions  Keep follow up with Dr. Johnsie Davis and can discuss NMR lipoprofile, cholesterol monitoring with him.  If you would like to meet with lipid clinic specialist, I am happy to refer you to Dr. Debara Pickett as well.  No med changes for now.  If gout flares, start colchicine then prednisone if needed. If flair recurs, I do recommend uric acid testing to see if allopurinol restart needed.  Thanks for coming in today. Let me know if there are questions. Physical in next 6 months to a year at the most.       Signed,   Merri Ray, MD [place] 07/11/21 10:15 AM

## 2021-07-10 NOTE — Patient Instructions (Addendum)
Keep follow up with Dr. Johnsie Cancel and can discuss NMR lipoprofile, cholesterol monitoring with him.  If you would like to meet with lipid clinic specialist, I am happy to refer you to Dr. Debara Pickett as well.  No med changes for now.  If gout flares, start colchicine then prednisone if needed. If flair recurs, I do recommend uric acid testing to see if allopurinol restart needed.  Thanks for coming in today. Let me know if there are questions. Physical in next 6 months to a year at the most.

## 2021-07-11 ENCOUNTER — Encounter: Payer: Self-pay | Admitting: Family Medicine

## 2021-08-02 NOTE — Progress Notes (Signed)
Date:  08/10/2021   ID:  Levi Davis, DOB [birth year], MRN [id number]  Patient Location: Other:  office in [place] Provider Location: Office  PCP:  Wendie Agreste, MD  Cardiologist:  Jenkins Rouge, MD  Electrophysiologist:  None   Evaluation Performed:  Follow-Up Visit  Chief Complaint:  PAF, HLD  History of Present Illness:     66 y.o. with history of HLD, PAF. CHADVASC 3 on eliquis Last episode PAF 2013 with normal EF by echo  Stable on Multaq has seen Dr Curt Bears November 2022 and deferred ablation  Calcium score 11/27/18 192 elevated 73 rd percentile for age. Updated calcium score 05/11/21 261 74 th percentile   Has refused statin/other Rx despite high score and stroke Was sent home October 2022 with script for Lipitor 80 mg but did not fill   04/09/21 admitted with stroke visual disturbance and right hand clumsiness. 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   dronedarone (MULTAQ) 400 MG tablet Take 1 tablet (400 mg total) by mouth 2 (two) times daily with a meal.   predniSONE (DELTASONE) 20 MG tablet Take 20 mg by mouth as needed (gout flair up).     Allergies:   Patient has no known allergies.   Social History   Tobacco Use   Smoking status: Never   Smokeless tobacco: Never  Vaping Use   Vaping Use:  Never used  Substance Use Topics   Alcohol use: Yes    Comment: occasional   Drug use: Never     Family Hx: The patient's family history includes Aneurysm in his sister; Anuerysm in his mother; COPD in his father and mother; Early death in his father and mother; Heart Problems in his father; Heart disease in his father; High blood pressure in his father, mother, and sister; Obesity in his sister; Stroke in his mother.  ROS:   Please see the history of present illness.    General:no colds or fevers, no weight changes Skin:no rashes or ulcers HEENT:no blurred vision, no congestion CV:see HPI PUL:see HPI GI:no diarrhea constipation or melena, no indigestion GU:no hematuria, no dysuria MS:no joint pain, no claudication Neuro:no syncope, no lightheadedness Endo:no diabetes, no thyroid disease  All other systems reviewed and are negative.   Prior CV studies:   The following studies were reviewed today:  Ca score 05/11/21 IMPRESSION: Coronary calcium score of 261. This was 22 th percentile for age-, race-, and sex-matched controls.  TTE  04/08/21  EF 60-65%  No valve disease Mild LAE 4.2 cm     Labs/Other Tests and Data Reviewed:    EKG:  SR rate 59 LAD/LVH LAE 06/26/18   Recent Labs: 04/07/2021: ALT  17; BUN 19; Creatinine, Ser 0.89; Magnesium 2.1; Potassium 4.1; Sodium 139 04/08/2021: Hemoglobin 14.3; Platelets 183   Recent Lipid Panel Lab Results  Component Value Date/Time   CHOL 222 (H) 04/08/2021 02:25 AM   TRIG 24 04/08/2021 02:25 AM   HDL 76 04/08/2021 02:25 AM   CHOLHDL 2.9 04/08/2021 02:25 AM   LDLCALC 141 (H) 04/08/2021 02:25 AM    Wt Readings from Last 3 Encounters:  08/10/21 176 lb (79.8 kg)  07/10/21 177 lb 6.4 oz (80.5 kg)  06/02/21 173 lb 9.6 oz (78.7 kg)     Objective:    Vital Signs:  BP 122/62    Pulse 77    Ht 6' (1.829 m)    Wt 176 lb (79.8 kg)    SpO2 98%    BMI 23.87 kg/m    Affect appropriate Healthy:  appears stated age HEENT:  normal Neck supple with no adenopathy JVP normal no bruits no thyromegaly Lungs clear with no wheezing and good diaphragmatic motion Heart:  S1/S2 no murmur, no rub, gallop or click PMI normal Abdomen: benighn, BS positve, no tenderness, no AAA no bruit.  No HSM or HJR Distal pulses intact with no bruits No edema Neuro non-focal Skin warm and dry No muscular weakness    ASSESSMENT & PLAN:    PAF with minimal amount of break through episodes, he prefers to continue Multaq at current dose.  Current CHA2DS2VASc score of 3 as he has had stroke and turned 66 Has seen Dr Curt Bears 06/02/21 and deferred Tikosyn or ablation  HLD  has refused Rx with statin or other drugs discussed at length again CAD-subclinical with high ca+ score- he is active, he eats healthy -  see above regarding Rx HLD not on ASA due to need for anticoagulation  Carotid:  left ICA occlusion f/u duplex in 6 months to monitor right side    Medication Adjustments/Labs and Tests Ordered: Current medicines are reviewed at length with the patient today.  Concerns regarding medicines are outlined above.   Tests Ordered: No orders of the defined types were placed in this encounter. Carotid duplex 6 months  NMR/Calcium score July 2023   Medication Changes: No orders of the defined types were placed in this encounter.   Follow Up:  6 months   Signed, Jenkins Rouge, MD  08/10/2021 10:49 AM    [place]

## 2021-08-10 ENCOUNTER — Other Ambulatory Visit: Payer: Self-pay

## 2021-08-10 ENCOUNTER — Encounter: Payer: Self-pay | Admitting: Cardiovascular Disease

## 2021-08-10 ENCOUNTER — Ambulatory Visit (INDEPENDENT_AMBULATORY_CARE_PROVIDER_SITE_OTHER): Payer: Medicare Other | Admitting: Cardiovascular Disease

## 2021-08-10 VITALS — BP 122/62 | HR 77 | Ht 72.0 in | Wt 176.0 lb

## 2021-08-10 DIAGNOSIS — Z8673 Personal history of transient ischemic attack (TIA), and cerebral infarction without residual deficits: Secondary | ICD-10-CM | POA: Diagnosis not present

## 2021-08-10 DIAGNOSIS — I2510 Atherosclerotic heart disease of native coronary artery without angina pectoris: Secondary | ICD-10-CM | POA: Diagnosis not present

## 2021-08-10 DIAGNOSIS — E7800 Pure hypercholesterolemia, unspecified: Secondary | ICD-10-CM | POA: Diagnosis not present

## 2021-08-10 DIAGNOSIS — I6522 Occlusion and stenosis of left carotid artery: Secondary | ICD-10-CM

## 2021-08-10 DIAGNOSIS — I4819 Other persistent atrial fibrillation: Secondary | ICD-10-CM

## 2021-08-10 NOTE — Patient Instructions (Addendum)
Medication Instructions:  *If you need a refill on your cardiac medications before your next appointment, please call your pharmacy*  Lab Work: Your physician recommends that you return for lab work in July for NMR lipid profile  If you have labs (blood work) drawn today and your tests are completely normal, you will receive your results only by: MyChart Message (if you have MyChart) OR A paper copy in the mail If you have any lab test that is abnormal or we need to change your treatment, we will call you to review the results.  Testing/Procedures: Cardiac CT scanning for calcium score in July (CAT scanning), is a noninvasive, special x-ray that produces cross-sectional images of the body using x-rays and a computer. CT scans help physicians diagnose and treat medical conditions. For some CT exams, a contrast material is used to enhance visibility in the area of the body being studied. CT scans provide greater clarity and reveal more details than regular x-ray exams.  Your physician has requested that you have a carotid duplex in October. This test is an ultrasound of the carotid arteries in your neck. It looks at blood flow through these arteries that supply the brain with blood. Allow one hour for this exam. There are no restrictions or special instructions.  Follow-Up: At [facility], you and your health needs are our priority.  As part of our continuing mission to provide you with exceptional heart care, we have created designated Provider Care Teams.  These Care Teams include your primary Cardiologist (physician) and Advanced Practice Providers (APPs -  Physician Assistants and Nurse Practitioners) who all work together to provide you with the care you need, when you need it.  We recommend signing up for the patient portal called "MyChart".  Sign up information is provided on this After Visit Summary.  MyChart is used to connect with patients for Virtual Visits (Telemedicine).  Patients  are able to view lab/test results, encounter notes, upcoming appointments, etc.  Non-urgent messages can be sent to your provider as well.   To learn more about what you can do with MyChart, go to [email or web address].    Your next appointment:   6 month(s)  The format for your next appointment:   In Person  Provider:   Charlton Haws, MD {

## 2021-11-15 ENCOUNTER — Emergency Department (HOSPITAL_COMMUNITY)
Admission: EM | Admit: 2021-11-15 | Discharge: 2021-11-15 | Disposition: A | Discharge status: Home / Self Care | Payer: Medicare Other | Attending: Emergency Medicine | Admitting: Emergency Medicine

## 2021-11-15 ENCOUNTER — Emergency Department (HOSPITAL_COMMUNITY): Payer: Medicare Other

## 2021-11-15 DIAGNOSIS — Z23 Encounter for immunization: Secondary | ICD-10-CM | POA: Diagnosis not present

## 2021-11-15 DIAGNOSIS — I4891 Unspecified atrial fibrillation: Secondary | ICD-10-CM | POA: Insufficient documentation

## 2021-11-15 DIAGNOSIS — Z7901 Long term (current) use of anticoagulants: Secondary | ICD-10-CM | POA: Diagnosis not present

## 2021-11-15 DIAGNOSIS — S0101XA Laceration without foreign body of scalp, initial encounter: Secondary | ICD-10-CM | POA: Insufficient documentation

## 2021-11-15 DIAGNOSIS — R55 Syncope and collapse: Secondary | ICD-10-CM | POA: Insufficient documentation

## 2021-11-15 DIAGNOSIS — W01198A Fall on same level from slipping, tripping and stumbling with subsequent striking against other object, initial encounter: Secondary | ICD-10-CM | POA: Diagnosis not present

## 2021-11-15 DIAGNOSIS — S0990XA Unspecified injury of head, initial encounter: Secondary | ICD-10-CM | POA: Diagnosis present

## 2021-11-15 DIAGNOSIS — Y990 Civilian activity done for income or pay: Secondary | ICD-10-CM | POA: Insufficient documentation

## 2021-11-15 DIAGNOSIS — Y92015 Private garage of single-family (private) house as the place of occurrence of the external cause: Secondary | ICD-10-CM | POA: Insufficient documentation

## 2021-11-15 LAB — COMPREHENSIVE METABOLIC PANEL
ALT: 15 U/L (ref 0–44)
AST: 26 U/L (ref 15–41)
Albumin: 4 g/dL (ref 3.5–5.0)
Alkaline Phosphatase: 42 U/L (ref 38–126)
Anion gap: 8 (ref 5–15)
BUN: 17 mg/dL (ref 8–23)
CO2: 20 mmol/L — ABNORMAL LOW (ref 22–32)
Calcium: 9.1 mg/dL (ref 8.9–10.3)
Chloride: 110 mmol/L (ref 98–111)
Creatinine, Ser: 1.17 mg/dL (ref 0.61–1.24)
GFR, Estimated: >60 mL/min (ref >60)
Glucose, Bld: 100 mg/dL — ABNORMAL HIGH (ref 70–99)
Potassium: 4.3 mmol/L (ref 3.5–5.1)
Sodium: 138 mmol/L (ref 135–145)
Total Bilirubin: 1.3 mg/dL — ABNORMAL HIGH (ref 0.3–1.2)
Total Protein: 6.6 g/dL (ref 6.5–8.1)

## 2021-11-15 LAB — CBC
HCT: 42.1 % (ref 39.0–52.0)
Hemoglobin: 14.5 g/dL (ref 13.0–17.0)
MCH: 33.3 pg (ref 26.0–34.0)
MCHC: 34.4 g/dL (ref 30.0–36.0)
MCV: 96.6 fL (ref 80.0–100.0)
Platelets: 214 10*3/uL (ref 150–400)
RBC: 4.36 MIL/uL (ref 4.22–5.81)
RDW: 12.3 % (ref 11.5–15.5)
WBC: 6.4 10*3/uL (ref 4.0–10.5)
nRBC: 0 % (ref 0.0–0.2)

## 2021-11-15 LAB — ETHANOL: Alcohol, Ethyl (B): <10 mg/dL (ref <10)

## 2021-11-15 LAB — I-STAT CHEM 8, ED
BUN: 22 mg/dL (ref 8–23)
Calcium, Ion: 1.1 mmol/L — ABNORMAL LOW (ref 1.15–1.40)
Chloride: 107 mmol/L (ref 98–111)
Creatinine, Ser: 1.1 mg/dL (ref 0.61–1.24)
Glucose, Bld: 97 mg/dL (ref 70–99)
HCT: 42 % (ref 39.0–52.0)
Hemoglobin: 14.3 g/dL (ref 13.0–17.0)
Potassium: 4.2 mmol/L (ref 3.5–5.1)
Sodium: 139 mmol/L (ref 135–145)
TCO2: 22 mmol/L (ref 22–32)

## 2021-11-15 LAB — TROPONIN I (HIGH SENSITIVITY): Troponin I (High Sensitivity): 8 ng/L (ref <18)

## 2021-11-15 LAB — TYPE AND SCREEN
ABO/RH(D): A POS
Antibody Screen: NEGATIVE

## 2021-11-15 LAB — PROTIME-INR
INR: 1.2 (ref 0.8–1.2)
Prothrombin Time: 15.2 seconds (ref 11.4–15.2)

## 2021-11-15 MED ORDER — TETANUS-DIPHTH-ACELL PERTUSSIS 5-2.5-18.5 LF-MCG/0.5 IM SUSY
0.5000 mL | PREFILLED_SYRINGE | Freq: Once | INTRAMUSCULAR | Status: AC
  Administered 2021-11-15: 0.5 mL via INTRAMUSCULAR
  Filled 2021-11-15: qty 0.5
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
## 2021-11-15 NOTE — Discharge Instructions (Signed)
Your CT scan did not show any bleeding right now.  Staple removal in a week  You are in atrial fibrillation and please call your cardiologist tomorrow for appointment  Return to ER if you have persistent bleeding, headache, vomiting, chest pain, trouble breathing, palpitations
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
## 2021-11-15 NOTE — ED Provider Notes (Signed)
[facility] EMERGENCY DEPARTMENT Provider Note   CSN: [id number] Arrival date & time: 11/15/21  1547     History  Chief Complaint  Patient presents with   level 2/ fall/seizure    Levi Davis is a 66 y.o. male hx of afib on eliquis, here presenting with fall.  Patient was working on furniture with his friend at the garage.  Patient apparently had a witnessed possible seizure-like activity and fell backwards and hit his head.  Patient apparently was out for about 10 seconds and woke up on the floor before EMS arrived.  No incontinence.  Patient has no history of seizures.  Denies any chest pain prior to falling.  Patient is on Eliquis for A-fib.  Unknown tetanus  The history is provided by the patient.      Home Medications Prior to Admission medications   Medication Sig Start Date End Date Taking? Authorizing Provider  apixaban (ELIQUIS) 5 MG TABS tablet Take 5 mg by mouth 2 (two) times daily.    [provider]  dronedarone (MULTAQ) 400 MG tablet Take 1 tablet (400 mg total) by mouth 2 (two) times daily with a meal. 06/01/21 06/01/22  Levi Davis, Peter C, MD  predniSONE (DELTASONE) 20 MG tablet Take 2 tablets (40 mg total) by mouth daily with breakfast. Patient not taking: Reported on 08/10/2021 07/10/21   Shade FloodGreene, Jeffrey R, MD  predniSONE (DELTASONE) 20 MG tablet Take 20 mg by mouth as needed (gout flair up).    [provider]      Allergies    Patient has no known allergies.    Review of Systems   Review of Systems  Skin:  Positive for wound.  Neurological:  Positive for headaches.  All other systems reviewed and are negative.  Physical Exam Updated Vital Signs BP 129/84   Pulse (!) 51   Temp (!) 97.5 F (36.4 C)   Resp (!) 24   Ht 6' (1.829 m)   Wt 79.8 kg   SpO2 100%   BMI 23.86 kg/m  Physical Exam Vitals and nursing note reviewed.  Constitutional:      Comments: Awake and alert  HENT:     Head:     Comments: 10 cm  laceration in the left scalp    Nose: Nose normal.     Mouth/Throat:     Mouth: Mucous membranes are moist.  Eyes:     Extraocular Movements: Extraocular movements intact.     Pupils: Pupils are equal, round, and reactive to light.  Neck:     Comments: C-collar in place Cardiovascular:     Rate and Rhythm: Normal rate and regular rhythm.     Pulses: Normal pulses.     Heart sounds: Normal heart sounds.  Pulmonary:     Effort: Pulmonary effort is normal.     Breath sounds: Normal breath sounds.  Abdominal:     General: Abdomen is flat.     Palpations: Abdomen is soft.  Musculoskeletal:        General: Normal range of motion.     Comments: No obvious extremity trauma and no midline spinal tenderness  Skin:    General: Skin is warm.     Capillary Refill: Capillary refill takes less than 2 seconds.  Neurological:     General: No focal deficit present.     Mental Status: He is oriented to person, place, and time.     Cranial Nerves: No cranial nerve deficit.  Sensory: No sensory deficit.     Motor: No weakness.     Coordination: Coordination normal.  Psychiatric:        Mood and Affect: Mood normal.        Behavior: Behavior normal.    ED Results / Procedures / Treatments   Labs (all labs ordered are listed, but only abnormal results are displayed) Labs Reviewed  COMPREHENSIVE METABOLIC PANEL - Abnormal; Notable for the following components:      Result Value   CO2 20 (*)    Glucose, Bld 100 (*)    Total Bilirubin 1.3 (*)    All other components within normal limits  I-STAT CHEM 8, ED - Abnormal; Notable for the following components:   Calcium, Ion 1.10 (*)    All other components within normal limits  CBC  ETHANOL  PROTIME-INR  URINALYSIS, ROUTINE W REFLEX MICROSCOPIC  TYPE AND SCREEN  TROPONIN I (HIGH SENSITIVITY)  TROPONIN I (HIGH SENSITIVITY)    EKG EKG Interpretation  Date/Time:  Sunday Nov 15 2021 16:01:11 EDT Ventricular Rate:  82 PR Interval:     QRS Duration: 150 QT Interval:  413 QTC Calculation: 483 R Axis:   -51 Text Interpretation: Atrial fibrillation IVCD, consider atypical RBBB LVH with IVCD and secondary repol abnrm Borderline prolonged QT interval rate improved since previous Confirmed by Levi Davis [phone]) on 11/15/2021 4:02:53 PM  Radiology No results found.  Procedures Procedures  \  LACERATION REPAIR Performed by: Levi Davis Authorized by: Levi Davis Consent: Verbal consent obtained. Risks and benefits: risks, benefits and alternatives were discussed Consent given by: patient Patient identity confirmed: provided demographic data Prepped and Draped in normal sterile fashion Wound explored  Laceration Location: L scalp laceration   Laceration Length: 10 cm  No Foreign Bodies seen or palpated  Anesthesia: none   Irrigation method: syringe Amount of cleaning: standard  Skin closure: staples   Number of staple: 6   Technique:   Patient tolerance: Patient tolerated the procedure well with no immediate complications.   Medications Ordered in ED Medications  Tdap (BOOSTRIX) injection 0.5 mL (0.5 mLs Intramuscular Given 11/15/21 1715)    ED Course/ Medical Decision Making/ A&P                           Medical Decision Making [name] is a 66 y.o. male here presenting with fall with scalp laceration.  Unclear if he had a seizure or syncope.  Patient is back to baseline has no history of seizure.  Plan to get CT head and cervical spine to rule out bleed or fracture. We will also get blood work to check for electrolytes and cardiac function.  We will update tetanus as well  5:37 PM I reviewed patient's labs and independently reviewed his imaging.  His CT head and cervical spine did not show any bleed or fracture.  His labs were unremarkable.  Patient remains awake and alert.  Family was inquiring about treatment for A-fib.  Patient is already anticoagulated and has seen EP already.  They have  already discussed about ablation.  His heart rate is in the low 100s right now.  Patient has no chest pain or palpitations currently.  Patient states that he will follow-up with cardiology tomorrow.  I told him to continue Eliquis as prescribed.  Staple removal in a week.  Problems Addressed: Atrial fibrillation, unspecified type [facility]): acute illness or injury Scalp laceration, initial  encounter: acute illness or injury  Amount and/or Complexity of Data Reviewed Labs: ordered. Decision-making details documented in ED Course. Radiology: ordered and independent interpretation performed. Decision-making details documented in ED Course. ECG/medicine tests: ordered and independent interpretation performed. Decision-making details documented in ED Course.  Risk Prescription drug management.    Final Clinical Impression(s) / ED Diagnoses Final diagnoses:  Scalp laceration, initial encounter  Atrial fibrillation, unspecified type [facility])    Rx / DC Orders ED Discharge Orders     None         [name] Pander, MD 11/15/21 1742

## 2021-11-15 NOTE — ED Notes (Signed)
Pt verbalized understanding of d/c instructions, meds, and followup care. Denies questions. VSS, no distress noted. Steady gait to exit with all belongings.  ?

## 2021-11-17 ENCOUNTER — Telehealth: Payer: Self-pay | Admitting: Cardiology

## 2021-11-17 NOTE — Telephone Encounter (Signed)
Will forward to Vida Roller RN to set up .Zack Seal

## 2021-11-17 NOTE — Telephone Encounter (Signed)
Pt would like a callback regarding having an Ablation done. Pt states that he and Dr. Elberta Fortis discussed at his last appt. Please advise

## 2021-11-20 ENCOUNTER — Other Ambulatory Visit: Payer: Self-pay

## 2021-11-20 ENCOUNTER — Other Ambulatory Visit: Payer: Self-pay | Admitting: Cardiovascular Disease

## 2021-11-20 NOTE — Telephone Encounter (Signed)
Prescription refill request for Eliquis received.  Indication: afib  Last office visit: Nishan 08/10/2021 Scr: 1.17, 11/15/2021 Age: 66 yo  Weight: 79.8 kg   Pt is on the correct dose of Eliquis refill sent.

## 2021-11-24 ENCOUNTER — Encounter: Payer: Self-pay | Admitting: *Deleted

## 2021-11-25 NOTE — Telephone Encounter (Signed)
Pt would like to schedule PVI for 9/13. Aware I will be in touch at later time to go over instructions and schedule OV. Patient verbalized understanding and agreeable to plan.

## 2022-01-01 ENCOUNTER — Other Ambulatory Visit: Payer: Medicare Other | Admitting: *Deleted

## 2022-01-01 ENCOUNTER — Inpatient Hospital Stay: Admission: RE | Admit: 2022-01-01 | Payer: TRICARE For Life (TFL) | Source: Ambulatory Visit

## 2022-01-01 DIAGNOSIS — E7800 Pure hypercholesterolemia, unspecified: Secondary | ICD-10-CM

## 2022-01-01 DIAGNOSIS — I4819 Other persistent atrial fibrillation: Secondary | ICD-10-CM

## 2022-01-01 DIAGNOSIS — Z8673 Personal history of transient ischemic attack (TIA), and cerebral infarction without residual deficits: Secondary | ICD-10-CM

## 2022-01-01 DIAGNOSIS — I6522 Occlusion and stenosis of left carotid artery: Secondary | ICD-10-CM

## 2022-01-01 DIAGNOSIS — I2510 Atherosclerotic heart disease of native coronary artery without angina pectoris: Secondary | ICD-10-CM

## 2022-01-02 LAB — NMR, LIPOPROFILE
Cholesterol, Total: 209 mg/dL — ABNORMAL HIGH (ref 100–199)
HDL Particle Number: 33 umol/L (ref >=30.5)
HDL-C: 63 mg/dL (ref >39)
LDL Particle Number: 1369 nmol/L — ABNORMAL HIGH (ref <1000)
LDL Size: 21.5 nm (ref >20.5)
LDL-C (NIH Calc): 135 mg/dL — ABNORMAL HIGH (ref 0–99)
LP-IR Score: <25 (ref <=45)
Small LDL Particle Number: 301 nmol/L (ref <=527)
Triglycerides: 63 mg/dL (ref 0–149)

## 2022-02-04 ENCOUNTER — Encounter: Admitting: Family Medicine

## 2022-02-09 ENCOUNTER — Telehealth: Payer: Self-pay | Admitting: *Deleted

## 2022-02-09 DIAGNOSIS — I4819 Other persistent atrial fibrillation: Secondary | ICD-10-CM

## 2022-02-09 NOTE — Telephone Encounter (Signed)
Left message to call back to go over procedure instructions (ablation 9/13) and arrange pre procedure OV/labs.

## 2022-02-10 NOTE — Telephone Encounter (Signed)
Pt returning nurse's call. Please advise

## 2022-02-10 NOTE — Telephone Encounter (Signed)
Pt scheduled to see Dr. Elberta Fortis next week. Aware someone will call to pre ablation CT test. Aware we will obtain needed blood work at next weeks OV and will review CT & procedure instructions then. Patient verbalized understanding and agreeable to plan.

## 2022-02-12 ENCOUNTER — Encounter: Admitting: Family Medicine

## 2022-02-19 ENCOUNTER — Encounter: Payer: Self-pay | Admitting: *Deleted

## 2022-02-19 ENCOUNTER — Ambulatory Visit (INDEPENDENT_AMBULATORY_CARE_PROVIDER_SITE_OTHER): Payer: Medicare Other | Admitting: Cardiology

## 2022-02-19 ENCOUNTER — Encounter: Payer: Self-pay | Admitting: Cardiology

## 2022-02-19 VITALS — BP 112/78 | HR 46 | Ht 72.0 in | Wt 176.0 lb

## 2022-02-19 DIAGNOSIS — Z01812 Encounter for preprocedural laboratory examination: Secondary | ICD-10-CM | POA: Diagnosis not present

## 2022-02-19 DIAGNOSIS — D6869 Other thrombophilia: Secondary | ICD-10-CM | POA: Diagnosis not present

## 2022-02-19 DIAGNOSIS — I6522 Occlusion and stenosis of left carotid artery: Secondary | ICD-10-CM

## 2022-02-19 DIAGNOSIS — I480 Paroxysmal atrial fibrillation: Secondary | ICD-10-CM | POA: Diagnosis not present

## 2022-02-19 NOTE — H&P (View-Only) (Signed)
 Electrophysiology Office Note   Date:  02/19/2022   ID:  Lawrence Blankenbeckler, DOB 10/31/1955, MRN [id number]  PCP:  Greene, Jeffrey R, MD  Cardiologist:  Nishan, Weaver Primary Electrophysiologist:  Geraldyn Shain Martin Joory Gough, MD    Chief Complaint: AF   History of Present Illness: Levi Davis is a 66 y.o. male who is being seen today for the evaluation of AF at the request of Greene, Jeffrey R, MD. Presenting today for electrophysiology evaluation.  He has a history significant atrial fibrillation.  He also has hyperlipidemia.  He had a posterior circulation TIA 04/07/2021.  He was found to be in atrial fibrillation on presentation to the emergency room.  He had a chronic left ICA occlusion with silent left-sided watershed infarcts on MRI.  He is scheduled for atrial fibrillation ablation 03/10/2022.  Today, denies symptoms of palpitations, chest pain, shortness of breath, orthopnea, PND, lower extremity edema, claudication, dizziness, presyncope, syncope, bleeding, or neurologic sequela. The patient is tolerating medications without difficulties.      Past Medical History:  Diagnosis Date   A-fib (HCC) 06/23/2018   Arrhythmia 2013   AFIB   Closed right ankle fracture    Gout    Hypercholesterolemia    Stroke (HCC)    Past Surgical History:  Procedure Laterality Date   broke leg     screws and pins     Current Outpatient Medications  Medication Sig Dispense Refill   apixaban (ELIQUIS) 5 MG TABS tablet TAKE 1 TABLET TWICE A DAY 180 tablet 1   dronedarone (MULTAQ) 400 MG tablet Take 1 tablet (400 mg total) by mouth 2 (two) times daily with a meal. 180 tablet 3   metoprolol tartrate (LOPRESSOR) 25 MG tablet Take 25 mg by mouth daily.     predniSONE (DELTASONE) 20 MG tablet Take 20 mg by mouth as needed (gout flair up).     No current facility-administered medications for this visit.    Allergies:   Patient has no known allergies.   Social History:  The patient  reports that he  has never smoked. He has never used smokeless tobacco. He reports current alcohol use. He reports that he does not use drugs.   Family History:  The patient's family history includes Aneurysm in his sister; Anuerysm in his mother; COPD in his father and mother; Early death in his father and mother; Heart Problems in his father; Heart disease in his father; High blood pressure in his father, mother, and sister; Obesity in his sister; Stroke in his mother.   ROS:  Please see the history of present illness.   Otherwise, review of systems is positive for none.   All other systems are reviewed and negative.   PHYSICAL EXAM: VS:  BP 112/78   Pulse (!) 46   Ht 6' (1.829 m)   Wt 176 lb (79.8 kg)   SpO2 98%   BMI 23.87 kg/m  , BMI Body mass index is 23.87 kg/m. GEN: Well nourished, well developed, in no acute distress  HEENT: normal  Neck: no JVD, carotid bruits, or masses Cardiac: RRR; no murmurs, rubs, or gallops,no edema  Respiratory:  clear to auscultation bilaterally, normal work of breathing GI: soft, nontender, nondistended, + BS MS: no deformity or atrophy  Skin: warm and dry Neuro:  Strength and sensation are intact Psych: euthymic mood, full affect  EKG:  EKG is ordered today. Personal review of the ekg ordered shows sinus rhythm   Recent Labs: 04/07/2021: Magnesium 2.1 11/15/2021:   ALT 15; BUN 22; Creatinine, Ser 1.10; Hemoglobin 14.3; Platelets 214; Potassium 4.2; Sodium 139    Lipid Panel     Component Value Date/Time   CHOL 222 (H) 04/08/2021 0225   TRIG 24 04/08/2021 0225   HDL 76 04/08/2021 0225   CHOLHDL 2.9 04/08/2021 0225   VLDL 5 04/08/2021 0225   LDLCALC 141 (H) 04/08/2021 0225     Wt Readings from Last 3 Encounters:  02/19/22 176 lb (79.8 kg)  11/15/21 175 lb 14.8 oz (79.8 kg)  08/10/21 176 lb (79.8 kg)      Other studies Reviewed: Additional studies/ records that were reviewed today include: TTE 04/08/21  Review of the above records today  demonstrates:   1. No bubble study performed.   2. Abnormal septal motion. Left ventricular ejection fraction, by  estimation, is 55 to 60%. The left ventricle has normal function. The left  ventricle has no regional wall motion abnormalities. Left ventricular  diastolic parameters were normal.   3. Right ventricular systolic function is normal. The right ventricular  size is normal.   4. The mitral valve is normal in structure. No evidence of mitral valve  regurgitation. No evidence of mitral stenosis.   5. The aortic valve is normal in structure. Aortic valve regurgitation is  not visualized. No aortic stenosis is present.   6. The inferior vena cava is normal in size with greater than 50%  respiratory variability, suggesting right atrial pressure of 3 mmHg.    ASSESSMENT AND PLAN:  1.  Paroxysmal atrial fibrillation: Currently on Multaq 400 mg twice daily, Eliquis 5 mg twice daily.  CHA2DS2-VASc of 2.  High risk medication monitoring for Multaq.  He is scheduled for A-fib ablation 03/10/2022.  2.  Cryptogenic stroke: Currently on Eliquis for atrial fibrillation  3.  Hyperlipidemia: Statin per primary cardiology  4.  Secondary hypercoagulable state: Currently on Eliquis for atrial fibrillation as above   Current medicines are reviewed at length with the patient today.   The patient does not have concerns regarding his medicines.  The following changes were made today:  none  Labs/ tests ordered today include:  Orders Placed This Encounter  Procedures   Basic metabolic panel   CBC   EKG 12-Lead     Disposition:   FU with Lynnex Fulp 3 months  Signed, Aurelio Mccamy Jorja Loa, MD  02/19/2022 1:59 PM     [facility] [address] [place] [address] [phone] (office) [phone] (fax)

## 2022-02-19 NOTE — Progress Notes (Signed)
Electrophysiology Office Note   Date:  02/19/2022   ID:  Levi Davis, DOB [birth year], MRN [id number]  PCP:  Levi Flood, MD  Cardiologist:  Levi Davis Primary Electrophysiologist:  Levi Lemming, MD    Chief Complaint: AF   History of Present Illness: Levi Davis is a 66 y.o. male who is being seen today for the evaluation of AF at the request of Levi Flood, MD. Presenting today for electrophysiology evaluation.  He has a history significant atrial fibrillation.  He also has hyperlipidemia.  He had a posterior circulation TIA 04/07/2021.  He was found to be in atrial fibrillation on presentation to the emergency room.  He had a chronic left ICA occlusion with silent left-sided watershed infarcts on MRI.  He is scheduled for atrial fibrillation ablation 03/10/2022.  Today, denies symptoms of palpitations, chest pain, shortness of breath, orthopnea, PND, lower extremity edema, claudication, dizziness, presyncope, syncope, bleeding, or neurologic sequela. The patient is tolerating medications without difficulties.      Past Medical History:  Diagnosis Date   A-fib (HCC) 06/23/2018   Arrhythmia 2013   AFIB   Closed right ankle fracture    Gout    Hypercholesterolemia    Stroke [facility])    Past Surgical History:  Procedure Laterality Date   broke leg     screws and pins     Current Outpatient Medications  Medication Sig Dispense Refill   apixaban (ELIQUIS) 5 MG TABS tablet TAKE 1 TABLET TWICE A DAY 180 tablet 1   dronedarone (MULTAQ) 400 MG tablet Take 1 tablet (400 mg total) by mouth 2 (two) times daily with a meal. 180 tablet 3   metoprolol tartrate (LOPRESSOR) 25 MG tablet Take 25 mg by mouth daily.     predniSONE (DELTASONE) 20 MG tablet Take 20 mg by mouth as needed (gout flair up).     No current facility-administered medications for this visit.    Allergies:   Patient has no known allergies.   Social History:  The patient  reports that he  has never smoked. He has never used smokeless tobacco. He reports current alcohol use. He reports that he does not use drugs.   Family History:  The patient's family history includes Aneurysm in his sister; Anuerysm in his mother; COPD in his father and mother; Early death in his father and mother; Heart Problems in his father; Heart disease in his father; High blood pressure in his father, mother, and sister; Obesity in his sister; Stroke in his mother.   ROS:  Please see the history of present illness.   Otherwise, review of systems is positive for none.   All other systems are reviewed and negative.   PHYSICAL EXAM: VS:  BP 112/78   Pulse (!) 46   Ht 6' (1.829 m)   Wt 176 lb (79.8 kg)   SpO2 98%   BMI 23.87 kg/m  , BMI Body mass index is 23.87 kg/m. GEN: Well nourished, well developed, in no acute distress  HEENT: normal  Neck: no JVD, carotid bruits, or masses Cardiac: RRR; no murmurs, rubs, or gallops,no edema  Respiratory:  clear to auscultation bilaterally, normal work of breathing GI: soft, nontender, nondistended, + BS MS: no deformity or atrophy  Skin: warm and dry Neuro:  Strength and sensation are intact Psych: euthymic mood, full affect  EKG:  EKG is ordered today. Personal review of the ekg ordered shows sinus rhythm   Recent Labs: 04/07/2021: Magnesium 2.1 11/15/2021:  ALT 15; BUN 22; Creatinine, Ser 1.10; Hemoglobin 14.3; Platelets 214; Potassium 4.2; Sodium 139    Lipid Panel     Component Value Date/Time   CHOL 222 (H) 04/08/2021 0225   TRIG 24 04/08/2021 0225   HDL 76 04/08/2021 0225   CHOLHDL 2.9 04/08/2021 0225   VLDL 5 04/08/2021 0225   LDLCALC 141 (H) 04/08/2021 0225     Wt Readings from Last 3 Encounters:  02/19/22 176 lb (79.8 kg)  11/15/21 175 lb 14.8 oz (79.8 kg)  08/10/21 176 lb (79.8 kg)      Other studies Reviewed: Additional studies/ records that were reviewed today include: TTE 04/08/21  Review of the above records today  demonstrates:   1. No bubble study performed.   2. Abnormal septal motion. Left ventricular ejection fraction, by  estimation, is 55 to 60%. The left ventricle has normal function. The left  ventricle has no regional wall motion abnormalities. Left ventricular  diastolic parameters were normal.   3. Right ventricular systolic function is normal. The right ventricular  size is normal.   4. The mitral valve is normal in structure. No evidence of mitral valve  regurgitation. No evidence of mitral stenosis.   5. The aortic valve is normal in structure. Aortic valve regurgitation is  not visualized. No aortic stenosis is present.   6. The inferior vena cava is normal in size with greater than 50%  respiratory variability, suggesting right atrial pressure of 3 mmHg.    ASSESSMENT AND PLAN:  1.  Paroxysmal atrial fibrillation: Currently on Multaq 400 mg twice daily, Eliquis 5 mg twice daily.  CHA2DS2-VASc of 2.  High risk medication monitoring for Multaq.  He is scheduled for A-fib ablation 03/10/2022.  2.  Cryptogenic stroke: Currently on Eliquis for atrial fibrillation  3.  Hyperlipidemia: Statin per primary cardiology  4.  Secondary hypercoagulable state: Currently on Eliquis for atrial fibrillation as above   Current medicines are reviewed at length with the patient today.   The patient does not have concerns regarding his medicines.  The following changes were made today:  none  Labs/ tests ordered today include:  Orders Placed This Encounter  Procedures   Basic metabolic panel   CBC   EKG 12-Lead     Disposition:   FU with Levi Davis 3 months  Signed, Danicia Terhaar Jorja Loa, MD  02/19/2022 1:59 PM     [facility] [address] [place] [address] [phone] (office) [phone] (fax)

## 2022-02-19 NOTE — Patient Instructions (Addendum)
Medication Instructions:  Your physician recommends that you continue on your current medications as directed. Please refer to the Current Medication list given to you today.  *If you need a refill on your cardiac medications before your next appointment, please call your pharmacy*  Lab Work: Pre procedure labs today:  BMP & CBC  If you have labs (blood work) drawn today and your tests are completely normal, you will receive your results only by: MyChart Message (if you have MyChart) OR A paper copy in the mail If you have any lab test that is abnormal or we need to change your treatment, we will call you to review the results.   Testing/Procedures: Your physician has requested that you have cardiac CT within 7 days PRIOR to your ablation. Cardiac computed tomography (CT) is a painless test that uses an x-ray machine to take clear, detailed pictures of your heart.  Please follow instruction below located under "other instructions". You will get a call from our office to schedule the date for this test.  Your physician has recommended that you have an ablation. Catheter ablation is a medical procedure used to treat some cardiac arrhythmias (irregular heartbeats). During catheter ablation, a long, thin, flexible tube is put into a blood vessel in your groin (upper thigh), or neck. This tube is called an ablation catheter. It is then guided to your heart through the blood vessel. Radio frequency waves destroy small areas of heart tissue where abnormal heartbeats may cause an arrhythmia to start. Please follow instruction letter given to you today.   Follow-Up: At [facility], you and your health needs are our priority.  As part of our continuing mission to provide you with exceptional heart care, we have created designated Provider Care Teams.  These Care Teams include your primary Cardiologist (physician) and Advanced Practice Providers (APPs -  Physician Assistants and Nurse Practitioners)  who all work together to provide you with the care you need, when you need it.  We recommend signing up for the patient portal called "MyChart".  Sign up information is provided on this After Visit Summary.  MyChart is used to connect with patients for Virtual Visits (Telemedicine).  Patients are able to view lab/test results, encounter notes, upcoming appointments, etc.  Non-urgent messages can be sent to your provider as well.   To learn more about what you can do with MyChart, go to [email or web address].    Your next appointment:   1 month(s) after your ablation  The format for your next appointment:   In Person  Provider:   AFib clinic   Thank you for choosing [facility]!!   Gervis Gaba, RN [phone]    Other Instructions   Cardiac Ablation Cardiac ablation is a procedure to destroy (ablate) some heart tissue that is sending bad signals. These bad signals cause problems in heart rhythm. The heart has many areas that make these signals. If there are problems in these areas, they can make the heart beat in a way that is not normal. Destroying some tissues can help make the heart rhythm normal. Tell your doctor about: Any allergies you have. All medicines you are taking. These include vitamins, herbs, eye drops, creams, and over-the-counter medicines. Any problems you or family members have had with medicines that make you fall asleep (anesthetics). Any blood disorders you have. Any surgeries you have had. Any medical conditions you have, such as kidney failure. Whether you are pregnant or may be pregnant. What are the risks?   This is a safe procedure. But problems may occur, including: Infection. Bruising and bleeding. Bleeding into the chest. Stroke or blood clots. Damage to nearby areas of your body. Allergies to medicines or dyes. The need for a pacemaker if the normal system is damaged. Failure of the procedure to treat the problem. What happens before  the procedure? Medicines Ask your doctor about: Changing or stopping your normal medicines. This is important. Taking aspirin and ibuprofen. Do not take these medicines unless your doctor tells you to take them. Taking other medicines, vitamins, herbs, and supplements. General instructions Follow instructions from your doctor about what you cannot eat or drink. Plan to have someone take you home from the hospital or clinic. If you will be going home right after the procedure, plan to have someone with you for 24 hours. Ask your doctor what steps will be taken to prevent infection. What happens during the procedure?  An IV tube will be put into one of your veins. You will be given a medicine to help you relax. The skin on your neck or groin will be numbed. A cut (incision) will be made in your neck or groin. A needle will be put through your cut and into a large vein. A tube (catheter) will be put into the needle. The tube will be moved to your heart. Dye may be put through the tube. This helps your doctor see your heart. Small devices (electrodes) on the tube will send out signals. A type of energy will be used to destroy some heart tissue. The tube will be taken out. Pressure will be held on your cut. This helps stop bleeding. A bandage will be put over your cut. The exact procedure may vary among doctors and hospitals. What happens after the procedure? You will be watched until you leave the hospital or clinic. This includes checking your heart rate, breathing rate, oxygen, and blood pressure. Your cut will be watched for bleeding. You will need to lie still for a few hours. Do not drive for 24 hours or as long as your doctor tells you. Summary Cardiac ablation is a procedure to destroy some heart tissue. This is done to treat heart rhythm problems. Tell your doctor about any medical conditions you may have. Tell him or her about all medicines you are taking to treat them. This is a  safe procedure. But problems may occur. These include infection, bruising, bleeding, and damage to nearby areas of your body. Follow what your doctor tells you about food and drink. You may also be told to change or stop some of your medicines. After the procedure, do not drive for 24 hours or as long as your doctor tells you. This information is not intended to replace advice given to you by your health care provider. Make sure you discuss any questions you have with your health care provider. Document Revised: 09/04/2021 Document Reviewed: 05/17/2019 Elsevier Patient Education  2023 [employer] Inc[employer]   

## 2022-02-20 LAB — CBC
Hematocrit: 42 % (ref 37.5–51.0)
Hemoglobin: 14.4 g/dL (ref 13.0–17.7)
MCH: 32.4 pg (ref 26.6–33.0)
MCHC: 34.3 g/dL (ref 31.5–35.7)
MCV: 95 fL (ref 79–97)
Platelets: 247 10*3/uL (ref 150–450)
RBC: 4.44 x10E6/uL (ref 4.14–5.80)
RDW: 12.2 % (ref 11.6–15.4)
WBC: 7 10*3/uL (ref 3.4–10.8)

## 2022-02-20 LAB — BASIC METABOLIC PANEL
BUN/Creatinine Ratio: 20 (ref 10–24)
BUN: 23 mg/dL (ref 8–27)
CO2: 21 mmol/L (ref 20–29)
Calcium: 9.4 mg/dL (ref 8.6–10.2)
Chloride: 101 mmol/L (ref 96–106)
Creatinine, Ser: 1.17 mg/dL (ref 0.76–1.27)
Glucose: 90 mg/dL (ref 70–99)
Potassium: 4.8 mmol/L (ref 3.5–5.2)
Sodium: 139 mmol/L (ref 134–144)
eGFR: 69 mL/min/{1.73_m2} (ref >59)

## 2022-03-04 ENCOUNTER — Ambulatory Visit (HOSPITAL_BASED_OUTPATIENT_CLINIC_OR_DEPARTMENT_OTHER)
Admission: RE | Admit: 2022-03-04 | Discharge: 2022-03-04 | Disposition: A | Discharge status: Home / Self Care | Payer: Medicare Other | Source: Ambulatory Visit | Attending: Cardiology | Admitting: Cardiology

## 2022-03-04 ENCOUNTER — Encounter (HOSPITAL_BASED_OUTPATIENT_CLINIC_OR_DEPARTMENT_OTHER): Payer: Self-pay

## 2022-03-04 DIAGNOSIS — I4819 Other persistent atrial fibrillation: Secondary | ICD-10-CM | POA: Diagnosis present

## 2022-03-04 LAB — POCT I-STAT CREATININE: Creatinine, Ser: 1.2 mg/dL (ref 0.61–1.24)

## 2022-03-04 MED ORDER — IOHEXOL 350 MG/ML SOLN
100.0000 mL | Freq: Once | INTRAVENOUS | Status: AC | PRN
  Administered 2022-03-04: 80 mL via INTRAVENOUS

## 2022-03-09 NOTE — Pre-Procedure Instructions (Signed)
Instructed patient on the following items: °Arrival time 0930 °Nothing to eat or drink after midnight °No meds AM of procedure °Responsible person to drive you home and stay with you for 24 hrs ° °Have you missed any doses of anti-coagulant Eliquis- hasn't missed any doses °   °

## 2022-03-10 ENCOUNTER — Ambulatory Visit (HOSPITAL_BASED_OUTPATIENT_CLINIC_OR_DEPARTMENT_OTHER): Payer: Medicare Other | Admitting: Anesthesiology

## 2022-03-10 ENCOUNTER — Ambulatory Visit (HOSPITAL_COMMUNITY)
Admission: RE | Admit: 2022-03-10 | Discharge: 2022-03-10 | Disposition: A | Discharge status: Home / Self Care | Payer: Medicare Other | Attending: Cardiology | Admitting: Cardiology

## 2022-03-10 ENCOUNTER — Encounter (HOSPITAL_COMMUNITY)
Admission: RE | Disposition: A | Discharge status: Home / Self Care | Payer: Self-pay | Source: Home / Self Care | Attending: Cardiology

## 2022-03-10 ENCOUNTER — Other Ambulatory Visit: Payer: Self-pay

## 2022-03-10 ENCOUNTER — Ambulatory Visit (HOSPITAL_COMMUNITY): Payer: Medicare Other | Admitting: Anesthesiology

## 2022-03-10 DIAGNOSIS — Z79899 Other long term (current) drug therapy: Secondary | ICD-10-CM | POA: Diagnosis not present

## 2022-03-10 DIAGNOSIS — I480 Paroxysmal atrial fibrillation: Secondary | ICD-10-CM | POA: Diagnosis present

## 2022-03-10 DIAGNOSIS — I4891 Unspecified atrial fibrillation: Secondary | ICD-10-CM

## 2022-03-10 DIAGNOSIS — D6869 Other thrombophilia: Secondary | ICD-10-CM | POA: Diagnosis not present

## 2022-03-10 DIAGNOSIS — Z7901 Long term (current) use of anticoagulants: Secondary | ICD-10-CM | POA: Diagnosis not present

## 2022-03-10 DIAGNOSIS — E785 Hyperlipidemia, unspecified: Secondary | ICD-10-CM | POA: Insufficient documentation

## 2022-03-10 DIAGNOSIS — Z8673 Personal history of transient ischemic attack (TIA), and cerebral infarction without residual deficits: Secondary | ICD-10-CM | POA: Diagnosis not present

## 2022-03-10 HISTORY — PX: ATRIAL FIBRILLATION ABLATION: EP1191

## 2022-03-10 SURGERY — ATRIAL FIBRILLATION ABLATION
Anesthesia: General

## 2022-03-10 MED ORDER — HEPARIN SODIUM (PORCINE) 1000 UNIT/ML IJ SOLN
INTRAMUSCULAR | Status: DC | PRN
  Administered 2022-03-10: 6000 [IU] via INTRAVENOUS
  Administered 2022-03-10: 13000 [IU] via INTRAVENOUS

## 2022-03-10 MED ORDER — EPHEDRINE SULFATE-NACL 50-0.9 MG/10ML-% IV SOSY
PREFILLED_SYRINGE | INTRAVENOUS | Status: DC | PRN
  Administered 2022-03-10: 7.5 mg via INTRAVENOUS

## 2022-03-10 MED ORDER — LIDOCAINE 2% (20 MG/ML) 5 ML SYRINGE
INTRAMUSCULAR | Status: DC | PRN
  Administered 2022-03-10: 60 mg via INTRAVENOUS

## 2022-03-10 MED ORDER — PHENYLEPHRINE HCL-NACL 20-0.9 MG/250ML-% IV SOLN
INTRAVENOUS | Status: DC | PRN
  Administered 2022-03-10: 40 ug/min via INTRAVENOUS

## 2022-03-10 MED ORDER — DOBUTAMINE INFUSION FOR EP/ECHO/NUC (1000 MCG/ML)
INTRAVENOUS | Status: AC
  Filled 2022-03-10: qty 250

## 2022-03-10 MED ORDER — ACETAMINOPHEN 325 MG PO TABS: 650.0000 mg | ORAL_TABLET | ORAL | Status: DC | PRN

## 2022-03-10 MED ORDER — ROCURONIUM BROMIDE 10 MG/ML (PF) SYRINGE
PREFILLED_SYRINGE | INTRAVENOUS | Status: DC | PRN
  Administered 2022-03-10: 20 mg via INTRAVENOUS
  Administered 2022-03-10: 60 mg via INTRAVENOUS

## 2022-03-10 MED ORDER — SODIUM CHLORIDE 0.9 % IV SOLN: INTRAVENOUS | Status: DC

## 2022-03-10 MED ORDER — HEPARIN (PORCINE) IN NACL 1000-0.9 UT/500ML-% IV SOLN
INTRAVENOUS | Status: DC | PRN
  Administered 2022-03-10 (×4): 500 mL

## 2022-03-10 MED ORDER — PROPOFOL 10 MG/ML IV BOLUS
INTRAVENOUS | Status: DC | PRN
  Administered 2022-03-10: 20 mg via INTRAVENOUS
  Administered 2022-03-10: 140 mg via INTRAVENOUS
  Administered 2022-03-10: 40 mg via INTRAVENOUS

## 2022-03-10 MED ORDER — SODIUM CHLORIDE 0.9% FLUSH: 3.0000 mL | INTRAVENOUS | Status: DC | PRN

## 2022-03-10 MED ORDER — FENTANYL CITRATE (PF) 100 MCG/2ML IJ SOLN
INTRAMUSCULAR | Status: DC | PRN
  Administered 2022-03-10 (×2): 50 ug via INTRAVENOUS

## 2022-03-10 MED ORDER — SODIUM CHLORIDE 0.9 % IV SOLN: 250.0000 mL | INTRAVENOUS | Status: DC | PRN

## 2022-03-10 MED ORDER — DEXAMETHASONE SODIUM PHOSPHATE 10 MG/ML IJ SOLN
INTRAMUSCULAR | Status: DC | PRN
  Administered 2022-03-10: 10 mg via INTRAVENOUS

## 2022-03-10 MED ORDER — HEPARIN SODIUM (PORCINE) 1000 UNIT/ML IJ SOLN
INTRAMUSCULAR | Status: AC
  Filled 2022-03-10: qty 10

## 2022-03-10 MED ORDER — PROTAMINE SULFATE 10 MG/ML IV SOLN
INTRAVENOUS | Status: DC | PRN
  Administered 2022-03-10: 40 mg via INTRAVENOUS

## 2022-03-10 MED ORDER — DOBUTAMINE INFUSION FOR EP/ECHO/NUC (1000 MCG/ML)
INTRAVENOUS | Status: DC | PRN
  Administered 2022-03-10: 20 ug/kg/min via INTRAVENOUS

## 2022-03-10 MED ORDER — HEPARIN SODIUM (PORCINE) 1000 UNIT/ML IJ SOLN
INTRAMUSCULAR | Status: DC | PRN
  Administered 2022-03-10: 1000 [IU] via INTRAVENOUS

## 2022-03-10 MED ORDER — SUGAMMADEX SODIUM 200 MG/2ML IV SOLN
INTRAVENOUS | Status: DC | PRN
  Administered 2022-03-10: 200 mg via INTRAVENOUS

## 2022-03-10 MED ORDER — ONDANSETRON HCL 4 MG/2ML IJ SOLN: 4.0000 mg | Freq: Four times a day (QID) | INTRAMUSCULAR | Status: DC | PRN

## 2022-03-10 MED ORDER — FENTANYL CITRATE (PF) 250 MCG/5ML IJ SOLN
INTRAMUSCULAR | Status: DC | PRN
Start: 2022-03-10 — End: 2022-03-10

## 2022-03-10 MED ORDER — SODIUM CHLORIDE 0.9% FLUSH: 3.0000 mL | Freq: Two times a day (BID) | INTRAVENOUS | Status: DC

## 2022-03-10 MED ORDER — ONDANSETRON HCL 4 MG/2ML IJ SOLN
INTRAMUSCULAR | Status: DC | PRN
  Administered 2022-03-10: 4 mg via INTRAVENOUS

## 2022-03-10 MED ORDER — PHENYLEPHRINE 80 MCG/ML (10ML) SYRINGE FOR IV PUSH (FOR BLOOD PRESSURE SUPPORT)
PREFILLED_SYRINGE | INTRAVENOUS | Status: DC | PRN
  Administered 2022-03-10 (×2): 160 ug via INTRAVENOUS

## 2022-03-10 SURGICAL SUPPLY — 18 items
BAG SNAP BAND KOVER 36X36 (MISCELLANEOUS) IMPLANT
CATH ABLAT QDOT MICRO BI TC DF (CATHETERS) IMPLANT
CATH OCTARAY 2.0 F 3-3-3-3-3 (CATHETERS) IMPLANT
CATH PIGTAIL STEERABLE D1 8.7 (WIRE) IMPLANT
CATH SOUNDSTAR ECO 8FR (CATHETERS) IMPLANT
CATH WEB BI DIR CSDF CRV REPRO (CATHETERS) IMPLANT
CLOSURE PERCLOSE PROSTYLE (VASCULAR PRODUCTS) IMPLANT
COVER SWIFTLINK CONNECTOR (BAG) ×1 IMPLANT
PACK EP LATEX FREE (CUSTOM PROCEDURE TRAY) ×1
PACK EP LF (CUSTOM PROCEDURE TRAY) ×1 IMPLANT
PAD DEFIB RADIO PHYSIO CONN (PAD) ×1 IMPLANT
PATCH CARTO3 (PAD) IMPLANT
SHEATH CARTO VIZIGO SM CVD (SHEATH) IMPLANT
SHEATH PINNACLE 7F 10CM (SHEATH) IMPLANT
SHEATH PINNACLE 8F 10CM (SHEATH) IMPLANT
SHEATH PINNACLE 9F 10CM (SHEATH) IMPLANT
SHEATH PROBE COVER 6X72 (BAG) IMPLANT
TUBING SMART ABLATE COOLFLOW (TUBING) IMPLANT

## 2022-03-10 NOTE — Discharge Instructions (Signed)
Post procedure care instructions No driving for 4 days. No lifting over 5 lbs for 1 week. No vigorous or sexual activity for 1 week. You may return to work/your usual activities on 03/18/22. Keep procedure site clean & dry. If you notice increased pain, swelling, bleeding or pus, call/return!  You may shower after 24 hours, but no soaking in baths/hot tubs/pools for 1 week.    You have an appointment set up with the Atrial Fibrillation Clinic.  Multiple studies have shown that being followed by a dedicated atrial fibrillation clinic in addition to the standard care you receive from your other physicians improves health. We believe that enrollment in the atrial fibrillation clinic will allow [place] to better care for you.   The phone number to the Atrial Fibrillation Clinic is [phone]. The clinic is staffed Monday through Friday from 8:30am to 5pm.  Parking Directions: The clinic is located in the Heart and Vascular Building connected to [facility]. 1)From [address] turn on to [address] and go to the 3rd entrance  (Heart and Vascular entrance) on the right. 2)Look to the right for Heart &Vascular Parking Garage. 3)A code for the entrance is required, for October is 1504.   4)Take the elevators to the 1st floor. Registration is in the room with the glass walls at the end of the hallway.  If you have any trouble parking or locating the clinic, please don't hesitate to call [phone].

## 2022-03-10 NOTE — Anesthesia Postprocedure Evaluation (Signed)
Anesthesia Post Note  Patient: Levi Davis  Procedure(s) Performed: ATRIAL FIBRILLATION ABLATION     Patient location during evaluation: PACU Anesthesia Type: General Level of consciousness: awake and alert Pain management: pain level controlled Vital Signs Assessment: post-procedure vital signs reviewed and stable Respiratory status: spontaneous breathing, nonlabored ventilation, respiratory function stable and patient connected to nasal cannula oxygen Cardiovascular status: blood pressure returned to baseline and stable Postop Assessment: no apparent nausea or vomiting Anesthetic complications: no   There were no known notable events for this encounter.  Last Vitals:  Vitals:   03/10/22 1630 03/10/22 1700  BP: (!) 103/55 (!) 104/59  Pulse: (!) 47 (!) 48  Resp: 13 15  Temp:    SpO2: 100% 100%    Last Pain:  Vitals:   03/10/22 1515  TempSrc:   PainSc: 0-No pain                 Shelton Silvas

## 2022-03-10 NOTE — Interval H&P Note (Signed)
History and Physical Interval Note:  03/10/2022 9:44 AM  Levi Davis  has presented today for surgery, with the diagnosis of afib.  The various methods of treatment have been discussed with the patient and family. After consideration of risks, benefits and other options for treatment, the patient has consented to  Procedure(s): ATRIAL FIBRILLATION ABLATION (N/A) as a surgical intervention.  The patient's history has been reviewed, patient examined, no change in status, stable for surgery.  I have reviewed the patient's chart and labs.  Questions were answered to the patient's satisfaction.     Lynix Bonine [employer]

## 2022-03-10 NOTE — Transfer of Care (Signed)
Immediate Anesthesia Transfer of Care Note  Patient: Levi Davis  Procedure(s) Performed: ATRIAL FIBRILLATION ABLATION  Patient Location: PACU and Cath Lab  Anesthesia Type:General  Level of Consciousness: awake, oriented and patient cooperative  Airway & Oxygen Therapy: Patient Spontanous Breathing and Patient connected to nasal cannula oxygen  Post-op Assessment: Report given to RN and Post -op Vital signs reviewed and stable  Post vital signs: Reviewed and stable  Last Vitals:  Vitals Value Taken Time  BP    Temp    Pulse 63 03/10/22 1440  Resp 19 03/10/22 1440  SpO2 96 % 03/10/22 1440  Vitals shown include unvalidated device data.  Last Pain:  Vitals:   03/10/22 0918  TempSrc: Tympanic         Complications: There were no known notable events for this encounter.

## 2022-03-10 NOTE — Anesthesia Procedure Notes (Signed)
Procedure Name: Intubation Date/Time: 03/10/2022 12:54 PM  Performed by: Janace Litten, CRNAPre-anesthesia Checklist: Patient identified, Emergency Drugs available, Suction available and Patient being monitored Patient Re-evaluated:Patient Re-evaluated prior to induction Oxygen Delivery Method: Circle System Utilized Preoxygenation: Pre-oxygenation with 100% oxygen Induction Type: IV induction Ventilation: Mask ventilation without difficulty Laryngoscope Size: Mac and 4 Grade View: Grade I Tube type: Oral Tube size: 7.5 mm Number of attempts: 1 Airway Equipment and Method: Stylet Placement Confirmation: ETT inserted through vocal cords under direct vision, positive ETCO2 and breath sounds checked- equal and bilateral Secured at: 24 cm Tube secured with: Tape Dental Injury: Teeth and Oropharynx as per pre-operative assessment  Comments: Intubation by Francisco Capuchin

## 2022-03-10 NOTE — Anesthesia Preprocedure Evaluation (Addendum)
Anesthesia Evaluation  Patient identified by MRN, date of birth, ID band Patient awake    Reviewed: Allergy & Precautions, H&P , NPO status , Patient's Chart, lab work & pertinent test results  Airway Mallampati: I  TM Distance: >3 FB Neck ROM: Full    Dental no notable dental hx. (+) Teeth Intact, Dental Advisory Given   Pulmonary neg pulmonary ROS,    Pulmonary exam normal breath sounds clear to auscultation       Cardiovascular Exercise Tolerance: Good Pt. on home beta blockers + CAD  negative cardio ROS Normal cardiovascular exam Rhythm:Regular Rate:Normal     Neuro/Psych CVA negative neurological ROS  negative psych ROS   GI/Hepatic negative GI ROS, Neg liver ROS,   Endo/Other  negative endocrine ROS  Renal/GU negative Renal ROS  negative genitourinary   Musculoskeletal negative musculoskeletal ROS (+)   Abdominal   Peds negative pediatric ROS (+)  Hematology negative hematology ROS (+)   Anesthesia Other Findings   Reproductive/Obstetrics negative OB ROS                            Anesthesia Physical Anesthesia Plan  ASA: 2  Anesthesia Plan: General   Post-op Pain Management: Minimal or no pain anticipated   Induction: Intravenous  PONV Risk Score and Plan: 2 and Ondansetron, Treatment may vary due to age or medical condition and Dexamethasone  Airway Management Planned: Oral ETT  Additional Equipment: None  Intra-op Plan:   Post-operative Plan: Extubation in OR  Informed Consent: I have reviewed the patients History and Physical, chart, labs and discussed the procedure including the risks, benefits and alternatives for the proposed anesthesia with the patient or authorized representative who has indicated his/her understanding and acceptance.       Plan Discussed with: Anesthesiologist and CRNA  Anesthesia Plan Comments: (  )        Anesthesia Quick  Evaluation

## 2022-03-11 ENCOUNTER — Encounter (HOSPITAL_COMMUNITY): Payer: Self-pay | Admitting: Cardiology

## 2022-03-19 ENCOUNTER — Encounter: Admitting: Family Medicine

## 2022-04-07 ENCOUNTER — Ambulatory Visit (HOSPITAL_COMMUNITY)
Admission: RE | Admit: 2022-04-07 | Discharge: 2022-04-07 | Disposition: A | Discharge status: Home / Self Care | Payer: Medicare Other | Source: Ambulatory Visit | Attending: Cardiology | Admitting: Cardiology

## 2022-04-07 DIAGNOSIS — I6522 Occlusion and stenosis of left carotid artery: Secondary | ICD-10-CM | POA: Diagnosis present

## 2022-04-07 DIAGNOSIS — I4819 Other persistent atrial fibrillation: Secondary | ICD-10-CM | POA: Insufficient documentation

## 2022-04-07 DIAGNOSIS — I2510 Atherosclerotic heart disease of native coronary artery without angina pectoris: Secondary | ICD-10-CM | POA: Diagnosis present

## 2022-04-07 DIAGNOSIS — Z8673 Personal history of transient ischemic attack (TIA), and cerebral infarction without residual deficits: Secondary | ICD-10-CM | POA: Insufficient documentation

## 2022-04-07 DIAGNOSIS — E7800 Pure hypercholesterolemia, unspecified: Secondary | ICD-10-CM | POA: Insufficient documentation

## 2022-04-08 ENCOUNTER — Ambulatory Visit (HOSPITAL_COMMUNITY): Payer: TRICARE For Life (TFL) | Admitting: Nurse Practitioner

## 2022-04-20 ENCOUNTER — Ambulatory Visit (HOSPITAL_COMMUNITY)
Admission: RE | Admit: 2022-04-20 | Discharge: 2022-04-20 | Disposition: A | Discharge status: Home / Self Care | Payer: Medicare Other | Source: Ambulatory Visit | Attending: Nurse Practitioner | Admitting: Nurse Practitioner

## 2022-04-20 ENCOUNTER — Encounter (HOSPITAL_COMMUNITY): Payer: Self-pay | Admitting: Nurse Practitioner

## 2022-04-20 VITALS — BP 124/72 | HR 45 | Ht 72.0 in | Wt 176.8 lb

## 2022-04-20 DIAGNOSIS — Z8673 Personal history of transient ischemic attack (TIA), and cerebral infarction without residual deficits: Secondary | ICD-10-CM | POA: Insufficient documentation

## 2022-04-20 DIAGNOSIS — Z7901 Long term (current) use of anticoagulants: Secondary | ICD-10-CM | POA: Insufficient documentation

## 2022-04-20 DIAGNOSIS — I4819 Other persistent atrial fibrillation: Secondary | ICD-10-CM | POA: Diagnosis not present

## 2022-04-20 DIAGNOSIS — I4891 Unspecified atrial fibrillation: Secondary | ICD-10-CM | POA: Diagnosis present

## 2022-04-20 DIAGNOSIS — R001 Bradycardia, unspecified: Secondary | ICD-10-CM | POA: Insufficient documentation

## 2022-04-20 DIAGNOSIS — D6869 Other thrombophilia: Secondary | ICD-10-CM

## 2022-04-20 NOTE — Progress Notes (Signed)
Primary Care Physician: Wendie Agreste, MD Referring Physician: Dr. Hosie Poisson Vilardi is a 66 y.o. male with a h/o prior stroke, carotid disease,  afib s/p ablation 03/10/22. He reports that he is doing great. No afib to report, or swallowing or groin difficulties. He is back to his usual activities. He continues with eliquis and multaq.   Today, he denies symptoms of palpitations, chest pain, shortness of breath, orthopnea, PND, lower extremity edema, dizziness, presyncope, syncope, or neurologic sequela. The patient is tolerating medications without difficulties and is otherwise without complaint today.   Past Medical History:  Diagnosis Date   A-fib ([place]) 06/23/2018   Arrhythmia 2013   AFIB   Closed right ankle fracture    Gout    Hypercholesterolemia    Stroke [facility])    Past Surgical History:  Procedure Laterality Date   ATRIAL FIBRILLATION ABLATION N/A 03/10/2022   Procedure: ATRIAL FIBRILLATION ABLATION;  Surgeon: Constance Haw, MD;  Location: [facility];  Service: Cardiovascular;  Laterality: N/A;   broke leg     screws and pins    Current Outpatient Medications  Medication Sig Dispense Refill   Acetylcysteine (N-ACETYL-L-CYSTEINE PO) Take 1 tablet by mouth daily.     apixaban (ELIQUIS) 5 MG TABS tablet TAKE 1 TABLET TWICE A DAY 180 tablet 1   ascorbic acid (VITAMIN C) 500 MG tablet Take 500 mg by mouth daily.     Cholecalciferol (DIALYVITE VITAMIN D 5000) 125 MCG (5000 UT) capsule Take 5,000 Units by mouth daily.     Coenzyme Q10 300 MG CAPS Take 300 mg by mouth daily.     Cyanocobalamin (B-12) 5000 MCG CAPS Take 5,000 mcg by mouth daily.     dronedarone (MULTAQ) 400 MG tablet Take 1 tablet (400 mg total) by mouth 2 (two) times daily with a meal. 180 tablet 3   L-ARGININE PO Take 1 tablet by mouth daily.     metoprolol tartrate (LOPRESSOR) 25 MG tablet Take 25 mg by mouth daily as needed (palpitations).     NIACIN PO Take 640 mg by mouth daily.      OVER THE COUNTER MEDICATION Take 1 tablet by mouth daily. arterial protect supplement     OVER THE COUNTER MEDICATION Take 1 capsule by mouth daily. Super K     predniSONE (DELTASONE) 20 MG tablet Take 40 mg by mouth daily as needed (gout flair up).     No current facility-administered medications for this encounter.    No Known Allergies  Social History   Socioeconomic History   Marital status: Married    Spouse name: Not on file   Number of children: Not on file   Years of education: Not on file   Highest education level: Not on file  Occupational History   Not on file  Tobacco Use   Smoking status: Never   Smokeless tobacco: Never  Vaping Use   Vaping Use: Never used  Substance and Sexual Activity   Alcohol use: Yes    Comment: occasional   Drug use: Never   Sexual activity: Yes    Birth control/protection: None  Other Topics Concern   Not on file  Social History Narrative   Not on file   Social Determinants of Health   Financial Resource Strain: Not on file  Food Insecurity: Not on file  Transportation Needs: Not on file  Physical Activity: Not on file  Stress: Not on file  Social Connections: Not  on file  Intimate Partner Violence: Not on file    Family History  Problem Relation Age of Onset   Early death Mother    COPD Mother    High blood pressure Mother    Anuerysm Mother    Stroke Mother    Heart disease Father    Early death Father    COPD Father    Heart Problems Father        PACER   High blood pressure Father    Obesity Sister        pacer   Aneurysm Sister    High blood pressure Sister     ROS- All systems are reviewed and negative except as per the HPI above  Physical Exam: Vitals:   04/20/22 1000  BP: 124/72  Pulse: (!) 45  Weight: 80.2 kg  Height: 6' (1.829 m)   Wt Readings from Last 3 Encounters:  04/20/22 80.2 kg  03/10/22 78 kg  02/19/22 79.8 kg    Labs: Lab Results  Component Value Date   NA 139 02/19/2022    K 4.8 02/19/2022   CL 101 02/19/2022   CO2 21 02/19/2022   GLUCOSE 90 02/19/2022   BUN 23 02/19/2022   CREATININE 1.20 03/04/2022   CALCIUM 9.4 02/19/2022   MG 2.1 04/07/2021   Lab Results  Component Value Date   INR 1.2 11/15/2021   Lab Results  Component Value Date   CHOL 222 (H) 04/08/2021   HDL 76 04/08/2021   LDLCALC 141 (H) 04/08/2021   TRIG 24 04/08/2021     GEN- The patient is well appearing, alert and oriented x 3 today.   Head- normocephalic, atraumatic Eyes-  Sclera clear, conjunctiva pink Ears- hearing intact Oropharynx- clear Neck- supple, no JVP Lymph- no cervical lymphadenopathy Lungs- Clear to ausculation bilaterally, normal work of breathing Heart- Regular rate and rhythm, no murmurs, rubs or gallops, PMI not laterally displaced GI- soft, NT, ND, + BS Extremities- no clubbing, cyanosis, or edema MS- no significant deformity or atrophy Skin- no rash or lesion Psych- euthymic mood, full affect Neuro- strength and sensation are intact  EKG-Vent. rate 45 BPM PR interval 204 ms QRS duration 162 ms QT/QTcB 534/461 ms P-R-T axes 71 -56 43 Sinus bradycardia Right bundle branch block Left anterior fascicular block  Bifascicular block  Minimal voltage criteria for LVH, may be normal variant ( R in aVL ) Abnormal ECG When compared with ECG of 10-Mar-2022 14:42, PREVIOUS ECG IS PRESENT    Assessment and Plan:  1. Afib  S/p ablation and doing well staying in SR  Continue Multaq 400 mg bid   2. CHA2DS2VASc  score of at least 5 Continue eliquis 5 mg bid   Reminded not to interrupt anticoagulation   F/u with Dr. Elberta Fortis 12/12   Elvina Sidle. [facility] [address] [place], [place] [address] [phone]

## 2022-04-21 ENCOUNTER — Telehealth: Payer: Self-pay

## 2022-04-21 DIAGNOSIS — Z125 Encounter for screening for malignant neoplasm of prostate: Secondary | ICD-10-CM

## 2022-04-21 DIAGNOSIS — E7800 Pure hypercholesterolemia, unspecified: Secondary | ICD-10-CM

## 2022-04-21 DIAGNOSIS — Z0000 Encounter for general adult medical examination without abnormal findings: Secondary | ICD-10-CM

## 2022-04-21 DIAGNOSIS — Z131 Encounter for screening for diabetes mellitus: Secondary | ICD-10-CM

## 2022-04-21 NOTE — Telephone Encounter (Signed)
Ordered labs to be done prior to physical

## 2022-04-27 ENCOUNTER — Other Ambulatory Visit (INDEPENDENT_AMBULATORY_CARE_PROVIDER_SITE_OTHER): Payer: Medicare Other

## 2022-04-27 DIAGNOSIS — Z0000 Encounter for general adult medical examination without abnormal findings: Secondary | ICD-10-CM | POA: Diagnosis not present

## 2022-04-27 DIAGNOSIS — Z125 Encounter for screening for malignant neoplasm of prostate: Secondary | ICD-10-CM

## 2022-04-27 DIAGNOSIS — E7800 Pure hypercholesterolemia, unspecified: Secondary | ICD-10-CM | POA: Diagnosis not present

## 2022-04-27 LAB — COMPREHENSIVE METABOLIC PANEL
ALT: 17 U/L (ref 0–53)
AST: 20 U/L (ref 0–37)
Albumin: 4.6 g/dL (ref 3.5–5.2)
Alkaline Phosphatase: 52 U/L (ref 39–117)
BUN: 17 mg/dL (ref 6–23)
CO2: 27 mEq/L (ref 19–32)
Calcium: 9.5 mg/dL (ref 8.4–10.5)
Chloride: 102 mEq/L (ref 96–112)
Creatinine, Ser: 1 mg/dL (ref 0.40–1.50)
GFR: 78.83 mL/min (ref >60.00)
Glucose, Bld: 83 mg/dL (ref 70–99)
Potassium: 5.4 mEq/L — ABNORMAL HIGH (ref 3.5–5.1)
Sodium: 136 mEq/L (ref 135–145)
Total Bilirubin: 0.8 mg/dL (ref 0.2–1.2)
Total Protein: 7.1 g/dL (ref 6.0–8.3)

## 2022-04-27 LAB — LIPID PANEL
Cholesterol: 226 mg/dL — ABNORMAL HIGH (ref 0–200)
HDL: 65.7 mg/dL (ref >39.00)
LDL Cholesterol: 150 mg/dL — ABNORMAL HIGH (ref 0–99)
NonHDL: 159.83
Total CHOL/HDL Ratio: 3
Triglycerides: 49 mg/dL (ref 0.0–149.0)
VLDL: 9.8 mg/dL (ref 0.0–40.0)

## 2022-04-27 LAB — PSA, MEDICARE: PSA: 1.2 ng/ml (ref 0.10–4.00)

## 2022-05-03 ENCOUNTER — Encounter: Payer: Self-pay | Admitting: Family Medicine

## 2022-05-03 ENCOUNTER — Ambulatory Visit (INDEPENDENT_AMBULATORY_CARE_PROVIDER_SITE_OTHER): Payer: Medicare Other | Admitting: Family Medicine

## 2022-05-03 VITALS — BP 118/70 | HR 57 | Temp 98.6°F | Ht 71.0 in | Wt 177.8 lb

## 2022-05-03 DIAGNOSIS — I6522 Occlusion and stenosis of left carotid artery: Secondary | ICD-10-CM

## 2022-05-03 DIAGNOSIS — M10072 Idiopathic gout, left ankle and foot: Secondary | ICD-10-CM | POA: Diagnosis not present

## 2022-05-03 DIAGNOSIS — Z8673 Personal history of transient ischemic attack (TIA), and cerebral infarction without residual deficits: Secondary | ICD-10-CM | POA: Diagnosis not present

## 2022-05-03 DIAGNOSIS — E7800 Pure hypercholesterolemia, unspecified: Secondary | ICD-10-CM

## 2022-05-03 DIAGNOSIS — I4891 Unspecified atrial fibrillation: Secondary | ICD-10-CM

## 2022-05-03 DIAGNOSIS — E875 Hyperkalemia: Secondary | ICD-10-CM

## 2022-05-03 NOTE — Patient Instructions (Addendum)
Follow up as scheduled with Dr. Johnsie Cancel regarding your cholesterol and treatment options.  If gout flares let me know.  Recheck in 1 year. Take care.

## 2022-05-03 NOTE — Progress Notes (Unsigned)
Subjective:  Patient ID: Levi Davis, male    DOB: Jun 28, 1956  Age: 66 y.o. MRN: [id number]  CC:  Chief Complaint  Patient presents with   Gout    HPI Levi Davis presents for follow-up, establish care visit was in January.  Gout: Last flare: November 2022 with no known cause at that time.  Triggers discussed. Daily meds: None currently, option to restart allopurinol if frequent flares.  Had been doing well off allopurinol with diet changes and had gone years without gout flare. No recent flare.  Prn med: Colchicine, prednisone if needed. No results found for: "LABURIC"  History of TIA October 2022.  Chronic left internal carotid artery occlusion, eval by vascular in November 2022, either congenital or from dissection.  Plan for surveillance with annual duplex, continued on Eliquis.  No new bleeding. [place] on 10/11 - stable, repeat in 1 year.  A-fib ablation 03/10/2022.  Cardiology follow-up note 04/20/2022 reviewed, no recurrence.  Sinus rhythm.  Continued on Eliquis and Multaq 400 mg twice daily. Still feels well.   Cardiac A-fib, CAD.Marland Kitchen  Cardiologist Dr. Johnsie Cancel, electrophysiologist Dr. Curt Bears.  Coronary calcium score November 2022 of 261 with involvement of the LAD, RCA.  Has declined statin, and MR testing previously with Dr. Johnsie Cancel Recent labs noted - mild hyperkalemia. 5.4 on 10/31.   Health maintenance Colon CA screening: due next year. Hx of polyp. Will wait until next year to schedule.  Prior Hep C screening by prior PCP. Negative.   Immunization History  Administered Date(s) Administered   Tdap 11/15/2021  Declines flu vaccine, shingles, covid and pneumonia and RSV vaccines.   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   Atrial fibrillation with RVR ([place]) 06/23/2018   Hypercholesterolemia 06/23/2018   Closed right ankle fracture 06/23/2018   Past Medical History:  Diagnosis Date   A-fib ([place]) 06/23/2018   Arrhythmia 2013   AFIB   Closed right ankle fracture    Gout    Hypercholesterolemia    Stroke [facility])    Past Surgical History:  Procedure Laterality Date   ATRIAL FIBRILLATION ABLATION N/A 03/10/2022   Procedure: ATRIAL FIBRILLATION ABLATION;  Surgeon: Constance Haw, MD;  Location: [facility];  Service: Cardiovascular;  Laterality: N/A;   broke leg     screws and pins   No Known Allergies Prior to Admission medications   Medication Sig Start Date End Date Taking? Authorizing Provider  Acetylcysteine (N-ACETYL-L-CYSTEINE PO) Take 1 tablet by mouth daily.   Yes [provider]  apixaban (ELIQUIS) 5 MG TABS tablet TAKE 1 TABLET TWICE A DAY 11/20/21  Yes Camnitz, Will Hassell Done, MD  ascorbic acid (VITAMIN C) 500 MG tablet Take 500 mg by mouth daily.   Yes [provider]  Cholecalciferol (DIALYVITE VITAMIN D 5000) 125 MCG (5000 UT) capsule Take 5,000 Units by mouth daily.   Yes [provider]  Coenzyme Q10 300 MG CAPS Take 300 mg by mouth daily.   Yes [provider]  Cyanocobalamin (B-12) 5000 MCG CAPS Take 5,000 mcg by mouth daily.   Yes [provider]  dronedarone (MULTAQ) 400 MG tablet Take 1 tablet (400 mg total) by mouth 2 (two) times daily  with a meal. 06/01/21 06/01/22 Yes Josue Hector, MD  L-ARGININE PO Take 1 tablet by mouth daily.   Yes [provider]  metoprolol tartrate (LOPRESSOR) 25 MG tablet Take 25 mg by mouth daily as needed (palpitations). 01/15/22  Yes [provider]  NIACIN PO Take 640 mg by mouth daily.   Yes [provider]  OVER THE COUNTER MEDICATION Take 1 tablet by mouth daily. arterial protect supplement   Yes [provider]  OVER THE COUNTER MEDICATION Take 1 capsule by mouth daily. Super K   Yes [provider]  predniSONE (DELTASONE) 20 MG tablet Take 40 mg by mouth daily as needed (gout flair up).   Yes [provider]   Social History   Socioeconomic History   Marital status: Married    Spouse name: Not on file   Number of children: Not on file   Years of education: Not on file   Highest education level: Not on file  Occupational History   Not on file  Tobacco Use   Smoking status: Never   Smokeless tobacco: Never  Vaping Use   Vaping Use: Never used  Substance and Sexual Activity   Alcohol use: Yes    Comment: occasional   Drug use: Never   Sexual activity: Yes    Birth control/protection: None  Other Topics Concern   Not on file  Social History Narrative   Not on file   Social Determinants of Health   Financial Resource Strain: Not on file  Food Insecurity: Not on file  Transportation Needs: Not on file  Physical Activity: Not on file  Stress: Not on file  Social Connections: Not on file  Intimate Partner Violence: Not on file    Review of Systems Per HPI  Objective:   Vitals:   05/03/22 1254  BP: 118/70  Pulse: (!) 57  Temp: 98.6 F (37 C)  SpO2: 97%  Weight: 177 lb 12.8 oz (80.6 kg)  Height: 5\' 11"  (1.803 m)     Physical Exam Vitals reviewed.  Constitutional:      Appearance: He is well-developed.  HENT:     Head: Normocephalic and atraumatic.  Neck:     Vascular: No carotid bruit or JVD.  Cardiovascular:     Rate and Rhythm: Normal rate and regular rhythm.     Heart sounds: Normal heart sounds. No murmur heard. Pulmonary:     Effort: Pulmonary effort is normal.     Breath sounds: Normal breath sounds. No rales.  Musculoskeletal:     Right lower leg: No edema.     Left lower leg: No edema.  Skin:    General: Skin is warm and dry.  Neurological:     Mental Status: He is alert and oriented to person, place, and time.   Psychiatric:        Mood and Affect: Mood normal.        Assessment & Plan:  Levi Davis is a 66 y.o. male . No diagnosis found.   No orders of the defined types were placed in this encounter.  There are no Patient Instructions on file for this visit.    Signed,   Merri Ray, MD [facility] 05/03/22 1:13 PM

## 2022-05-04 ENCOUNTER — Encounter: Payer: Self-pay | Admitting: Family Medicine

## 2022-05-04 LAB — BASIC METABOLIC PANEL
BUN: 18 mg/dL (ref 6–23)
CO2: 29 mEq/L (ref 19–32)
Calcium: 9.5 mg/dL (ref 8.4–10.5)
Chloride: 102 mEq/L (ref 96–112)
Creatinine, Ser: 1.05 mg/dL (ref 0.40–1.50)
GFR: 74.34 mL/min (ref >60.00)
Glucose, Bld: 82 mg/dL (ref 70–99)
Potassium: 4.8 mEq/L (ref 3.5–5.1)
Sodium: 139 mEq/L (ref 135–145)

## 2022-06-05 ENCOUNTER — Other Ambulatory Visit: Payer: Self-pay | Admitting: Cardiovascular Disease

## 2022-06-05 ENCOUNTER — Other Ambulatory Visit: Payer: Self-pay | Admitting: Physician Assistant

## 2022-06-05 ENCOUNTER — Other Ambulatory Visit: Payer: Self-pay | Admitting: Cardiology

## 2022-06-05 DIAGNOSIS — Z86718 Personal history of other venous thrombosis and embolism: Secondary | ICD-10-CM | POA: Insufficient documentation

## 2022-06-05 DIAGNOSIS — I639 Cerebral infarction, unspecified: Secondary | ICD-10-CM | POA: Insufficient documentation

## 2022-06-05 DIAGNOSIS — I4891 Unspecified atrial fibrillation: Secondary | ICD-10-CM

## 2022-06-05 MED ORDER — APIXABAN 5 MG PO TABS: 5.0000 mg | ORAL_TABLET | Freq: Two times a day (BID) | ORAL | 1 refills | Status: DC

## 2022-06-05 NOTE — Progress Notes (Signed)
Pt leaving on a trip next week, no time to get mail order refill. Pt prefers [facility] in [place]. Rx sent in.  Theodore Demark, PA-C 06/05/2022 8:59 AM

## 2022-06-07 ENCOUNTER — Other Ambulatory Visit: Payer: Self-pay | Admitting: *Deleted

## 2022-06-07 ENCOUNTER — Telehealth: Payer: Self-pay | Admitting: Family Medicine

## 2022-06-07 DIAGNOSIS — I4891 Unspecified atrial fibrillation: Secondary | ICD-10-CM

## 2022-06-07 MED ORDER — MULTAQ 400 MG PO TABS: 400.0000 mg | ORAL_TABLET | Freq: Two times a day (BID) | ORAL | 0 refills | Status: DC

## 2022-06-07 NOTE — Telephone Encounter (Signed)
Chart reviewed, diagnosed with sinusitis at E-visit 2 days ago.  Prescribed doxycycline, and that may take more than just a few days to start showing signs of improvement.  Would not recommend any additional medication for now, but that is without seeing him.  If he feels that he is getting worse or not improving would recommend in person visit with any provider.  Thanks

## 2022-06-07 NOTE — Telephone Encounter (Signed)
Eliquis 5mg  refill request received. Patient is 66 years old, weight-80.6kg, Crea-1.05 on 05/03/2022, Diagnosis-Afib, and last seen by 13/11/2021 on 04/20/2022. Dose is appropriate based on dosing criteria. Will send in refill to requested pharmacy.

## 2022-06-07 NOTE — Telephone Encounter (Signed)
Eliquis 5mg  refill request received. Patient is 66 years old, weight-80.6kg, Crea-1.05 on 05/03/2022, Diagnosis-Afib, and last seen by 13/11/2021 on 04/20/2022. Dose is appropriate based on dosing criteria.  Refill with 180 tabs and 1 refill already sent today.

## 2022-06-07 NOTE — Telephone Encounter (Signed)
Patient did an evisit on Saturday and was sent in Doxycycline but he is still not feeling his best and he is traveling on Wednesday. He wanted to see if Dr Neva Seat could send him in something different. I let him know he may not be able to since he did not see him but that I would ask and give him a call back as soon as we had a response.

## 2022-06-07 NOTE — Telephone Encounter (Signed)
Pt was upset stating that he has to travel Wednesday and I offered an appt tomorrow and patient said "I guess this is just my problem I'll have to travel sick" I apologized there wasn't anything we could do this instant for him but again advised him it takes time to feel better after starting a med and he just hung up

## 2022-06-08 ENCOUNTER — Ambulatory Visit: Payer: Medicare Other | Attending: Cardiology | Admitting: Cardiology

## 2022-06-08 ENCOUNTER — Encounter: Payer: Self-pay | Admitting: Cardiology

## 2022-06-08 VITALS — BP 140/72 | HR 75 | Ht 71.0 in | Wt 178.6 lb

## 2022-06-08 DIAGNOSIS — I6522 Occlusion and stenosis of left carotid artery: Secondary | ICD-10-CM

## 2022-06-08 DIAGNOSIS — D6869 Other thrombophilia: Secondary | ICD-10-CM

## 2022-06-08 DIAGNOSIS — I480 Paroxysmal atrial fibrillation: Secondary | ICD-10-CM

## 2022-06-08 MED ORDER — APIXABAN 5 MG PO TABS: 5.0000 mg | ORAL_TABLET | Freq: Two times a day (BID) | ORAL | 11 refills | Status: DC

## 2022-06-08 NOTE — Patient Instructions (Signed)
Medication Instructions:  Your physician has recommended you make the following change in your medication:  STOP Multaq  *If you need a refill on your cardiac medications before your next appointment, please call your pharmacy*   Lab Work: None ordered   Testing/Procedures: None ordered   Follow-Up: At [facility], you and your health needs are our priority.  As part of our continuing mission to provide you with exceptional heart care, we have created designated Provider Care Teams.  These Care Teams include your primary Cardiologist (physician) and Advanced Practice Providers (APPs -  Physician Assistants and Nurse Practitioners) who all work together to provide you with the care you need, when you need it.  Your next appointment:   6 month(s)  The format for your next appointment:   In Person  Provider:   Loman Brooklyn, MD    Thank you for choosing [facility]!!   Dory Horn, RN [phone]  Other Instructions   Important Information About Sugar

## 2022-06-08 NOTE — Progress Notes (Signed)
Electrophysiology Office Note   Date:  06/08/2022   ID:  Levi Davis, DOB Apr 05, 1956, MRN [id number]  PCP:  Shade Flood, MD  Cardiologist:  Junious Dresser Primary Electrophysiologist:  Regan Lemming, MD    Chief Complaint: AF   History of Present Illness: Levi Davis is a 65 y.o. male who is being seen today for the evaluation of AF at the request of Shade Flood, MD. Presenting today for electrophysiology evaluation.  He has a history significant for atrial fibrillation.  He also has hyperlipidemia.  He had a posterior circulation TIA 04/07/2021.  He was found to be in atrial fibrillation on presentation to emergency room.  He has a chronic left ICA occlusion with silent left-sided watershed infarcts on MRI.  He is post atrial fibrillation ablation 03/10/2022.  Today, denies symptoms of palpitations, chest pain, shortness of breath, orthopnea, PND, lower extremity edema, claudication, dizziness, presyncope, syncope, bleeding, or neurologic sequela. The patient is tolerating medications without difficulties.  Since being seen he has done well.  He has had no chest pain or shortness of breath.  He is noted no further episodes of atrial fibrillation.  He is able to do all of his daily activities.  He states that he is more efficient exercising and feels much improved.    Past Medical History:  Diagnosis Date   A-fib ([facility]) 06/23/2018   Arrhythmia 2013   AFIB   Closed right ankle fracture    Gout    Hypercholesterolemia    Stroke [facility])    Past Surgical History:  Procedure Laterality Date   ATRIAL FIBRILLATION ABLATION N/A 03/10/2022   Procedure: ATRIAL FIBRILLATION ABLATION;  Surgeon: Regan Lemming, MD;  Location: [facility];  Service: Cardiovascular;  Laterality: N/A;   broke leg     screws and pins     Current Outpatient Medications  Medication Sig Dispense Refill   Acetylcysteine (N-ACETYL-L-CYSTEINE PO) Take 1 tablet by mouth daily.      apixaban (ELIQUIS) 5 MG TABS tablet Take 1 tablet (5 mg total) by mouth 2 (two) times daily. 60 tablet 11   ascorbic acid (VITAMIN C) 500 MG tablet Take 500 mg by mouth daily.     Cholecalciferol (DIALYVITE VITAMIN D 5000) 125 MCG (5000 UT) capsule Take 5,000 Units by mouth daily.     Coenzyme Q10 300 MG CAPS Take 300 mg by mouth daily.     Cyanocobalamin (B-12) 5000 MCG CAPS Take 5,000 mcg by mouth daily.     doxycycline (VIBRAMYCIN) 100 MG capsule Take 100 mg by mouth 2 (two) times daily.     L-ARGININE PO Take 1 tablet by mouth daily.     metoprolol tartrate (LOPRESSOR) 25 MG tablet Take 25 mg by mouth daily as needed (palpitations).     NIACIN PO Take 640 mg by mouth daily.     OVER THE COUNTER MEDICATION Take 1 tablet by mouth daily. arterial protect supplement     OVER THE COUNTER MEDICATION Take 1 capsule by mouth daily. Super K     predniSONE (DELTASONE) 20 MG tablet Take 40 mg by mouth daily as needed (gout flair up).     No current facility-administered medications for this visit.    Allergies:   Patient has no known allergies.   Social History:  The patient  reports that he has never smoked. He has never used smokeless tobacco. He reports current alcohol use. He reports that he does not use drugs.  Family History:  The patient's family history includes Aneurysm in his sister; Anuerysm in his mother; COPD in his father and mother; Early death in his father and mother; Heart Problems in his father; Heart disease in his father; High blood pressure in his father, mother, and sister; Obesity in his sister; Stroke in his mother.   ROS:  Please see the history of present illness.   Otherwise, review of systems is positive for none.   All other systems are reviewed and negative.   PHYSICAL EXAM: VS:  BP (!) 140/72   Pulse 75   Ht 5\' 11"  (1.803 m)   Wt 178 lb 9.6 oz (81 kg)   SpO2 98%   BMI 24.91 kg/m  , BMI Body mass index is 24.91 kg/m. GEN: Well nourished, well developed, in no  acute distress  HEENT: normal  Neck: no JVD, carotid bruits, or masses Cardiac: RRR; no murmurs, rubs, or gallops,no edema  Respiratory:  clear to auscultation bilaterally, normal work of breathing GI: soft, nontender, nondistended, + BS MS: no deformity or atrophy  Skin: warm and dry Neuro:  Strength and sensation are intact Psych: euthymic mood, full affect  EKG:  EKG is ordered today. Personal review of the ekg ordered shows sinus rhythm, right bundle branch block, left anterior fascicular block  Recent Labs: 02/19/2022: Hemoglobin 14.4; Platelets 247 04/27/2022: ALT 17 05/03/2022: BUN 18; Creatinine, Ser 1.05; Potassium 4.8; Sodium 139    Lipid Panel     Component Value Date/Time   CHOL 226 (H) 04/27/2022 1256   TRIG 49.0 04/27/2022 1256   HDL 65.70 04/27/2022 1256   CHOLHDL 3 04/27/2022 1256   VLDL 9.8 04/27/2022 1256   LDLCALC 150 (H) 04/27/2022 1256     Wt Readings from Last 3 Encounters:  06/08/22 178 lb 9.6 oz (81 kg)  05/03/22 177 lb 12.8 oz (80.6 kg)  04/20/22 176 lb 12.8 oz (80.2 kg)      Other studies Reviewed: Additional studies/ records that were reviewed today include: TTE 04/08/21  Review of the above records today demonstrates:   1. No bubble study performed.   2. Abnormal septal motion. Left ventricular ejection fraction, by  estimation, is 55 to 60%. The left ventricle has normal function. The left  ventricle has no regional wall motion abnormalities. Left ventricular  diastolic parameters were normal.   3. Right ventricular systolic function is normal. The right ventricular  size is normal.   4. The mitral valve is normal in structure. No evidence of mitral valve  regurgitation. No evidence of mitral stenosis.   5. The aortic valve is normal in structure. Aortic valve regurgitation is  not visualized. No aortic stenosis is present.   6. The inferior vena cava is normal in size with greater than 50%  respiratory variability, suggesting right  atrial pressure of 3 mmHg.    ASSESSMENT AND PLAN:  1.  Paroxysmal atrial fibrillation: Currently on Multaq 400 mg twice daily, Eliquis 5 mg twice daily.  CHA2DS2-VASc of 3.  Is post atrial fibrillation ablation 03/10/2022.  In sinus rhythm.  Levi Davis stop her Multaq.  2.  Cryptogenic stroke: Currently on Eliquis for atrial fibrillation  3.  Hyperlipidemia: Continue statin per primary cardiology  4.  Secondary hypercoagulable state: Currently on Eliquis for atrial fibrillation as above   Current medicines are reviewed at length with the patient today.   The patient does not have concerns regarding his medicines.  The following changes were made today: Stop Multaq  Labs/ tests ordered today include:  Orders Placed This Encounter  Procedures   EKG 12-Lead     Disposition:   FU 6 months  Signed, Mikaylee Arseneau Jorja Loa, MD  06/08/2022 3:09 PM     [facility] [address] [place] [address] [phone] (office) [phone] (fax)

## 2022-07-08 NOTE — Progress Notes (Signed)
Date:  07/16/2022   ID:  Levi Davis, DOB 1955-10-07, MRN [id number]  Patient Location: Other:  office in [place] Provider Location: Office  PCP:  Wendie Agreste, MD  Cardiologist:  Jenkins Rouge, MD  Electrophysiologist:  Constance Haw, MD   Evaluation Performed:  Follow-Up Visit  Chief Complaint:  PAF, HLD  History of Present Illness:     67 y.o. with history of HLD, PAF. CHADVASC 3 on eliquis  Post ablation with Dr Curt Bears 03/10/22 Multaq d/c   Calcium score 62 , 47 th percentile done during PV CTA prior to ablation 03/04/22   Has refused statin/other Rx despite high score and stroke Was sent home October 2022 with script for Lipitor 80 mg but did not fill  Most recent LDL 150 04/27/22 Prior NMR 01/01/22 with elevated particle number [id number] as well     04/09/21 admitted with stroke visual disturbance and right hand clumsiness. 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 Has a daughter in [place] expecting 2 nd grand child and son in up state [place]  Again declines Rx for cholesterol Seems to worry most about cognitive issues    Past Medical History:  Diagnosis Date   A-fib ([place]) 06/23/2018   Arrhythmia 2013   AFIB   Closed right ankle fracture    Gout    Hypercholesterolemia    Stroke [facility])    Past Surgical History:  Procedure Laterality Date   ATRIAL FIBRILLATION ABLATION N/A 03/10/2022   Procedure: ATRIAL FIBRILLATION ABLATION;  Surgeon: Constance Haw, MD;  Location: [facility] [facility];  Service: Cardiovascular;  Laterality: N/A;   broke leg     screws and pins     Current Meds  Medication Sig   Acetylcysteine (N-ACETYL-L-CYSTEINE PO) Take 1 tablet by mouth daily.    apixaban (ELIQUIS) 5 MG TABS tablet Take 1 tablet (5 mg total) by mouth 2 (two) times daily.   ascorbic acid (VITAMIN C) 500 MG tablet Take 500 mg by mouth daily.   Cholecalciferol (DIALYVITE VITAMIN D 5000) 125 MCG (5000 UT) capsule Take 5,000 Units by mouth daily.   Coenzyme Q10 300 MG CAPS Take 300 mg by mouth daily.   Cyanocobalamin (B-12) 5000 MCG CAPS Take 5,000 mcg by mouth daily.   L-ARGININE PO Take 1 tablet by mouth daily.   NIACIN PO Take 640 mg by mouth daily.   OVER THE COUNTER MEDICATION Take 1 tablet by mouth daily. arterial protect supplement   OVER THE COUNTER MEDICATION Take 1 capsule by mouth daily. Super K   predniSONE (DELTASONE) 20 MG tablet Take 40 mg by mouth daily as needed (gout flair up).     Allergies:   Patient has no known allergies.   Social History   Tobacco Use   Smoking status: Never   Smokeless tobacco: Never  Vaping Use   Vaping Use: Never used  Substance Use Topics   Alcohol use: Yes    Comment: occasional   Drug use: Never     Family Hx: The patient's family history includes Aneurysm in his sister; Anuerysm in his mother; COPD in his father and mother; Early death in his father and mother; Heart Problems in  his father; Heart disease in his father; High blood pressure in his father, mother, and sister; Obesity in his sister; Stroke in his mother.  ROS:   Please see the history of present illness.    General:no colds or fevers, no weight changes Skin:no rashes or ulcers HEENT:no blurred vision, no congestion CV:see HPI PUL:see HPI GI:no diarrhea constipation or melena, no indigestion GU:no hematuria, no dysuria MS:no joint pain, no claudication Neuro:no syncope, no lightheadedness Endo:no diabetes, no thyroid disease  All other systems reviewed and are negative.   Prior CV studies:   The following studies were reviewed today:  Ca score 05/11/21 IMPRESSION: Coronary calcium score of 261. This was 23 th percentile for age-, race-,  and sex-matched controls.  TTE  04/08/21  EF 60-65%  No valve disease Mild LAE 4.2 cm     Labs/Other Tests and Data Reviewed:    EKG:  SR rate 59 LAD/LVH LAE 06/26/18   Recent Labs: 02/19/2022: Hemoglobin 14.4; Platelets 247 04/27/2022: ALT 17 05/03/2022: BUN 18; Creatinine, Ser 1.05; Potassium 4.8; Sodium 139   Recent Lipid Panel Lab Results  Component Value Date/Time   CHOL 226 (H) 04/27/2022 12:56 PM   TRIG 49.0 04/27/2022 12:56 PM   HDL 65.70 04/27/2022 12:56 PM   CHOLHDL 3 04/27/2022 12:56 PM   LDLCALC 150 (H) 04/27/2022 12:56 PM    Wt Readings from Last 3 Encounters:  07/16/22 173 lb 9.6 oz (78.7 kg)  06/08/22 178 lb 9.6 oz (81 kg)  05/03/22 177 lb 12.8 oz (80.6 kg)     Objective:    Vital Signs:  BP 110/66   Pulse 66   Ht 6' (1.829 m)   Wt 173 lb 9.6 oz (78.7 kg)   SpO2 99%   BMI 23.54 kg/m    Affect appropriate Healthy:  appears stated age HEENT: normal Neck supple with no adenopathy JVP normal no bruits no thyromegaly Lungs clear with no wheezing and good diaphragmatic motion Heart:  S1/S2 no murmur, no rub, gallop or click PMI normal Abdomen: benighn, BS positve, no tenderness, no AAA no bruit.  No HSM or HJR Distal pulses intact with no bruits No edema Neuro non-focal Skin warm and dry No muscular weakness    ASSESSMENT & PLAN:    PAF  post afib ablation 03/10/22 Multaq d/c on eliquis for CHADVASC 3 with previus stoke F/U Camnitz  HLD  has refused Rx with statin or other drugs discussed at length again CAD-subclinical calcium score 62 , 47 th percentile No chest pain  Carotid:  left ICA occlusion known Duplex 04/07/22 no significant right ICA dx Repeat 03/2023    Medication Adjustments/Labs and Tests Ordered: Current medicines are reviewed at length with the patient today.  Concerns regarding medicines are outlined above.   Tests Ordered: No orders of the defined types were placed in this encounter.  Carotid duplex 03/2023    Medication Changes: No orders of the defined types were placed in this encounter.   Follow Up:   1 year   Signed, Jenkins Rouge, MD  07/16/2022 8:36 AM    [place]

## 2022-07-16 ENCOUNTER — Encounter: Payer: Self-pay | Admitting: Cardiovascular Disease

## 2022-07-16 ENCOUNTER — Ambulatory Visit: Payer: Medicare Other | Attending: Cardiovascular Disease | Admitting: Cardiovascular Disease

## 2022-07-16 VITALS — BP 110/66 | HR 66 | Ht 72.0 in | Wt 173.6 lb

## 2022-07-16 DIAGNOSIS — E7800 Pure hypercholesterolemia, unspecified: Secondary | ICD-10-CM

## 2022-07-16 DIAGNOSIS — I2510 Atherosclerotic heart disease of native coronary artery without angina pectoris: Secondary | ICD-10-CM | POA: Insufficient documentation

## 2022-07-16 DIAGNOSIS — I480 Paroxysmal atrial fibrillation: Secondary | ICD-10-CM | POA: Insufficient documentation

## 2022-07-16 NOTE — Patient Instructions (Signed)
Medication Instructions:  Your physician recommends that you continue on your current medications as directed. Please refer to the Current Medication list given to you today.  *If you need a refill on your cardiac medications before your next appointment, please call your pharmacy*  Lab Work: If you have labs (blood work) drawn today and your tests are completely normal, you will receive your results only by: MyChart Message (if you have MyChart) OR A paper copy in the mail If you have any lab test that is abnormal or we need to change your treatment, we will call you to review the results.  Testing/Procedures: None ordered today.  Follow-Up: At [facility], you and your health needs are our priority.  As part of our continuing mission to provide you with exceptional heart care, we have created designated Provider Care Teams.  These Care Teams include your primary Cardiologist (physician) and Advanced Practice Providers (APPs -  Physician Assistants and Nurse Practitioners) who all work together to provide you with the care you need, when you need it.  We recommend signing up for the patient portal called "MyChart".  Sign up information is provided on this After Visit Summary.  MyChart is used to connect with patients for Virtual Visits (Telemedicine).  Patients are able to view lab/test results, encounter notes, upcoming appointments, etc.  Non-urgent messages can be sent to your provider as well.   To learn more about what you can do with MyChart, go to [email or web address].    Your next appointment:   12 month(s)  Provider:   Peter Nishan, MD     

## 2022-08-05 ENCOUNTER — Encounter (HOSPITAL_COMMUNITY): Payer: Self-pay | Admitting: *Deleted

## 2022-08-07 ENCOUNTER — Telehealth: Payer: Medicare Other | Admitting: Physician Assistant

## 2022-08-07 ENCOUNTER — Telehealth: Payer: Medicare Other

## 2022-08-07 DIAGNOSIS — M109 Gout, unspecified: Secondary | ICD-10-CM

## 2022-08-07 MED ORDER — COLCHICINE 0.6 MG PO TABS: 0.6000 mg | ORAL_TABLET | Freq: Every day | ORAL | 0 refills | Status: DC

## 2022-08-07 MED ORDER — PREDNISONE 20 MG PO TABS: 40.0000 mg | ORAL_TABLET | Freq: Every day | ORAL | 0 refills | Status: AC

## 2022-08-07 NOTE — Patient Instructions (Signed)
Levi Davis, thank you for joining [place], PA-C for today's virtual visit.  While this provider is not your primary care provider (PCP), if your PCP is located in our provider database this encounter information will be shared with them immediately following your visit.   [facility] account gives you access to today's visit and all your visits, tests, and labs performed at [facility] " click here if you don't have a [facility] account or go to [email or web address]  Consent: (Patient) Levi Davis provided verbal consent for this virtual visit at the beginning of the encounter.  Current Medications:  Current Outpatient Medications:    colchicine 0.6 MG tablet, Take 1 tablet (0.6 mg total) by mouth daily. Start with 2 tabs (1.2 mg) once then 1 tab every 12 hours after, Disp: 60 tablet, Rfl: 0   predniSONE (DELTASONE) 20 MG tablet, Take 2 tablets (40 mg total) by mouth daily with breakfast for 7 days., Disp: 14 tablet, Rfl: 0   Acetylcysteine (N-ACETYL-L-CYSTEINE PO), Take 1 tablet by mouth daily., Disp: , Rfl:    apixaban (ELIQUIS) 5 MG TABS tablet, Take 1 tablet (5 mg total) by mouth 2 (two) times daily., Disp: 60 tablet, Rfl: 11   ascorbic acid (VITAMIN C) 500 MG tablet, Take 500 mg by mouth daily., Disp: , Rfl:    Cholecalciferol (DIALYVITE VITAMIN D 5000) 125 MCG (5000 UT) capsule, Take 5,000 Units by mouth daily., Disp: , Rfl:    Coenzyme Q10 300 MG CAPS, Take 300 mg by mouth daily., Disp: , Rfl:    Cyanocobalamin (B-12) 5000 MCG CAPS, Take 5,000 mcg by mouth daily., Disp: , Rfl:    doxycycline (VIBRAMYCIN) 100 MG capsule, Take 100 mg by mouth 2 (two) times daily., Disp: , Rfl:    L-ARGININE PO, Take 1 tablet by mouth daily., Disp: , Rfl:    metoprolol tartrate (LOPRESSOR) 25 MG tablet, Take 25 mg by mouth daily as needed (palpitations)., Disp: , Rfl:    NIACIN PO, Take 640 mg by mouth daily., Disp: , Rfl:    OVER THE COUNTER MEDICATION, Take  1 tablet by mouth daily. arterial protect supplement, Disp: , Rfl:    OVER THE COUNTER MEDICATION, Take 1 capsule by mouth daily. Super K, Disp: , Rfl:    Medications ordered in this encounter:  Meds ordered this encounter  Medications   predniSONE (DELTASONE) 20 MG tablet    Sig: Take 2 tablets (40 mg total) by mouth daily with breakfast for 7 days.    Dispense:  14 tablet    Refill:  0    Order Specific Question:   Supervising Provider    Answer:   Chase Picket [id number]   colchicine 0.6 MG tablet    Sig: Take 1 tablet (0.6 mg total) by mouth daily. Start with 2 tabs (1.2 mg) once then 1 tab every 12 hours after    Dispense:  60 tablet    Refill:  0    Order Specific Question:   Supervising Provider    Answer:   Chase Picket [id number]     *If you need refills on other medications prior to your next appointment, please contact your pharmacy*  Follow-Up: Call back or seek an in-person evaluation if the symptoms worsen or if the condition fails to improve as anticipated.  [place] [phone]  Other Instructions Take prednisone and colchicine as prescribed.  If no improvement follow up with Primary Care Physician.  If you have been instructed to have an in-person evaluation today at a local Urgent Care facility, please use the link below. It will take you to a list of all of our available [facility], including address, phone number and hours of operation. Please do not delay care.  [facility]  If you or a family member do not have a primary care provider, use the link below to schedule a visit and establish care. When you choose a [facility] primary care physician or advanced practice provider, you gain a long-term partner in health. Find a Primary Care Provider  Learn more about [place]'s in-office and virtual care options: [facility]

## 2022-08-07 NOTE — Progress Notes (Signed)
Virtual Visit Consent   Levi Davis, you are scheduled for a virtual visit with a [facility] provider today. Just as with appointments in the office, your consent must be obtained to participate. Your consent will be active for this visit and any virtual visit you may have with one of our providers in the next 365 days. If you have a MyChart account, a copy of this consent can be sent to you electronically.  As this is a virtual visit, video technology does not allow for your provider to perform a traditional examination. This may limit your provider's ability to fully assess your condition. If your provider identifies any concerns that need to be evaluated in person or the need to arrange testing (such as labs, EKG, etc.), we will make arrangements to do so. Although advances in technology are sophisticated, we cannot ensure that it will always work on either your end or our end. If the connection with a video visit is poor, the visit may have to be switched to a telephone visit. With either a video or telephone visit, we are not always able to ensure that we have a secure connection.  By engaging in this virtual visit, you consent to the provision of healthcare and authorize for your insurance to be billed (if applicable) for the services provided during this visit. Depending on your insurance coverage, you may receive a charge related to this service.  I need to obtain your verbal consent now. Are you willing to proceed with your visit today? Levi Davis has provided verbal consent on 08/07/2022 for a virtual visit (video or telephone). Levi Arena Ward, PA-C  Date: 08/07/2022 2:18 PM  Virtual Visit via Video Note   I, Levi Davis, connected with  Levi Davis  ([id number], 03/27/1956) on 08/07/22 at  2:15 PM EST by a video-enabled telemedicine application and verified that I am speaking with the correct person using two identifiers.  Location: Patient: Virtual Visit Location Patient:  Home Provider: Virtual Visit Location Provider: Home Office   I discussed the limitations of evaluation and management by telemedicine and the availability of in person appointments. The patient expressed understanding and agreed to proceed.    History of Present Illness: Levi Davis is a 67 y.o. who identifies as a male who was assigned male at birth, and is being seen today for gout flare up involving the left ankle.  H/o gout, last flare 1.5 years ago.  Pt reports pain around left ankle, feels like typical flare.  Reports on the road recently, consuming steak and drinking red wine.  Pt follows with PCP, has discussed allopurinol in the past. Sx typically resolve with colchicine and prednisone. No recent injury or trauma.   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 Start with 2 tabs (1.2 mg) once then  1 tab every 12 hours after, Disp: 60 tablet, Rfl: 0   predniSONE (DELTASONE) 20 MG tablet, Take 2 tablets (40 mg total) by mouth daily with breakfast for 7 days., Disp: 14 tablet, Rfl: 0   Acetylcysteine (N-ACETYL-L-CYSTEINE PO), Take 1 tablet by mouth daily., Disp: , Rfl:    apixaban (ELIQUIS) 5 MG TABS tablet, Take 1 tablet (5 mg total) by mouth 2 (two) times daily., Disp: 60  tablet, Rfl: 11   ascorbic acid (VITAMIN C) 500 MG tablet, Take 500 mg by mouth daily., Disp: , Rfl:    Cholecalciferol (DIALYVITE VITAMIN D 5000) 125 MCG (5000 UT) capsule, Take 5,000 Units by mouth daily., Disp: , Rfl:    Coenzyme Q10 300 MG CAPS, Take 300 mg by mouth daily., Disp: , Rfl:    Cyanocobalamin (B-12) 5000 MCG CAPS, Take 5,000 mcg by mouth daily., Disp: , Rfl:    doxycycline (VIBRAMYCIN) 100 MG capsule, Take 100 mg by mouth 2 (two) times daily., Disp: , Rfl:    L-ARGININE PO, Take 1 tablet by mouth daily., Disp: , Rfl:    metoprolol tartrate (LOPRESSOR) 25 MG tablet, Take 25 mg by mouth daily as needed (palpitations)., Disp: , Rfl:    NIACIN PO, Take 640 mg by mouth daily., Disp: , Rfl:    OVER THE COUNTER MEDICATION, Take 1 tablet by mouth daily. arterial protect supplement, Disp: , Rfl:    OVER THE COUNTER MEDICATION, Take 1 capsule by mouth daily. Super K, Disp: , Rfl:   Observations/Objective: Patient is well-developed, well-nourished in no acute distress.  Resting comfortably  at home.  Head is normocephalic, atraumatic.  No labored breathing.  Speech is clear and coherent with logical content.  Patient is alert and oriented at baseline.    Assessment and Plan: 1. Acute gout of left ankle, unspecified cause - predniSONE (DELTASONE) 20 MG tablet; Take 2 tablets (40 mg total) by mouth daily with breakfast for 7 days.  Dispense: 14 tablet; Refill: 0 - colchicine 0.6 MG tablet; Take 1 tablet (0.6 mg total) by mouth daily. Start with 2 tabs (1.2 mg) once then 1 tab every 12 hours after  Dispense: 60 tablet; Refill: 0  Advised follow up with PCP if no improvement  Follow Up Instructions: I discussed the assessment and treatment plan with the patient. The patient was provided an opportunity to ask questions and all were answered. The patient agreed with the plan and demonstrated an understanding of the instructions.  A copy of instructions were sent to the patient via MyChart  unless otherwise noted below.     The patient was advised to call back or seek an in-person evaluation if the symptoms worsen or if the condition fails to improve as anticipated.  Time:  I spent 10 minutes with the patient via telehealth technology discussing the above problems/concerns.    Levi Arena Ward, PA-C

## 2022-09-08 ENCOUNTER — Telehealth: Payer: Self-pay | Admitting: Family Medicine

## 2022-09-08 NOTE — Telephone Encounter (Signed)
Called patient to schedule Medicare Annual Wellness Visit (AWV). Left message for patient to call back and schedule Medicare Annual Wellness Visit (AWV).  Last date of AWV:  AWVI eligible as of 06/28/2022  Please schedule an AWVI appointment at any time with [facility].  If any questions, please contact me at [phone].    Thank you,  [place] Direct dial  [phone]

## 2022-09-23 ENCOUNTER — Telehealth: Payer: Self-pay | Admitting: Family Medicine

## 2022-09-23 NOTE — Telephone Encounter (Signed)
Called patient to schedule Medicare Annual Wellness Visit (AWV). Left message for patient to call back and schedule Medicare Annual Wellness Visit (AWV).  Last date of AWV: AWVI eligible as of 06/28/2022  Please schedule an AWVI appointment at any time with [place] [facility].  If any questions, please contact me at [phone].    Thank you,  [place] Direct dial  [phone]

## 2022-10-12 ENCOUNTER — Telehealth: Payer: Self-pay | Admitting: Family Medicine

## 2022-10-12 NOTE — Telephone Encounter (Signed)
Contacted Levi Davis to schedule their annual wellness visit. Appointment made for 10/13/2022.  Thank you,  [facility] Support [facility] Direct dial  [phone]

## 2022-10-13 ENCOUNTER — Ambulatory Visit (INDEPENDENT_AMBULATORY_CARE_PROVIDER_SITE_OTHER): Payer: Medicare Other | Admitting: *Deleted

## 2022-10-13 DIAGNOSIS — Z0000 Encounter for general adult medical examination without abnormal findings: Secondary | ICD-10-CM

## 2022-10-13 NOTE — Patient Instructions (Signed)
Levi Davis , Thank you for taking time to come for your Medicare Wellness Visit. I appreciate your ongoing commitment to your health goals. Please review the following plan we discussed and let me know if I can assist you in the future.   Screening recommendations/referrals: Colonoscopy: Education provided Recommended yearly ophthalmology/optometry visit for glaucoma screening and checkup Recommended yearly dental visit for hygiene and checkup  Vaccinations: Influenza vaccine:  Pneumococcal vaccine:  Tdap vaccine: up to date Shingles vaccine:     Advanced directives: Education provided   Preventive Care 65 Years and Older, Male Preventive care refers to lifestyle choices and visits with your health care provider that can promote health and wellness. What does preventive care include? A yearly physical exam. This is also called an annual well check. Dental exams once or twice a year. Routine eye exams. Ask your health care provider how often you should have your eyes checked. Personal lifestyle choices, including: Daily care of your teeth and gums. Regular physical activity. Eating a healthy diet. Avoiding tobacco and drug use. Limiting alcohol use. Practicing safe sex. Taking low doses of aspirin every day. Taking vitamin and mineral supplements as recommended by your health care provider. What happens during an annual well check? The services and screenings done by your health care provider during your annual well check will depend on your age, overall health, lifestyle risk factors, and family history of disease. Counseling  Your health care provider may ask you questions about your: Alcohol use. Tobacco use. Drug use. Emotional well-being. Home and relationship well-being. Sexual activity. Eating habits. History of falls. Memory and ability to understand (cognition). Work and work Astronomer. Screening  You may have the following tests or measurements: Height,  weight, and BMI. Blood pressure. Lipid and cholesterol levels. These may be checked every 5 years, or more frequently if you are over 18 years old. Skin check. Lung cancer screening. You may have this screening every year starting at age 51 if you have a 30-pack-year history of smoking and currently smoke or have quit within the past 15 years. Fecal occult blood test (FOBT) of the stool. You may have this test every year starting at age 53. Flexible sigmoidoscopy or colonoscopy. You may have a sigmoidoscopy every 5 years or a colonoscopy every 10 years starting at age 56. Prostate cancer screening. Recommendations will vary depending on your family history and other risks. Hepatitis C blood test. Hepatitis B blood test. Sexually transmitted disease (STD) testing. Diabetes screening. This is done by checking your blood sugar (glucose) after you have not eaten for a while (fasting). You may have this done every 1-3 years. Abdominal aortic aneurysm (AAA) screening. You may need this if you are a current or former smoker. Osteoporosis. You may be screened starting at age 79 if you are at high risk. Talk with your health care provider about your test results, treatment options, and if necessary, the need for more tests. Vaccines  Your health care provider may recommend certain vaccines, such as: Influenza vaccine. This is recommended every year. Tetanus, diphtheria, and acellular pertussis (Tdap, Td) vaccine. You may need a Td booster every 10 years. Zoster vaccine. You may need this after age 71. Pneumococcal 13-valent conjugate (PCV13) vaccine. One dose is recommended after age 55. Pneumococcal polysaccharide (PPSV23) vaccine. One dose is recommended after age 6. Talk to your health care provider about which screenings and vaccines you need and how often you need them. This information is not intended to replace advice  given to you by your health care provider. Make sure you discuss any  questions you have with your health care provider. Document Released: 07/11/2015 Document Revised: 03/03/2016 Document Reviewed: 04/15/2015 Elsevier Interactive Patient Education  2017 [place] Prevention in the Home Falls can cause injuries. They can happen to people of all ages. There are many things you can do to make your home safe and to help prevent falls. What can I do on the outside of my home? Regularly fix the edges of walkways and driveways and fix any cracks. Remove anything that might make you trip as you walk through a door, such as a raised step or threshold. Trim any bushes or trees on the path to your home. Use bright outdoor lighting. Clear any walking paths of anything that might make someone trip, such as rocks or tools. Regularly check to see if handrails are loose or broken. Make sure that both sides of any steps have handrails. Any raised decks and porches should have guardrails on the edges. Have any leaves, snow, or ice cleared regularly. Use sand or salt on walking paths during winter. Clean up any spills in your garage right away. This includes oil or grease spills. What can I do in the bathroom? Use night lights. Install grab bars by the toilet and in the tub and shower. Do not use towel bars as grab bars. Use non-skid mats or decals in the tub or shower. If you need to sit down in the shower, use a plastic, non-slip stool. Keep the floor dry. Clean up any water that spills on the floor as soon as it happens. Remove soap buildup in the tub or shower regularly. Attach bath mats securely with double-sided non-slip rug tape. Do not have throw rugs and other things on the floor that can make you trip. What can I do in the bedroom? Use night lights. Make sure that you have a light by your bed that is easy to reach. Do not use any sheets or blankets that are too big for your bed. They should not hang down onto the floor. Have a firm chair that has side  arms. You can use this for support while you get dressed. Do not have throw rugs and other things on the floor that can make you trip. What can I do in the kitchen? Clean up any spills right away. Avoid walking on wet floors. Keep items that you use a lot in easy-to-reach places. If you need to reach something above you, use a strong step stool that has a grab bar. Keep electrical cords out of the way. Do not use floor polish or wax that makes floors slippery. If you must use wax, use non-skid floor wax. Do not have throw rugs and other things on the floor that can make you trip. What can I do with my stairs? Do not leave any items on the stairs. Make sure that there are handrails on both sides of the stairs and use them. Fix handrails that are broken or loose. Make sure that handrails are as long as the stairways. Check any carpeting to make sure that it is firmly attached to the stairs. Fix any carpet that is loose or worn. Avoid having throw rugs at the top or bottom of the stairs. If you do have throw rugs, attach them to the floor with carpet tape. Make sure that you have a light switch at the top of the stairs and the bottom of  the stairs. If you do not have them, ask someone to add them for you. What else can I do to help prevent falls? Wear shoes that: Do not have high heels. Have rubber bottoms. Are comfortable and fit you well. Are closed at the toe. Do not wear sandals. If you use a stepladder: Make sure that it is fully opened. Do not climb a closed stepladder. Make sure that both sides of the stepladder are locked into place. Ask someone to hold it for you, if possible. Clearly mark and make sure that you can see: Any grab bars or handrails. First and last steps. Where the edge of each step is. Use tools that help you move around (mobility aids) if they are needed. These include: Canes. Walkers. Scooters. Crutches. Turn on the lights when you go into a dark area.  Replace any light bulbs as soon as they burn out. Set up your furniture so you have a clear path. Avoid moving your furniture around. If any of your floors are uneven, fix them. If there are any pets around you, be aware of where they are. Review your medicines with your doctor. Some medicines can make you feel dizzy. This can increase your chance of falling. Ask your doctor what other things that you can do to help prevent falls. This information is not intended to replace advice given to you by your health care provider. Make sure you discuss any questions you have with your health care provider. Document Released: 04/10/2009 Document Revised: 11/20/2015 Document Reviewed: 07/19/2014 Elsevier Interactive Patient Education  2017 [employer].

## 2022-10-13 NOTE — Progress Notes (Signed)
Subjective:   Levi Davis is a 67 y.o. male who presents for an Initial Medicare Annual Wellness Visit.  I connected with  Levi Davis on 10/13/22 by a telephone enabled telemedicine application and verified that I am speaking with the correct person using two identifiers.   I discussed the limitations of evaluation and management by telemedicine. The patient expressed understanding and agreed to proceed.  Patient location: home  Provider location: telephone /home    Review of Systems     Cardiac Risk Factors include: advanced age (>63men, >58 women);male gender;family history of premature cardiovascular disease     Objective:    There were no vitals filed for this visit. There is no height or weight on file to calculate BMI.     10/13/2022    1:34 PM 03/10/2022    9:38 AM 04/07/2021    3:20 PM  Advanced Directives  Does Patient Have a Medical Advance Directive? No Yes No  Type of Advance Directive  Living will   Would patient like information on creating a medical advance directive? No - Patient declined  No - Patient declined    Current Medications (verified) Outpatient Encounter Medications as of 10/13/2022  Medication Sig   Acetylcysteine (N-ACETYL-L-CYSTEINE PO) Take 1 tablet by mouth daily.   apixaban (ELIQUIS) 5 MG TABS tablet Take 1 tablet (5 mg total) by mouth 2 (two) times daily.   ascorbic acid (VITAMIN C) 500 MG tablet Take 500 mg by mouth daily.   Cholecalciferol (DIALYVITE VITAMIN D 5000) 125 MCG (5000 UT) capsule Take 5,000 Units by mouth daily.   Coenzyme Q10 300 MG CAPS Take 300 mg by mouth daily.   colchicine 0.6 MG tablet Take 1 tablet (0.6 mg total) by mouth daily. Start with 2 tabs (1.2 mg) once then 1 tab every 12 hours after   Cyanocobalamin (B-12) 5000 MCG CAPS Take 5,000 mcg by mouth daily.   L-ARGININE PO Take 1 tablet by mouth daily.   NIACIN PO Take 640 mg by mouth daily.   OVER THE COUNTER MEDICATION Take 1 tablet by mouth daily. arterial  protect supplement   OVER THE COUNTER MEDICATION Take 1 capsule by mouth daily. Super K   doxycycline (VIBRAMYCIN) 100 MG capsule Take 100 mg by mouth 2 (two) times daily.   metoprolol tartrate (LOPRESSOR) 25 MG tablet Take 25 mg by mouth daily as needed (palpitations).   No facility-administered encounter medications on file as of 10/13/2022.    Allergies (verified) Patient has no known allergies.   History: Past Medical History:  Diagnosis Date   A-fib 06/23/2018   Arrhythmia 2013   AFIB   Closed right ankle fracture    Gout    Hypercholesterolemia    Stroke    Past Surgical History:  Procedure Laterality Date   ATRIAL FIBRILLATION ABLATION N/A 03/10/2022   Procedure: ATRIAL FIBRILLATION ABLATION;  Surgeon: Regan Lemming, MD;  Location: [facility];  Service: Cardiovascular;  Laterality: N/A;   broke leg     screws and pins   Family History  Problem Relation Age of Onset   Early death Mother    COPD Mother    High blood pressure Mother    Anuerysm Mother    Stroke Mother    Heart disease Father    Early death Father    COPD Father    Heart Problems Father        PACER   High blood pressure Father    Obesity  Sister        pacer   Aneurysm Sister    High blood pressure Sister    Social History   Socioeconomic History   Marital status: Married    Spouse name: Not on file   Number of children: Not on file   Years of education: Not on file   Highest education level: Not on file  Occupational History   Not on file  Tobacco Use   Smoking status: Never   Smokeless tobacco: Never  Vaping Use   Vaping Use: Never used  Substance and Sexual Activity   Alcohol use: Yes    Comment: occasional   Drug use: Never   Sexual activity: Yes    Birth control/protection: None  Other Topics Concern   Not on file  Social History Narrative   Not on file   Social Determinants of Health   Financial Resource Strain: Low Risk  (10/13/2022)   Overall Financial  Resource Strain (CARDIA)    Difficulty of Paying Living Expenses: Not hard at all  Food Insecurity: No Food Insecurity (10/13/2022)   Hunger Vital Sign    Worried About Running Out of Food in the Last Year: Never true    Ran Out of Food in the Last Year: Never true  Transportation Needs: No Transportation Needs (10/13/2022)   PRAPARE - Administrator, Civil Service (Medical): No    Lack of Transportation (Non-Medical): No  Physical Activity: Sufficiently Active (10/13/2022)   Exercise Vital Sign    Days of Exercise per Week: 4 days    Minutes of Exercise per Session: 60 min  Stress: No Stress Concern Present (10/13/2022)   [employer] of Occupational Health - Occupational Stress Questionnaire    Feeling of Stress : Not at all  Social Connections: Socially Integrated (10/13/2022)   Social Connection and Isolation Panel [NHANES]    Frequency of Communication with Friends and Family: More than three times a week    Frequency of Social Gatherings with Friends and Family: More than three times a week    Attends Religious Services: More than 4 times per year    Active Member of [employer] or Organizations: Yes    Attends Engineer, structural: More than 4 times per year    Marital Status: Married    Tobacco Counseling Counseling given: Not Answered   Clinical Intake:              How often do you need to have someone help you when you read instructions, pamphlets, or other written materials from your doctor or pharmacy?: (P) 1 - Never  Diabetic?  no         Activities of Daily Living    10/13/2022    1:40 PM 10/12/2022   12:24 PM  In your present state of health, do you have any difficulty performing the following activities:  Hearing? 0 0  Vision? 0 0  Difficulty concentrating or making decisions? 0 0  Walking or climbing stairs? 0 0  Dressing or bathing? 0 0  Doing errands, shopping? 0 0  Preparing Food and eating ? N N  Using the Toilet? N N   In the past six months, have you accidently leaked urine? N N  Do you have problems with loss of bowel control? N N  Managing your Medications?  N  Managing your Finances? N N  Housekeeping or managing your Housekeeping? N N    Patient Care Team: Shade Flood,  MD as PCP - General (Family Medicine) Wendall Stade, MD as PCP - Cardiology (Cardiology) Regan Lemming, MD as PCP - Electrophysiology (Cardiology)  Indicate any recent Medical Services you may have received from other than [facility] providers in the past year (date may be approximate).     Assessment:   This is a routine wellness examination for Jaccob.  Hearing/Vision screen Hearing Screening - Comments:: No trouble hearing Vision Screening - Comments:: Not up to date  Dietary issues and exercise activities discussed: Current Exercise Habits: Home exercise routine, Time (Minutes): 50, Frequency (Times/Week): 5, Weekly Exercise (Minutes/Week): 250, Intensity: Moderate, Exercise limited by: None identified   Goals Addressed             This Visit's Progress    Patient Stated       Continue current lifestyle       Depression Screen    10/13/2022    1:44 PM 10/13/2022    1:37 PM 05/03/2022   12:53 PM 07/10/2021    9:29 AM  PHQ 2/9 Scores  PHQ - 2 Score 0 0 0 0  PHQ- 9 Score 0 0 0     Fall Risk    10/13/2022    1:32 PM 10/12/2022   12:24 PM 05/03/2022   12:53 PM 07/10/2021    9:29 AM  Fall Risk   Falls in the past year? 0 0 0 0  Number falls in past yr: 0  0 0  Injury with Fall? 0  0 0  Risk for fall due to :   No Fall Risks No Fall Risks  Follow up Falls evaluation completed;Education provided;Falls prevention discussed  Falls evaluation completed Falls evaluation completed    FALL RISK PREVENTION PERTAINING TO THE HOME:  Any stairs in or around the home? Yes  If so, are there any without handrails? No  Home free of loose throw rugs in walkways, pet beds, electrical cords, etc? Yes  Adequate  lighting in your home to reduce risk of falls? Yes   ASSISTIVE DEVICES UTILIZED TO PREVENT FALLS:  Life alert? No  Use of a cane, walker or w/c? No  Grab bars in the bathroom? No  Shower chair or bench in shower? Yes  Elevated toilet seat or a handicapped toilet?  normal  TIMED UP AND GO:  Was the test performed? No .    Cognitive Function:        10/13/2022    1:35 PM  6CIT Screen  What Year? 0 points  What month? 0 points  Count back from 20 0 points  Months in reverse 0 points  Repeat phrase 0 points    Immunizations Immunization History  Administered Date(s) Administered   Tdap 11/15/2021    TDAP status: Up to date  Flu Vaccine status: Declined, Education has been provided regarding the importance of this vaccine but patient still declined. Advised may receive this vaccine at local pharmacy or Health Dept. Aware to provide a copy of the vaccination record if obtained from local pharmacy or Health Dept. Verbalized acceptance and understanding.  Pneumococcal vaccine status: Declined,  Education has been provided regarding the importance of this vaccine but patient still declined. Advised may receive this vaccine at local pharmacy or Health Dept. Aware to provide a copy of the vaccination record if obtained from local pharmacy or Health Dept. Verbalized acceptance and understanding.   Covid-19 vaccine status: Declined, Education has been provided regarding the importance of this vaccine but patient still declined.  Advised may receive this vaccine at local pharmacy or Health Dept.or vaccine clinic. Aware to provide a copy of the vaccination record if obtained from local pharmacy or Health Dept. Verbalized acceptance and understanding.  Qualifies for Shingles Vaccine? Yes   Zostavax completed No   Shingrix Completed?: No.    Education has been provided regarding the importance of this vaccine. Patient has been advised to call insurance company to determine out of pocket  expense if they have not yet received this vaccine. Advised may also receive vaccine at local pharmacy or Health Dept. Verbalized acceptance and understanding.  Screening Tests Health Maintenance  Topic Date Due   COVID-19 Vaccine (1) 10/29/2022 (Originally 01/12/1956)   Zoster Vaccines- Shingrix (1 of 2) 01/12/2023 (Originally 07/15/2006)   Pneumonia Vaccine 29+ Years old (1 of 1 - PCV) 05/04/2023 (Originally 07/15/2021)   COLONOSCOPY (Pts 45-59yrs Insurance coverage will need to be confirmed)  05/04/2023 (Originally 07/15/2001)   Hepatitis C Screening  05/04/2023 (Originally 07/15/1973)   INFLUENZA VACCINE  01/27/2023   Medicare Annual Wellness (AWV)  10/13/2023   DTaP/Tdap/Td (2 - Td or Tdap) 11/16/2031   HPV VACCINES  Aged Out    Health Maintenance  There are no preventive care reminders to display for this patient.   Colonoscopy will discuss with doctor Neva Seat at upcoming appointment  Lung Cancer Screening: (Low Dose CT Chest recommended if Age 55-80 years, 30 pack-year currently smoking OR have quit w/in 15years.) does not qualify.   Lung Cancer Screening Referral:   Additional Screening:  Hepatitis C Screening: does not qualify; Completed done by prior pcp   Vision Screening: Recommended annual ophthalmology exams for early detection of glaucoma and other disorders of the eye. Is the patient up to date with their annual eye exam?  No  Who is the provider or what is the name of the office in which the patient attends annual eye exams?  If pt is not established with a provider, would they like to be referred to a provider to establish care? No .   Dental Screening: Recommended annual dental exams for proper oral hygiene  Community Resource Referral / Chronic Care Management: CRR required this visit?  No   CCM required this visit?  No      Plan:     I have personally reviewed and noted the following in the patient's chart:   Medical and social history Use of alcohol,  tobacco or illicit drugs  Current medications and supplements including opioid prescriptions. Patient is not currently taking opioid prescriptions. Functional ability and status Nutritional status Physical activity Advanced directives List of other physicians Hospitalizations, surgeries, and ER visits in previous 12 months Vitals Screenings to include cognitive, depression, and falls Referrals and appointments  In addition, I have reviewed and discussed with patient certain preventive protocols, quality metrics, and best practice recommendations. A written personalized care plan for preventive services as well as general preventive health recommendations were provided to patient.     Remi Haggard, LPN   [id number]/95/[id number]   Nurse Notes:

## 2022-11-16 ENCOUNTER — Other Ambulatory Visit: Payer: Self-pay | Admitting: Cardiology

## 2022-11-16 NOTE — Telephone Encounter (Signed)
Prescription refill request for Eliquis received. Indication:AFIB Last office visit:1/24 Scr:1.0 Age: 67 Weight:78.7  kg  Prescription refilled

## 2022-12-08 ENCOUNTER — Ambulatory Visit: Payer: Medicare Other | Attending: Cardiology | Admitting: Cardiology

## 2022-12-08 ENCOUNTER — Encounter: Payer: Self-pay | Admitting: Cardiology

## 2022-12-08 VITALS — BP 100/62 | HR 64 | Ht 72.0 in | Wt 178.0 lb

## 2022-12-08 DIAGNOSIS — I4819 Other persistent atrial fibrillation: Secondary | ICD-10-CM | POA: Diagnosis present

## 2022-12-08 DIAGNOSIS — D6869 Other thrombophilia: Secondary | ICD-10-CM | POA: Diagnosis present

## 2022-12-08 NOTE — Patient Instructions (Signed)
Medication Instructions:  Your physician recommends that you continue on your current medications as directed. Please refer to the Current Medication list given to you today.  *If you need a refill on your cardiac medications before your next appointment, please call your pharmacy*   Lab Work: None ordered   Testing/Procedures: None ordered   Follow-Up: At [facility], you and your health needs are our priority.  As part of our continuing mission to provide you with exceptional heart care, we have created designated Provider Care Teams.  These Care Teams include your primary Cardiologist (physician) and Advanced Practice Providers (APPs -  Physician Assistants and Nurse Practitioners) who all work together to provide you with the care you need, when you need it.  Your next appointment:   1 year(s)  The format for your next appointment:   In Person  Provider:   You may see Will Jorja Loa, MD or one of the following Advanced Practice Providers on your designated Care Team:   Francis Dowse, [place] Casimiro Needle "Mardelle Matte" Lanna Poche, [place]  Thank you for choosing [facility]!!   Dory Horn, RN [phone]

## 2022-12-08 NOTE — Progress Notes (Signed)
  Electrophysiology Office Note:   Date:  12/08/2022  ID:  Levi Davis, DOB 06/05/1956, MRN [id number]  Primary Cardiologist: Charlton Haws, MD Electrophysiologist: Regan Lemming, MD      History of Present Illness:   Levi Davis is a 67 y.o. male with h/o fibrillation, hyperlipidemia coronary artery disease, CVA seen today for routine electrophysiology followup.  Since last being seen in our clinic the patient reports doing he is post ablation for atrial fibrillation 03/10/2022.  Since his ablation, his Multaq was stopped.  He has had no further episodes of atrial fibrillation.  He continues to be quite active exercising vigorously.  he denies chest pain, palpitations, dyspnea, PND, orthopnea, nausea, vomiting, dizziness, syncope, edema, weight gain, or early satiety.   Review of systems complete and found to be negative unless listed in HPI.   Studies Reviewed:    EKG is ordered today. Personal review shows sinus rhythm    Risk Assessment/Calculations:    CHA2DS2-VASc Score =     This indicates a  % annual risk of stroke. The patient's score is based upon: HTN History: 0 Diabetes History: 0 Stroke History: 2 Vascular Disease History: 1 Age Score: 1 Gender Score: 0             Physical Exam:   VS:  BP 100/62   Pulse 64   Ht 6' (1.829 m)   Wt 178 lb (80.7 kg)   SpO2 95%   BMI 24.14 kg/m    Wt Readings from Last 3 Encounters:  12/08/22 178 lb (80.7 kg)  07/16/22 173 lb 9.6 oz (78.7 kg)  06/08/22 178 lb 9.6 oz (81 kg)     GEN: Well nourished, well developed in no acute distress NECK: No JVD; No carotid bruits CARDIAC: Regular rate and rhythm, no murmurs, rubs, gallops RESPIRATORY:  Clear to auscultation without rales, wheezing or rhonchi  ABDOMEN: Soft, non-tender, non-distended EXTREMITIES:  No edema; No deformity   ASSESSMENT AND PLAN:    1.  Paroxysmal atrial fibrillation: Currently on Eliquis.  CHA2DS2-VASc of 4.  Post ablation 03/10/2022.  Has had no  further episodes of atrial fibrillation.  2.  Cryptogenic stroke: Currently on Eliquis for atrial fibrillation  3.  Hyperlipidemia: Continue statin per primary cardiology  4.  Secondary hypercoagulable state: Currently on Eliquis for atrial fibrillation  Follow up with Dr. Elberta Fortis in 12 months  Signed, Jovin Fester Jorja Loa, MD

## 2023-04-12 ENCOUNTER — Telehealth: Payer: Self-pay | Admitting: Family Medicine

## 2023-04-12 DIAGNOSIS — Z125 Encounter for screening for malignant neoplasm of prostate: Secondary | ICD-10-CM

## 2023-04-12 DIAGNOSIS — E875 Hyperkalemia: Secondary | ICD-10-CM

## 2023-04-12 DIAGNOSIS — E7800 Pure hypercholesterolemia, unspecified: Secondary | ICD-10-CM

## 2023-04-12 DIAGNOSIS — I4891 Unspecified atrial fibrillation: Secondary | ICD-10-CM

## 2023-04-12 DIAGNOSIS — M10072 Idiopathic gout, left ankle and foot: Secondary | ICD-10-CM

## 2023-04-12 NOTE — Telephone Encounter (Signed)
Labs have been future ordered.  Thanks

## 2023-04-12 NOTE — Telephone Encounter (Signed)
Pt is requesting to have his labs drawn prior to his physical. He wants to have his labs drawn on 05/05/23 and his physical will be scheduled for 05/09/23

## 2023-04-12 NOTE — Telephone Encounter (Signed)
Lab appt is made and pt is aware

## 2023-04-12 NOTE — Telephone Encounter (Signed)
Do you want just standard labs? PSA, CMP, A1c, CBC, Lipid or anything additional?

## 2023-05-05 ENCOUNTER — Encounter: Payer: Medicare Other | Admitting: Family Medicine

## 2023-05-05 ENCOUNTER — Other Ambulatory Visit: Payer: Medicare Other

## 2023-05-05 DIAGNOSIS — E875 Hyperkalemia: Secondary | ICD-10-CM

## 2023-05-05 DIAGNOSIS — I4891 Unspecified atrial fibrillation: Secondary | ICD-10-CM | POA: Diagnosis not present

## 2023-05-05 DIAGNOSIS — M10072 Idiopathic gout, left ankle and foot: Secondary | ICD-10-CM

## 2023-05-05 DIAGNOSIS — Z125 Encounter for screening for malignant neoplasm of prostate: Secondary | ICD-10-CM | POA: Diagnosis not present

## 2023-05-05 DIAGNOSIS — E7800 Pure hypercholesterolemia, unspecified: Secondary | ICD-10-CM

## 2023-05-05 LAB — LIPID PANEL
Cholesterol: 254 mg/dL — ABNORMAL HIGH (ref 0–200)
HDL: 65.2 mg/dL (ref >39.00)
LDL Cholesterol: 176 mg/dL — ABNORMAL HIGH (ref 0–99)
NonHDL: 189.08
Total CHOL/HDL Ratio: 4
Triglycerides: 64 mg/dL (ref 0.0–149.0)
VLDL: 12.8 mg/dL (ref 0.0–40.0)

## 2023-05-05 LAB — COMPREHENSIVE METABOLIC PANEL
ALT: 12 U/L (ref 0–53)
AST: 16 U/L (ref 0–37)
Albumin: 4.4 g/dL (ref 3.5–5.2)
Alkaline Phosphatase: 58 U/L (ref 39–117)
BUN: 22 mg/dL (ref 6–23)
CO2: 24 meq/L (ref 19–32)
Calcium: 9.4 mg/dL (ref 8.4–10.5)
Chloride: 103 meq/L (ref 96–112)
Creatinine, Ser: 1.09 mg/dL (ref 0.40–1.50)
GFR: 70.58 mL/min (ref >60.00)
Glucose, Bld: 88 mg/dL (ref 70–99)
Potassium: 4.8 meq/L (ref 3.5–5.1)
Sodium: 134 meq/L — ABNORMAL LOW (ref 135–145)
Total Bilirubin: 0.8 mg/dL (ref 0.2–1.2)
Total Protein: 7 g/dL (ref 6.0–8.3)

## 2023-05-05 LAB — CBC
HCT: 42.1 % (ref 39.0–52.0)
Hemoglobin: 14.1 g/dL (ref 13.0–17.0)
MCHC: 33.6 g/dL (ref 30.0–36.0)
MCV: 96 fL (ref 78.0–100.0)
Platelets: 261 10*3/uL (ref 150.0–400.0)
RBC: 4.39 Mil/uL (ref 4.22–5.81)
RDW: 12.7 % (ref 11.5–15.5)
WBC: 5.9 10*3/uL (ref 4.0–10.5)

## 2023-05-05 LAB — PSA, MEDICARE: PSA: 1.53 ng/mL (ref 0.10–4.00)

## 2023-05-05 LAB — URIC ACID: Uric Acid, Serum: 7.4 mg/dL (ref 4.0–7.8)

## 2023-05-09 ENCOUNTER — Ambulatory Visit: Payer: Medicare Other | Admitting: Family Medicine

## 2023-05-09 ENCOUNTER — Encounter: Payer: Self-pay | Admitting: Family Medicine

## 2023-05-09 VITALS — BP 132/70 | HR 76 | Temp 98.3°F | Ht 72.0 in | Wt 180.0 lb

## 2023-05-09 DIAGNOSIS — M109 Gout, unspecified: Secondary | ICD-10-CM | POA: Diagnosis not present

## 2023-05-09 DIAGNOSIS — E7800 Pure hypercholesterolemia, unspecified: Secondary | ICD-10-CM

## 2023-05-09 DIAGNOSIS — R931 Abnormal findings on diagnostic imaging of heart and coronary circulation: Secondary | ICD-10-CM

## 2023-05-09 DIAGNOSIS — Z1211 Encounter for screening for malignant neoplasm of colon: Secondary | ICD-10-CM

## 2023-05-09 NOTE — Progress Notes (Signed)
Subjective:  Patient ID: Levi Davis, male    DOB: 01/24/1956  Age: 67 y.o. MRN: [id number]  CC:  Chief Complaint  Patient presents with   Medical Management of Chronic Issues    HPI Levi Davis presents for chronic med follow up.   Gout: Last flare:none recent. Episode last year. Took colchicine.  Daily meds: None with option to restart allopurinol if needed.  Prevention with diet changes previously. Prn med: Has used colchicine, prednisone if needed previously. Recognizes early symptoms - takes colchicine rarely. Every few months. Known triggers.  Lab Results  Component Value Date   LABURIC 7.4 05/05/2023   History of TIA October 2022 with chronic left internal carotid artery occlusion eval by vascular in November 2022, congenital versus dissection.  Annual duplex surveillance planned, treated with Eliquis.  Status post A-fib ablation in September 2023.  Anticoagulation with Eliquis and Multaq 400 mg twice daily, followed by cardiology.  Dr. Eden Emms cardiologist, electrophysiologist Dr. Elberta Fortis. Did have elevated coronary calcium scoring in November 2022 but statin has been declined. Appointment noted with Dr. Eden Emms January 19.  Repeat ultrasound planned in October, but plans to discuss at January follow up with cardiology. Not scheduled yet.   Still declined statin or other medications for hyperlipidemia.  Multaq discontinued after ablation.   Plans to discuss repeat CCS with cardiology in January. Variability in prior readings.  Exercising on Peloton daily, no CP/dyspnea.  Appointment noted with Dr. Elberta Fortis on June 12.   No further episodes, continued on Eliquis.  CHA2DS2-VASc score of 4.  1 year follow-up.   No new bleeding, no palpitations. Plans to remain on Eliquis at this time.   Health maintenance: Plan for repeat colon cancer screening this year. Referral today.  Declines vaccines, and hep C screening test.  Recent labs obtained - reviewed.       10/13/2022     1:44 PM 10/13/2022    1:37 PM 05/03/2022   12:53 PM 07/10/2021    9:29 AM  Depression screen PHQ 2/9  Decreased Interest 0 0 0 0  Down, Depressed, Hopeless 0 0 0 0  PHQ - 2 Score 0 0 0 0  Altered sleeping 0 0 0   Tired, decreased energy 0  0   Change in appetite 0 0 0   Feeling bad or failure about yourself  0 0 0   Trouble concentrating 0 0 0   Moving slowly or fidgety/restless 0 0 0   Suicidal thoughts 0 0 0   PHQ-9 Score 0 0 0   Difficult doing work/chores Not difficult at all Not difficult at all      Health Maintenance  Topic Date Due   Hepatitis C Screening  Never done   Colonoscopy  Never done   Zoster Vaccines- Shingrix (1 of 2) Never done   Pneumonia Vaccine 75+ Years old (1 of 1 - PCV) Never done   INFLUENZA VACCINE  Never done   COVID-19 Vaccine (1 - 2023-24 season) Never done   Medicare Annual Wellness (AWV)  10/13/2023   DTaP/Tdap/Td (2 - Td or Tdap) 11/16/2031   HPV VACCINES  Aged Out   Colonoscopy - due, ordered.   Prostate: recent testing normal.   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   Atrial fibrillation with RVR (HCC) 06/23/2018   Hypercholesterolemia 06/23/2018   Closed right ankle fracture 06/23/2018   Past Medical History:  Diagnosis Date   A-fib (HCC) 06/23/2018   Arrhythmia 2013   AFIB   Closed right ankle fracture    Gout    Hypercholesterolemia    Stroke [facility])    Past Surgical History:  Procedure Laterality Date    ATRIAL FIBRILLATION ABLATION N/A 03/10/2022   Procedure: ATRIAL FIBRILLATION ABLATION;  Surgeon: Regan Lemming, MD;  [facility];  Service: Cardiovascular;  Laterality: N/A;   broke leg     screws and pins   No Known Allergies Prior to Admission medications   Medication Sig Start Date End Date Taking? Authorizing Provider  Acetylcysteine (N-ACETYL-L-CYSTEINE PO) Take 1 tablet by mouth daily.   Yes [provider]  ascorbic acid (VITAMIN C) 500 MG tablet Take 500 mg by mouth daily.   Yes [provider]  Cholecalciferol (DIALYVITE VITAMIN D 5000) 125 MCG (5000 UT) capsule Take 5,000 Units by mouth daily.   Yes [provider]  Coenzyme Q10 300 MG CAPS Take 300 mg by mouth daily.   Yes [provider]  colchicine 0.6 MG tablet Take 1 tablet (0.6 mg total) by mouth daily. Start with 2 tabs (1.2 mg) once then 1 tab every 12 hours after 08/07/22  Yes Ward, Tylene Fantasia, PA-C  Cyanocobalamin (B-12) 5000 MCG CAPS Take 5,000 mcg by mouth daily.   Yes [provider]  ELIQUIS 5 MG TABS tablet TAKE 1 TABLET TWICE A DAY 11/16/22  Yes Camnitz, Will Daphine Deutscher, MD  L-ARGININE PO Take 1 tablet by mouth daily.   Yes [provider]  NIACIN PO Take 640 mg by mouth daily.   Yes [provider]  OVER THE COUNTER MEDICATION Take 1 tablet by mouth daily. arterial protect supplement   Yes [provider]  OVER THE COUNTER MEDICATION Take 1 capsule by mouth daily. Super K   Yes [provider]   Social History   Socioeconomic History   Marital status: Married    Spouse name: Not on file   Number of children: Not on file   Years of education: Not on file   Highest education level: Bachelor's degree (e.g., BA, AB, BS)  Occupational History   Not on file  Tobacco Use   Smoking status: Never   Smokeless tobacco: Never  Vaping Use   Vaping status: Never Used  Substance and Sexual Activity   Alcohol use: Yes     Comment: occasional   Drug use: Never   Sexual activity: Yes    Birth control/protection: None  Other Topics Concern   Not on file  Social History Narrative   Not on file   Social Determinants of Health   Financial Resource Strain: Low Risk  (05/05/2023)   Overall Financial Resource Strain (CARDIA)    Difficulty of Paying Living Expenses: Not hard at all  Food Insecurity: No Food Insecurity (05/05/2023)   Hunger Vital Sign    Worried About Running Out of Food in the Last Year: Never true    Ran Out of Food in the Last Year: Never true  Transportation Needs: No Transportation  Needs (05/05/2023)   PRAPARE - Administrator, Civil Service (Medical): No    Lack of Transportation (Non-Medical): No  Physical Activity: Sufficiently Active (05/05/2023)   Exercise Vital Sign    Days of Exercise per Week: 6 days    Minutes of Exercise per Session: 50 min  Stress: No Stress Concern Present (05/05/2023)   [employer] of Occupational Health - Occupational Stress Questionnaire    Feeling of Stress : Not at all  Social Connections: Socially Integrated (05/05/2023)   Social Connection and Isolation Panel [NHANES]    Frequency of Communication with Friends and Family: More than three times a week    Frequency of Social Gatherings with Friends and Family: More than three times a week    Attends Religious Services: More than 4 times per year    Active Member of [employer] or Organizations: Yes    Attends Banker Meetings: More than 4 times per year    Marital Status: Married  Catering manager Violence: Not At Risk (10/13/2022)   Humiliation, Afraid, Rape, and Kick questionnaire    Fear of Current or Ex-Partner: No    Emotionally Abused: No    Physically Abused: No    Sexually Abused: No    Review of Systems Per HPI.   Objective:   Vitals:   05/09/23 1458  BP: 132/70  Pulse: 76  Temp: 98.3 F (36.8 C)  TempSrc: Temporal  SpO2: 98%  Weight: 180 lb (81.6 kg)   Height: 6' (1.829 m)     Physical Exam Vitals reviewed.  Constitutional:      Appearance: He is well-developed.  HENT:     Head: Normocephalic and atraumatic.  Neck:     Vascular: No carotid bruit or JVD.  Cardiovascular:     Rate and Rhythm: Normal rate and regular rhythm.     Heart sounds: Normal heart sounds. No murmur heard. Pulmonary:     Effort: Pulmonary effort is normal.     Breath sounds: Normal breath sounds. No rales.  Musculoskeletal:     Right lower leg: No edema.     Left lower leg: No edema.  Skin:    General: Skin is warm and dry.  Neurological:     Mental Status: He is alert and oriented to person, place, and time.  Psychiatric:        Mood and Affect: Mood normal.        Assessment & Plan:  Levi Davis is a 67 y.o. male . Hypercholesterolemia Elevated coronary artery calcium score -Recent labs discussed with slightly higher LDL, and discussed coronary calcium scoring test results previously.  He plans to discuss repeat coronary calcium testing with cardiology in January.  Declined statins or other medication treatments at this time.  He is also considering discussing NMR LipoProfile with his cardiologist. Continues on Eliquis for anticoagulation with history of atrial fibrillation, denies bleeding or new side effects.  Denies palpitations or recurrence of A-fib.  Continue follow-up with cardiology and electrophysiology as planned.  Recent labs reviewed in office. -Appears there was a plan for repeat carotid artery duplex, last performed October 2023.  Will forward chart to cardiology to see if they would prefer to order or wait until January visit to discuss further.   Screening for colon cancer - Plan: Ambulatory referral to Gastroenterology  Gout, unspecified cause, unspecified chronicity, unspecified site Infrequent symptoms, option to restart allopurinol if more frequent flares even mild flares given elevated uric acid on recent  testing.  He will  let me know.  No orders of the defined types were placed in this encounter.  Patient Instructions  Thanks for coming in today. No med changes at this time.  I would consider low-dose allopurinol if any flares of gout or early flares of gout continue.  Let me know.  Discussed the cholesterol and repeat coronary calcium scoring with cardiology as planned in January.  I will also send a message to them about the updated carotid artery scan to see if they are able to order it prior to that visit.  Let me know if there are questions and take care.    Signed,   Meredith Staggers, MD [facility], [facility] 05/09/23 5:24 PM

## 2023-05-09 NOTE — Patient Instructions (Addendum)
Thanks for coming in today. No med changes at this time.  I would consider low-dose allopurinol if any flares of gout or early flares of gout continue.  Let me know.  Discussed the cholesterol and repeat coronary calcium scoring with cardiology as planned in January.  I will also send a message to them about the updated carotid artery scan to see if they are able to order it prior to that visit.  Let me know if there are questions and take care.

## 2023-05-12 ENCOUNTER — Other Ambulatory Visit: Payer: Self-pay

## 2023-05-12 DIAGNOSIS — I4819 Other persistent atrial fibrillation: Secondary | ICD-10-CM

## 2023-05-12 DIAGNOSIS — I6522 Occlusion and stenosis of left carotid artery: Secondary | ICD-10-CM

## 2023-05-12 NOTE — Progress Notes (Signed)
Levi Stade, MD  Ethelda Chick, RN Order repeat carotid duplex    Placed order for carotid

## 2023-06-08 ENCOUNTER — Ambulatory Visit (HOSPITAL_COMMUNITY)
Admission: RE | Admit: 2023-06-08 | Discharge: 2023-06-08 | Disposition: A | Discharge status: Home / Self Care | Payer: Medicare Other | Source: Ambulatory Visit | Attending: Cardiology | Admitting: Cardiology

## 2023-06-08 DIAGNOSIS — I4819 Other persistent atrial fibrillation: Secondary | ICD-10-CM | POA: Diagnosis present

## 2023-06-08 DIAGNOSIS — I6522 Occlusion and stenosis of left carotid artery: Secondary | ICD-10-CM

## 2023-07-13 NOTE — Progress Notes (Signed)
Date:  07/27/2023   ID:  Levi Davis, DOB 1956-04-15, MRN [id number]  Patient Location: Other:  office in [place] Provider Location: Office  PCP:  Shade Flood, MD  Cardiologist:  Charlton Haws, MD  Electrophysiologist:  Regan Lemming, MD   Evaluation Performed:  Follow-Up Visit  Chief Complaint:  PAF, HLD  History of Present Illness:     68 y.o. with history of HLD, PAF. CHADVASC 3 on eliquis  Post ablation with Dr Elberta Fortis 03/10/22 Multaq d/c   Calcium score 62 , 47 th percentile done during PV CTA prior to ablation 03/04/22   Has refused statin/other Rx despite high score and stroke Was sent home October 2022 with script for Lipitor 80 mg but did not fill  Most recent LDL 150 04/27/22 Prior NMR 01/01/22 with elevated particle number [id number] as well     04/09/21 admitted with stroke visual disturbance and right hand clumsiness. 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 [employer] His partner is [place]    Past Medical History:  Diagnosis Date   A-fib (HCC) 06/23/2018   Arrhythmia 2013   AFIB   Closed right ankle fracture    Gout    Hypercholesterolemia    Stroke [facility])    Past Surgical History:  Procedure Laterality Date   ATRIAL FIBRILLATION ABLATION N/A 03/10/2022    Procedure: ATRIAL FIBRILLATION ABLATION;  Surgeon: Regan Lemming, MD;  Location: [facility] INVAS[facility] CV LAB;  Service: Cardiovascular;  Laterality: N/A;   broke leg     screws and pins     Current Meds  Medication Sig   Acetylcysteine (N-ACETYL-L-CYSTEINE PO) Take 1 tablet by mouth daily.   ascorbic acid (VITAMIN C) 500 MG tablet Take 500 mg by mouth daily.   Cholecalciferol (DIALYVITE VITAMIN D 5000) 125 MCG (5000 UT) capsule Take 5,000 Units by mouth daily.   Coenzyme Q10 300 MG CAPS Take 300 mg by mouth daily.   colchicine 0.6 MG tablet Take 1 tablet (0.6 mg total) by mouth daily. Start with 2 tabs (1.2 mg) once then 1 tab every 12 hours after   Cyanocobalamin (B-12) 5000 MCG CAPS Take 5,000 mcg by mouth daily.   ELIQUIS 5 MG TABS tablet TAKE 1 TABLET TWICE A DAY   L-ARGININE PO Take 1 tablet by mouth daily.   NIACIN PO Take 640 mg by mouth daily.   OVER THE COUNTER MEDICATION Take 1 tablet by mouth daily. arterial protect supplement   OVER THE COUNTER MEDICATION Take 1 capsule by mouth daily. Super K     Allergies:   Patient has no known allergies.   Social History   Tobacco Use   Smoking status: Never   Smokeless tobacco: Never  Vaping Use   Vaping status:  Never Used  Substance Use Topics   Alcohol use: Yes    Comment: occasional   Drug use: Never     Family Hx: The patient's family history includes Aneurysm in his sister; Anuerysm in his mother; COPD in his father and mother; Early death in his father and mother; Heart Problems in his father; Heart disease in his father; High blood pressure in his father, mother, and sister; Obesity in his sister; Stroke in his mother.  ROS:   Please see the history of present illness.    General:no colds or fevers, no weight changes Skin:no rashes or ulcers HEENT:no blurred vision, no congestion CV:see HPI PUL:see HPI GI:no diarrhea constipation or melena, no indigestion GU:no hematuria, no dysuria MS:no joint pain, no  claudication Neuro:no syncope, no lightheadedness Endo:no diabetes, no thyroid disease  All other systems reviewed and are negative.   Prior CV studies:   The following studies were reviewed today:  Ca score 05/11/21 IMPRESSION: Coronary calcium score of 261. This was 52 th percentile for age-, race-, and sex-matched controls.  TTE  04/08/21  EF 60-65%  No valve disease Mild LAE 4.2 cm     Labs/Other Tests and Data Reviewed:    EKG:  SR rate 59 LAD/LVH LAE 06/26/18   Recent Labs: 05/05/2023: ALT 12; BUN 22; Creatinine, Ser 1.09; Hemoglobin 14.1; Platelets 261.0; Potassium 4.8; Sodium 134   Recent Lipid Panel Lab Results  Component Value Date/Time   CHOL 254 (H) 05/05/2023 10:59 AM   TRIG 64.0 05/05/2023 10:59 AM   HDL 65.20 05/05/2023 10:59 AM   CHOLHDL 4 05/05/2023 10:59 AM   LDLCALC 176 (H) 05/05/2023 10:59 AM    Wt Readings from Last 3 Encounters:  07/27/23 172 lb (78 kg)  05/09/23 180 lb (81.6 kg)  12/08/22 178 lb (80.7 kg)     Objective:    Vital Signs:  BP 102/60   Pulse 68   Ht 6\' 1"  (1.854 m)   Wt 172 lb (78 kg)   SpO2 97%   BMI 22.69 kg/m    Affect appropriate Healthy:  appears stated age HEENT: normal Neck supple with no adenopathy JVP normal no bruits no thyromegaly Lungs clear with no wheezing and good diaphragmatic motion Heart:  S1/S2 no murmur, no rub, gallop or click PMI normal Abdomen: benighn, BS positve, no tenderness, no AAA no bruit.  No HSM or HJR Distal pulses intact with no bruits No edema Neuro non-focal Skin warm and dry No muscular weakness    ASSESSMENT & PLAN:    PAF  post afib ablation 03/10/22 Multaq d/c on eliquis for CHADVASC 3 with previus stoke F/U Camnitz  HLD  has refused Rx with statin or other drugs discussed at length again CAD-subclinical 05/11/21 calcium score 62 , 47 th percentile No chest pain Update Carotid:  left ICA occlusion known Duplex 06/08/23 no significant right ICA dx     Medication  Adjustments/Labs and Tests Ordered: Current medicines are reviewed at length with the patient today.  Concerns regarding medicines are outlined above.   Tests Ordered: No orders of the defined types were placed in this encounter.  Coronary Calcium Score  Medication Changes: No orders of the defined types were placed in this encounter.   Follow Up:   1 year   Signed, Charlton Haws, MD  07/27/2023 8:46 AM    [facility]

## 2023-07-27 ENCOUNTER — Ambulatory Visit: Payer: Medicare Other | Attending: Cardiovascular Disease | Admitting: Cardiovascular Disease

## 2023-07-27 ENCOUNTER — Encounter: Payer: Self-pay | Admitting: Cardiovascular Disease

## 2023-07-27 VITALS — BP 102/60 | HR 68 | Ht 73.0 in | Wt 172.0 lb

## 2023-07-27 DIAGNOSIS — E782 Mixed hyperlipidemia: Secondary | ICD-10-CM | POA: Insufficient documentation

## 2023-07-27 DIAGNOSIS — I2510 Atherosclerotic heart disease of native coronary artery without angina pectoris: Secondary | ICD-10-CM | POA: Diagnosis present

## 2023-07-27 DIAGNOSIS — I6522 Occlusion and stenosis of left carotid artery: Secondary | ICD-10-CM | POA: Insufficient documentation

## 2023-07-27 NOTE — Patient Instructions (Addendum)
Medication Instructions:  Your physician recommends that you continue on your current medications as directed. Please refer to the Current Medication list given to you today.  *If you need a refill on your cardiac medications before your next appointment, please call your pharmacy*  Lab Work: If you have labs (blood work) drawn today and your tests are completely normal, you will receive your results only by: MyChart Message (if you have MyChart) OR A paper copy in the mail If you have any lab test that is abnormal or we need to change your treatment, we will call you to review the results.   Testing/Procedures: CT scanning for a cardiac calcium score (CAT scanning), is a noninvasive, special x-ray that produces cross-sectional images of the body using x-rays and a computer. CT scans help physicians diagnose and treat medical conditions. For some CT exams, a contrast material is used to enhance visibility in the area of the body being studied. CT scans provide greater clarity and reveal more details than regular x-ray exams.   Follow-Up: At [facility], you and your health needs are our priority.  As part of our continuing mission to provide you with exceptional heart care, we have created designated Provider Care Teams.  These Care Teams include your primary Cardiologist (physician) and Advanced Practice Providers (APPs -  Physician Assistants and Nurse Practitioners) who all work together to provide you with the care you need, when you need it.  We recommend signing up for the patient portal called "MyChart".  Sign up information is provided on this After Visit Summary.  MyChart is used to connect with patients for Virtual Visits (Telemedicine).  Patients are able to view lab/test results, encounter notes, upcoming appointments, etc.  Non-urgent messages can be sent to your provider as well.   To learn more about what you can do with MyChart, go to [email or web address].    Your  next appointment:   1 year(s)  Provider:   Charlton Haws, MD     Other Instructions   1st Floor: - Lobby - Registration  - Pharmacy  - Lab - Cafe  2nd Floor: - PV Lab - Diagnostic Testing (echo, CT, nuclear med)  3rd Floor: - Vacant  4th Floor: - TCTS (cardiothoracic surgery) - AFib Clinic - Structural Heart Clinic - Vascular Surgery  - Vascular Ultrasound  5th Floor: - HeartCare Cardiology (general and EP) - Clinical Pharmacy for coumadin, hypertension, lipid, weight-loss medications, and med management appointments    Valet parking services will be available as well.

## 2023-07-30 IMAGING — CT CT HEAD W/O CM
3 of 5 series · 14 of 47 positions shown, 16 images · non-contrast
Comparison: 04/07/2021 CT and MRI

CLINICAL DATA: 66-year-old male with head and neck injury from
fall. Possible seizure. Initial encounter.



[Series 5: head 3.0 mpr cor · coronal · 0.35mm/px · 3 of 70 slices shown]
[im 25/70  brain]
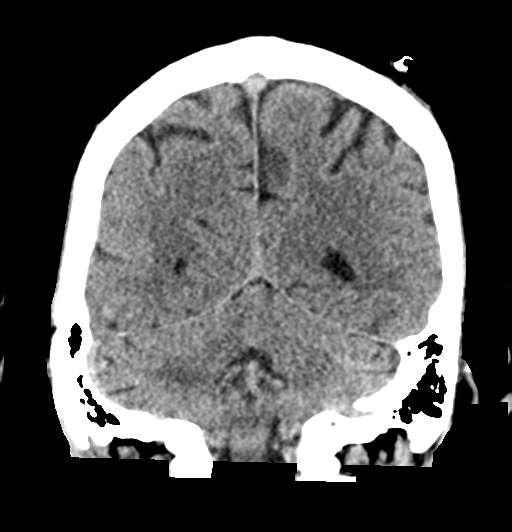
[im 32/70  brain]
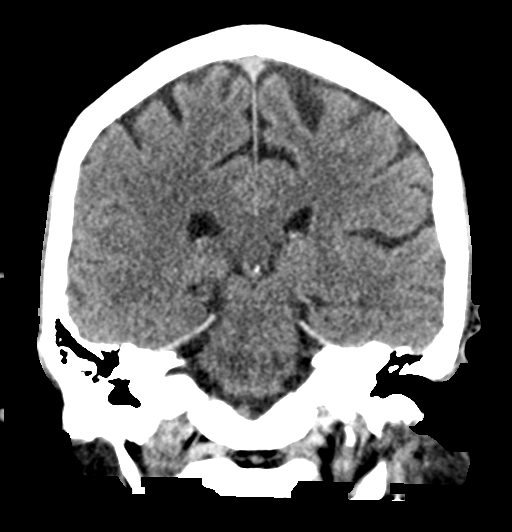
[im 38/70  brain]
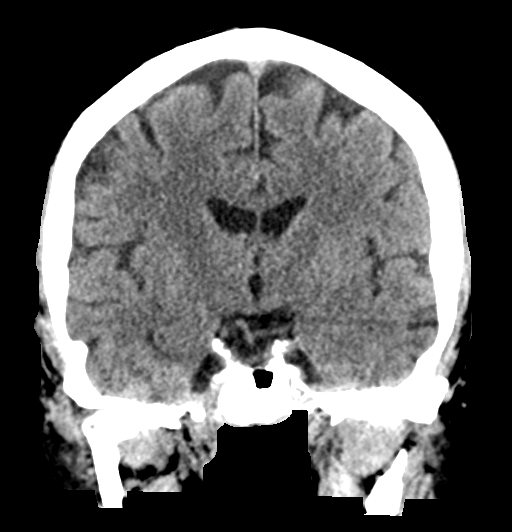

[Series 6: head 3.0 mpr sag · sagittal · 0.33mm/px · 3 of 57 slices shown]
[im 19/57  brain]
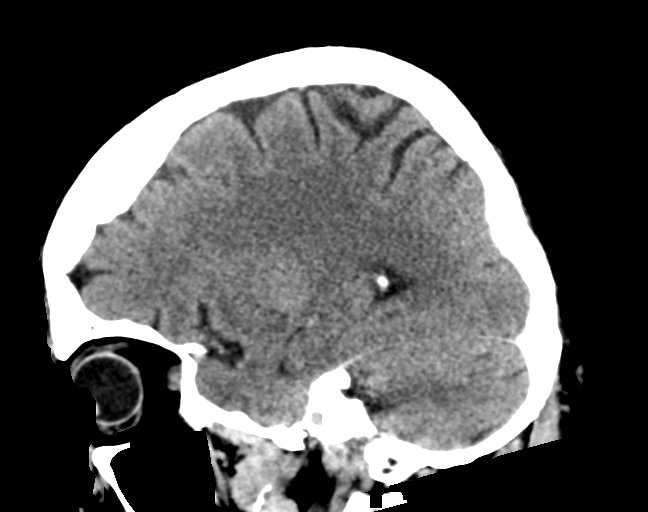
[im 29/57  brain]
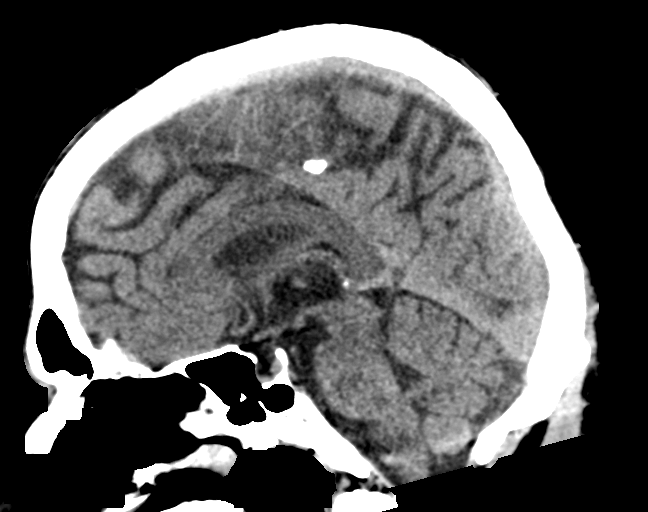
[im 38/57  brain]
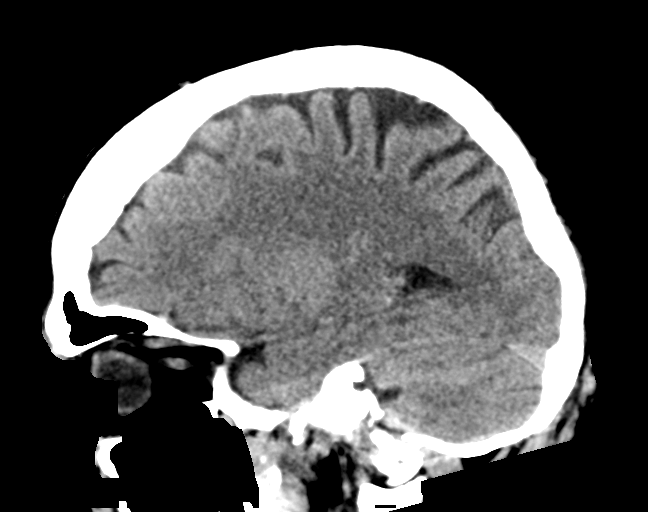

[Series 8: head 2.0 mpr ax · axial · 0.36mm/px · z∈[-104,+42]mm · 8 of 87 slices shown, 10 images]
[im 6/87  brain]
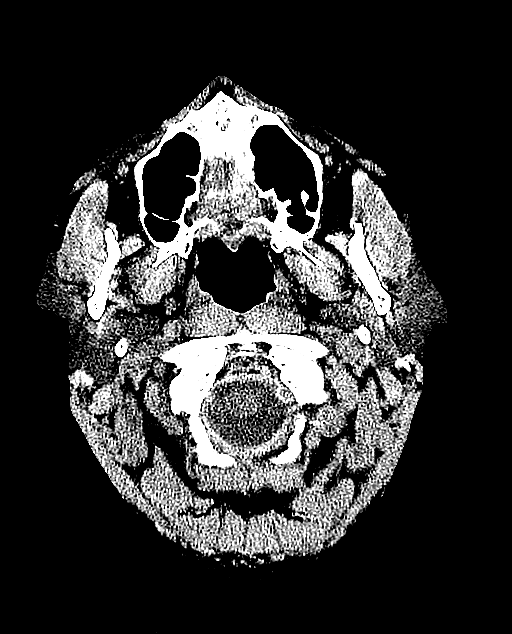
[im 6/87  bone]
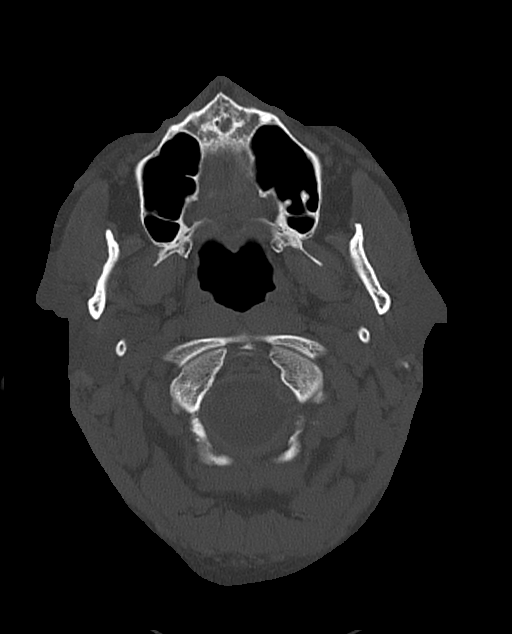
[im 17/87  brain]
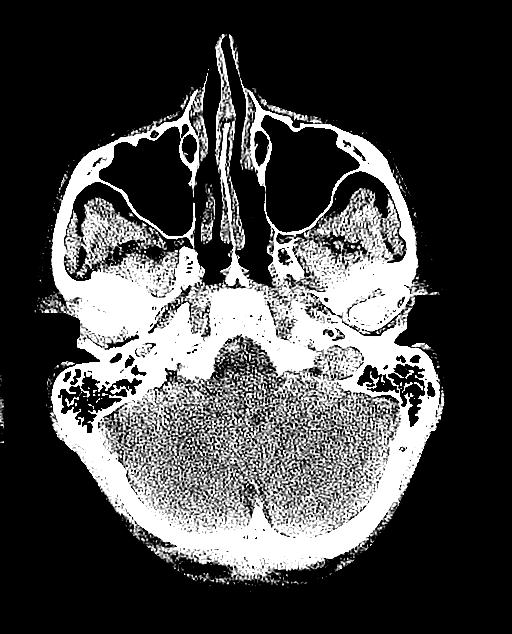
[im 27/87  brain]
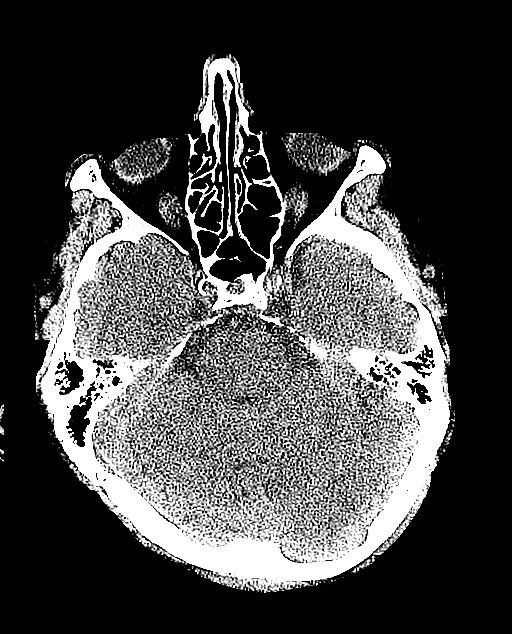
[im 38/87  brain]
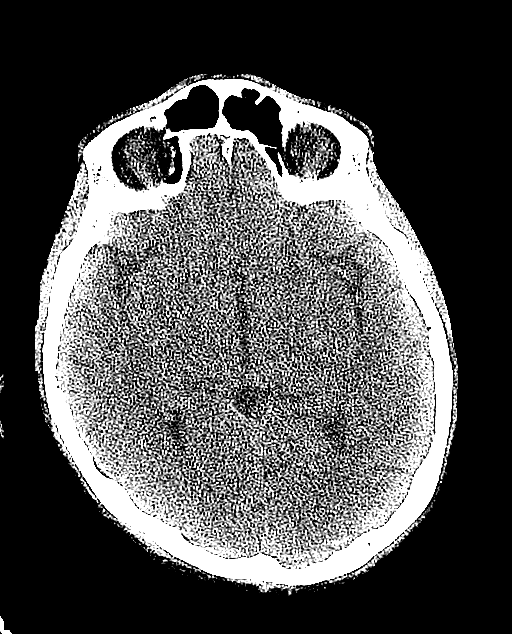
[im 49/87  brain]
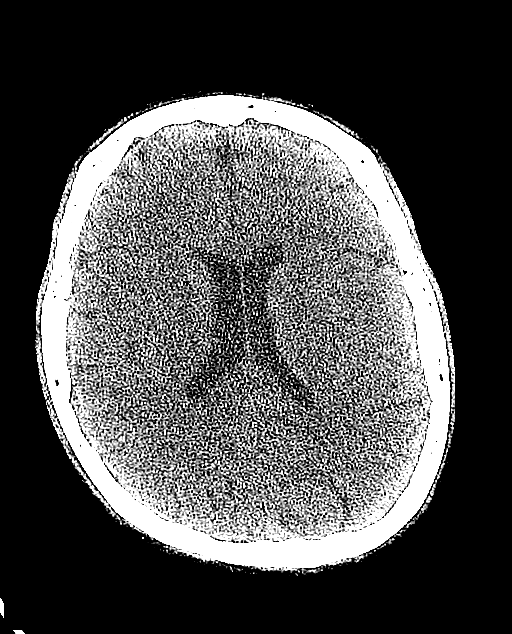
[im 49/87  bone]
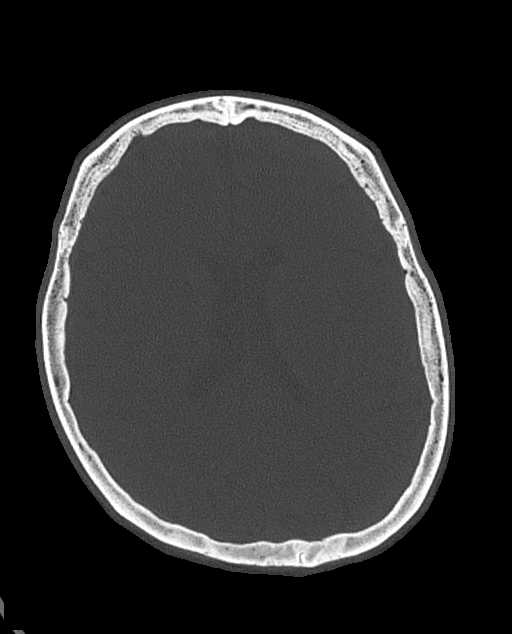
[im 60/87  brain]
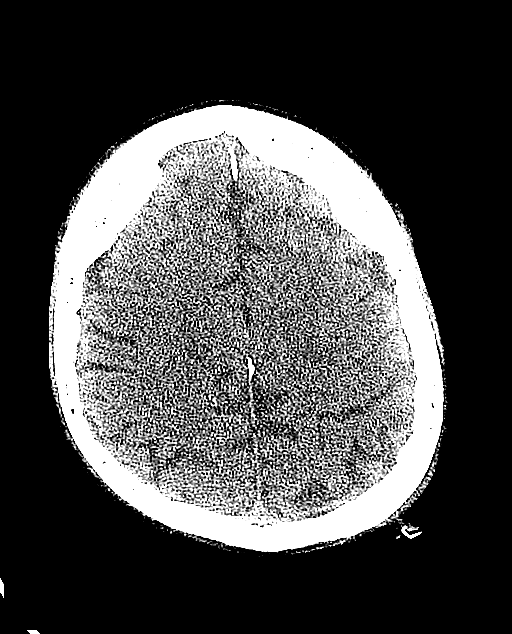
[im 70/87  brain]
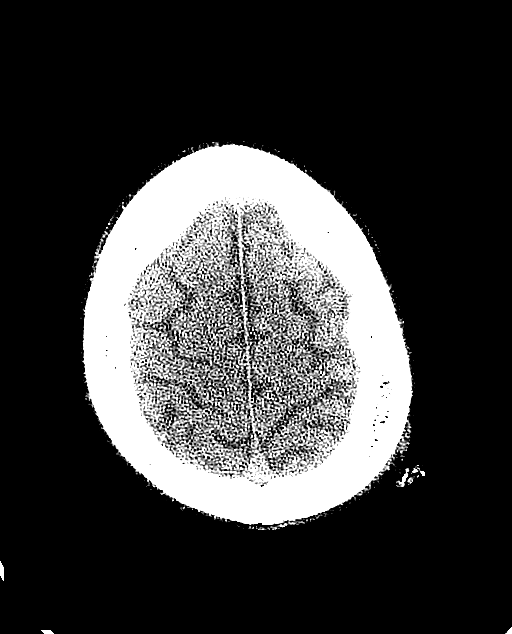
[im 81/87  brain]
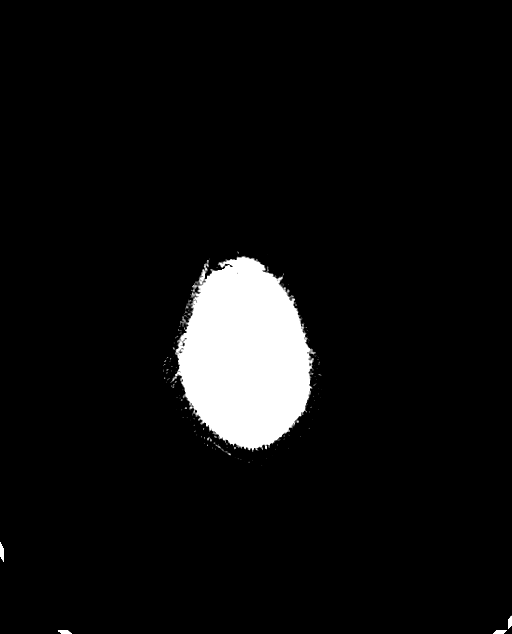

[14 of 47 positions shown; findings below may reference images not displayed]

FINDINGS: CT HEAD FINDINGS

Brain: No evidence of acute infarction, hemorrhage, hydrocephalus,
extra-axial collection or mass lesion/mass effect.

Vascular: Carotid atherosclerotic calcifications are noted.

Skull: Normal. Negative for fracture or focal lesion.

Sinuses/Orbits: No acute finding.

Other: Surgical staples along the posterior LEFT scalp noted.

CT CERVICAL SPINE FINDINGS

Alignment: Normal.

Skull base and vertebrae: No acute fracture. No primary bone lesion
or focal pathologic process.

Soft tissues and spinal canal: No prevertebral fluid or swelling. No
visible canal hematoma.

Disc levels: Mild multilevel degenerative disc disease/spondylosis
again noted contributing to mild central spinal narrowing at several
levels. Mild to moderate degenerative disc disease/spondylosis at
C3-4 again identified.

Upper chest: No acute abnormality

Other: None
IMPRESSION: 1. No evidence of acute intracranial abnormality.
2. No static evidence of acute injury to the cervical spine.
Degenerative changes as described.

## 2023-09-21 ENCOUNTER — Encounter: Payer: Self-pay | Admitting: Cardiology

## 2023-09-21 ENCOUNTER — Inpatient Hospital Stay (HOSPITAL_COMMUNITY)
Admission: EM | Admit: 2023-09-21 | Discharge: 2023-09-23 | DRG: 244 | Disposition: A | Discharge status: Home / Self Care | Attending: Cardiovascular Disease | Admitting: Cardiovascular Disease

## 2023-09-21 ENCOUNTER — Emergency Department (HOSPITAL_COMMUNITY)

## 2023-09-21 ENCOUNTER — Encounter (HOSPITAL_COMMUNITY): Payer: Self-pay

## 2023-09-21 ENCOUNTER — Other Ambulatory Visit: Payer: Self-pay

## 2023-09-21 DIAGNOSIS — Z8249 Family history of ischemic heart disease and other diseases of the circulatory system: Secondary | ICD-10-CM

## 2023-09-21 DIAGNOSIS — Z79899 Other long term (current) drug therapy: Secondary | ICD-10-CM | POA: Diagnosis not present

## 2023-09-21 DIAGNOSIS — Z7901 Long term (current) use of anticoagulants: Secondary | ICD-10-CM

## 2023-09-21 DIAGNOSIS — I442 Atrioventricular block, complete: Secondary | ICD-10-CM | POA: Diagnosis present

## 2023-09-21 DIAGNOSIS — I10 Essential (primary) hypertension: Secondary | ICD-10-CM | POA: Diagnosis present

## 2023-09-21 DIAGNOSIS — I480 Paroxysmal atrial fibrillation: Secondary | ICD-10-CM | POA: Diagnosis present

## 2023-09-21 DIAGNOSIS — E785 Hyperlipidemia, unspecified: Secondary | ICD-10-CM | POA: Diagnosis not present

## 2023-09-21 DIAGNOSIS — Z8673 Personal history of transient ischemic attack (TIA), and cerebral infarction without residual deficits: Secondary | ICD-10-CM | POA: Diagnosis not present

## 2023-09-21 DIAGNOSIS — R001 Bradycardia, unspecified: Secondary | ICD-10-CM | POA: Diagnosis present

## 2023-09-21 DIAGNOSIS — I2510 Atherosclerotic heart disease of native coronary artery without angina pectoris: Secondary | ICD-10-CM | POA: Diagnosis present

## 2023-09-21 DIAGNOSIS — R000 Tachycardia, unspecified: Secondary | ICD-10-CM

## 2023-09-21 DIAGNOSIS — R55 Syncope and collapse: Secondary | ICD-10-CM | POA: Diagnosis present

## 2023-09-21 DIAGNOSIS — I453 Trifascicular block: Secondary | ICD-10-CM | POA: Diagnosis present

## 2023-09-21 DIAGNOSIS — E7800 Pure hypercholesterolemia, unspecified: Secondary | ICD-10-CM | POA: Diagnosis present

## 2023-09-21 DIAGNOSIS — I6522 Occlusion and stenosis of left carotid artery: Secondary | ICD-10-CM | POA: Diagnosis present

## 2023-09-21 LAB — CBC
HCT: 38.6 % — ABNORMAL LOW (ref 39.0–52.0)
Hemoglobin: 12.6 g/dL — ABNORMAL LOW (ref 13.0–17.0)
MCH: 31.7 pg (ref 26.0–34.0)
MCHC: 32.6 g/dL (ref 30.0–36.0)
MCV: 97.2 fL (ref 80.0–100.0)
Platelets: 254 10*3/uL (ref 150–400)
RBC: 3.97 MIL/uL — ABNORMAL LOW (ref 4.22–5.81)
RDW: 12.5 % (ref 11.5–15.5)
WBC: 9.4 10*3/uL (ref 4.0–10.5)
nRBC: 0 % (ref 0.0–0.2)

## 2023-09-21 LAB — URINALYSIS, ROUTINE W REFLEX MICROSCOPIC
Bilirubin Urine: NEGATIVE
Glucose, UA: NEGATIVE mg/dL
Hgb urine dipstick: NEGATIVE
Ketones, ur: NEGATIVE mg/dL
Leukocytes,Ua: NEGATIVE
Nitrite: NEGATIVE
Protein, ur: NEGATIVE mg/dL
Specific Gravity, Urine: 1.006 (ref 1.005–1.030)
pH: 5 (ref 5.0–8.0)

## 2023-09-21 LAB — BASIC METABOLIC PANEL
Anion gap: 10 (ref 5–15)
BUN: 24 mg/dL — ABNORMAL HIGH (ref 8–23)
CO2: 22 mmol/L (ref 22–32)
Calcium: 9.2 mg/dL (ref 8.9–10.3)
Chloride: 105 mmol/L (ref 98–111)
Creatinine, Ser: 0.97 mg/dL (ref 0.61–1.24)
GFR, Estimated: >60 mL/min (ref >60)
Glucose, Bld: 96 mg/dL (ref 70–99)
Potassium: 4.3 mmol/L (ref 3.5–5.1)
Sodium: 137 mmol/L (ref 135–145)

## 2023-09-21 LAB — TROPONIN I (HIGH SENSITIVITY)
Troponin I (High Sensitivity): 6 ng/L (ref <18)
Troponin I (High Sensitivity): 7 ng/L (ref <18)

## 2023-09-21 LAB — CBG MONITORING, ED: Glucose-Capillary: 104 mg/dL — ABNORMAL HIGH (ref 70–99)

## 2023-09-21 LAB — TSH: TSH: 0.75 u[IU]/mL (ref 0.350–4.500)

## 2023-09-21 LAB — MAGNESIUM: Magnesium: 2.1 mg/dL (ref 1.7–2.4)

## 2023-09-21 MED ORDER — SODIUM CHLORIDE 0.9% FLUSH: 3.0000 mL | INTRAVENOUS | Status: DC | PRN

## 2023-09-21 MED ORDER — CEFAZOLIN SODIUM-DEXTROSE 2-4 GM/100ML-% IV SOLN
2.0000 g | INTRAVENOUS | Status: AC
  Administered 2023-09-22: 2 g via INTRAVENOUS

## 2023-09-21 MED ORDER — HYDRALAZINE HCL 25 MG PO TABS: 25.0000 mg | ORAL_TABLET | Freq: Three times a day (TID) | ORAL | Status: DC | PRN

## 2023-09-21 MED ORDER — CHLORHEXIDINE GLUCONATE 4 % EX SOLN
60.0000 mL | Freq: Once | CUTANEOUS | Status: AC
  Administered 2023-09-21: 4 via TOPICAL
  Filled 2023-09-21: qty 60

## 2023-09-21 MED ORDER — SODIUM CHLORIDE 0.9% FLUSH
3.0000 mL | Freq: Two times a day (BID) | INTRAVENOUS | Status: DC
  Administered 2023-09-21 – 2023-09-22 (×2): 10 mL via INTRAVENOUS

## 2023-09-21 MED ORDER — CHLORHEXIDINE GLUCONATE 4 % EX SOLN
60.0000 mL | Freq: Once | CUTANEOUS | Status: AC
  Administered 2023-09-22: 4 via TOPICAL
  Filled 2023-09-21: qty 60

## 2023-09-21 MED ORDER — ONDANSETRON HCL 4 MG/2ML IJ SOLN: 4.0000 mg | Freq: Four times a day (QID) | INTRAMUSCULAR | Status: DC | PRN

## 2023-09-21 MED ORDER — ACETAMINOPHEN 325 MG PO TABS: 650.0000 mg | ORAL_TABLET | ORAL | Status: DC | PRN

## 2023-09-21 MED ORDER — NIACIN 500 MG PO TABS
500.0000 mg | ORAL_TABLET | Freq: Every day | ORAL | Status: DC
  Administered 2023-09-21: 500 mg via ORAL
  Filled 2023-09-21 (×3): qty 1

## 2023-09-21 MED ORDER — SODIUM CHLORIDE 0.9 % IV SOLN
80.0000 mg | INTRAVENOUS | Status: AC
  Administered 2023-09-22: 80 mg

## 2023-09-21 MED ORDER — NIACIN 500 MG PO TABS: 640.0000 mg | ORAL_TABLET | Freq: Every day | ORAL | Status: DC

## 2023-09-21 MED ORDER — SODIUM CHLORIDE 0.9 % IV SOLN: INTRAVENOUS | Status: DC

## 2023-09-21 NOTE — ED Notes (Signed)
 CCMD called and notified

## 2023-09-21 NOTE — H&P (Signed)
 Cardiology Admission History and Physical   Patient ID: Levi Davis MRN: [id number]; DOB: 1955/12/02   Admission date: 09/21/2023  PCP:  Shade Flood, MD   [facility] Providers Cardiologist:  Charlton Haws, MD  Electrophysiologist:  Will Jorja Loa, MD  {    Chief Complaint:  Near syncope  Patient Profile:   Levi Davis is a 68 y.o. male with a hx of hyperlipidemia, paroxysmal atrial fibrillation on Eliquis status post ablation, history of stroke who is being seen 09/21/2023 for the evaluation of near syncope at the request of Iantha Fallen, MD.  History of Present Illness:   Levi Davis is a 68 year old male with prior cardiac history listed below  Paroxysmal atrial fibrillation was initially diagnosed in [place] in 2013.  Was not on any anticoagulation until was admitted on 04/09/2021 for CVA.  At this time was started on Eliquis for atrial fibrillation.  Sent home with a prescription for Lipitor but did not fill.  Has been refusing statin despite history of stroke, cardiac CT on 04/2021 with a coronary calcium score of 261 in the 74th percentile, and a calcium score in the 47th percentile on CTA done on 03/04/22.  LDL on 05/05/2023 was 176.   Had an ablation performed by Dr. Elberta Fortis on 03/10/22.  At this time Multaq was stopped.  Has continued to take Eliquis. Was most recently seen by Dr. Ellyn Hack on 07/27/2023.  Has also been followed by Dr. Elberta Fortis for EP.  Mr. Deremer presented to the emergency department on 09/21/2023 for evaluation of a near syncopal episode.  He felt fine when he woke up and was able to exercise.  He was sitting down eating breakfast when his wife noticed he was not acting right.  Patient reported feeling very lightheaded and that he was going to pass out.  He felt that if he was going to stand up then he would pass out.  Had another similar episode and decided to call EMS.  During the transport they noted episodes of bradycardia and multiple PVCs.  Denies any chest pain, shortness of breath, fevers.  Is not currently feeling lightheaded.  Labs in the emergency department showed an unremarkable CHEM panel with a slightly elevated BUN of 24 and a creatinine of 0.97.  blood count showed mild anemia with a hemoglobin of 12.6.  Chest x-ray showed no active disease. EKG showed sinus rhythm with a rate of 52, prolonged PR interval, left atrial enlargement, right bundle branch block, left axis deviation. This EKG is similar to prior EKG on 12/09/22.  One EMS strip appears to be sinus tachycardia/ PAT the other EKG strip is third-degree heart block.  On interview patient affirms above history.  Reported exercising as he does every day of the week.  Denies having any nausea, vomiting, chest pain, palpitations, shortness of breath, diaphoresis.   Denies feeling dehydrated reported drinking fluids prior to the event.  Denied having any lightheadedness or vision changes prior to this event. Reported being asymptomatic since getting IV fluids from EMS.  Because of the intermittent third-degree heart block the patient was admitted to cardiology and EP was consulted for a pacemaker.   Past Medical History:  Diagnosis Date   A-fib (HCC) 06/23/2018   Arrhythmia 2013   AFIB   Closed right ankle fracture    Gout    Hypercholesterolemia    Stroke [facility])     Past Surgical History:  Procedure Laterality Date   ATRIAL FIBRILLATION ABLATION N/A 03/10/2022  Procedure: ATRIAL FIBRILLATION ABLATION;  Surgeon: Regan Lemming, MD;  Location: [facility] INVASIVE CV LAB;  Service: Cardiovascular;  Laterality: N/A;   broke leg     screws and pins     Medications Prior to Admission: Prior to Admission medications   Medication Sig Start Date End Date Taking? Authorizing Provider  Acetylcysteine (N-ACETYL-L-CYSTEINE PO) Take 1 tablet by mouth daily.    [provider]  ascorbic acid (VITAMIN C) 500 MG tablet Take 500 mg by mouth daily.    [provider]  Cholecalciferol (DIALYVITE VITAMIN D 5000) 125 MCG (5000 UT) capsule Take 5,000 Units by mouth daily.    [provider]  Coenzyme Q10 300 MG CAPS Take 300 mg by mouth daily.    [provider]  colchicine 0.6 MG tablet Take 1 tablet (0.6 mg total) by mouth daily. Start with 2 tabs (1.2 mg) once then 1 tab every 12 hours after 08/07/22   Ward, Tylene Fantasia, PA-C  Cyanocobalamin (B-12) 5000 MCG CAPS Take 5,000 mcg by mouth daily.    [provider]  ELIQUIS 5 MG TABS tablet TAKE 1 TABLET TWICE A DAY 11/16/22   Camnitz, Andree Coss, MD  L-ARGININE PO Take 1 tablet by mouth daily.    [provider]  NIACIN PO Take 640 mg by mouth daily.    [provider]  OVER THE COUNTER MEDICATION Take 1 tablet by mouth daily. arterial protect supplement    [provider]  OVER THE COUNTER MEDICATION Take 1 capsule by mouth daily. Super K    [provider]     Allergies:   No Known Allergies  Social History:   Social History   Socioeconomic History   Marital status: Married    Spouse name: Not on file   Number of children: Not on file   Years of education: Not on file   Highest education level: Bachelor's degree (e.g., BA, AB, BS)  Occupational History   Not on file  Tobacco Use   Smoking status: Never   Smokeless tobacco: Never  Vaping Use   Vaping status: Never Used  Substance and Sexual Activity   Alcohol use: Yes    Comment: occasional   Drug use: Never   Sexual activity: Yes    Birth control/protection: None  Other Topics Concern   Not on file  Social History Narrative   Not on file   Social Drivers of Health   Financial Resource Strain: Low Risk  (05/05/2023)   Overall Financial Resource Strain (CARDIA)    Difficulty of Paying Living Expenses: Not hard at all  Food Insecurity: No Food Insecurity (09/21/2023)   Hunger Vital Sign    Worried About Running Out of Food in the Last Year: Never true    Ran Out of Food in  the Last Year: Never true  Transportation Needs: No Transportation Needs (09/21/2023)   PRAPARE - Administrator, Civil Service (Medical): No    Lack of Transportation (Non-Medical): No  Physical Activity: Sufficiently Active (05/05/2023)   Exercise Vital Sign    Days of Exercise per Week: 6 days    Minutes of Exercise per Session: 50 min  Stress: No Stress Concern Present (05/05/2023)   [employer] of Occupational Health - Occupational Stress Questionnaire    Feeling of Stress : Not at all  Social Connections: Socially Integrated (09/21/2023)   Social Connection and Isolation Panel [NHANES]    Frequency of Communication with Friends and  Family: More than three times a week    Frequency of Social Gatherings with Friends and Family: More than three times a week    Attends Religious Services: More than 4 times per year    Active Member of [employer] or Organizations: Yes    Attends Banker Meetings: More than 4 times per year    Marital Status: Married  Catering manager Violence: Not At Risk (09/21/2023)   Humiliation, Afraid, Rape, and Kick questionnaire    Fear of Current or Ex-Partner: No    Emotionally Abused: No    Physically Abused: No    Sexually Abused: No    Family History:   The patient's family history includes Aneurysm in his sister; Anuerysm in his mother; COPD in his father and mother; Early death in his father and mother; Heart Problems in his father; Heart disease in his father; High blood pressure in his father, mother, and sister; Obesity in his sister; Stroke in his mother.    ROS:  Please see the history of present illness.  All other ROS reviewed and negative.     Physical Exam/Data:   Vitals:   09/21/23 1330 09/21/23 1350 09/21/23 1430 09/21/23 1530  BP: 122/65 (!) 144/66 (!) 119/56 (!) 154/78  Pulse: (!) 49 (!) 59 (!) 45 (!) 54  Resp: (!) 22 12 12 18   Temp:  98.3 F (36.8 C)    TempSrc:      SpO2: 100% 100% 100% 100%  Weight:       Height:       No intake or output data in the 24 hours ending 09/21/23 1551    09/21/2023   10:37 AM 07/27/2023    8:35 AM 05/09/2023    2:58 PM  Last 3 Weights  Weight (lbs) 168 lb 172 lb 180 lb  Weight (kg) 76.204 kg 78.019 kg 81.647 kg     Body mass index is 22.78 kg/m.  General:  Well nourished, well developed, in no acute distress HEENT: normal Neck: no JVD Vascular: No carotid bruits; Distal pulses 2+ bilaterally   Cardiac:  normal S1, S2; RRR; no murmur  Lungs:  clear to auscultation bilaterally, no wheezing, rhonchi or rales  Ext: no edema Musculoskeletal:  No deformities Skin: warm and dry  Neuro:  CNs 2-12 intact, no focal abnormalities noted Psych:  Normal affect    EKG:  The ECG that was done on 09/21/23 was personally reviewed and demonstrates sinus rhythm with a rate of 52, prolonged PR interval, left atrial enlargement, right bundle branch block, left axis deviation. This EKG is similar to prior EKG on 12/09/22.  Relevant CV Studies: Echo pending  Laboratory Data:  High Sensitivity Troponin:   Recent Labs  Lab 09/21/23 1042 09/21/23 1250  TROPONINIHS 6 7      Chemistry Recent Labs  Lab 09/21/23 1042 09/21/23 1250  NA 137  --   K 4.3  --   CL 105  --   CO2 22  --   GLUCOSE 96  --   BUN 24*  --   CREATININE 0.97  --   CALCIUM 9.2  --   MG  --  2.1  GFRNONAA >60  --   ANIONGAP 10  --     No results for input(s): "PROT", "ALBUMIN", "AST", "ALT", "ALKPHOS", "BILITOT" in the last 168 hours. Lipids No results for input(s): "CHOL", "TRIG", "HDL", "LABVLDL", "LDLCALC", "CHOLHDL" in the last 168 hours. Hematology Recent Labs  Lab 09/21/23 1042  WBC 9.4  RBC 3.97*  HGB 12.6*  HCT 38.6*  MCV 97.2  MCH 31.7  MCHC 32.6  RDW 12.5  PLT 254   Thyroid No results for input(s): "TSH", "FREET4" in the last 168 hours. BNPNo results for input(s): "BNP", "PROBNP" in the last 168 hours.  DDimer No results for input(s): "DDIMER" in the last 168  hours.   Radiology/Studies:  DG Chest Portable 1 View Result Date: 09/21/2023 CLINICAL DATA:  Near-syncope. EXAM: PORTABLE CHEST 1 VIEW COMPARISON:  Chest radiograph 04/07/2021 FINDINGS: The heart size and mediastinal contours are within normal limits. Both lungs are clear. The visualized skeletal structures are unremarkable. IMPRESSION: No active disease. Electronically Signed   By: Annia Belt M.D.   On: 09/21/2023 11:44     Assessment and Plan:  Levi Davis is a 68 y.o. male with a hx of hyperlipidemia, paroxysmal atrial fibrillation on Eliquis status post ablation, history of stroke, carotid artery disease who is being seen 09/21/2023 for the evaluation of syncope at the request of Iantha Fallen, MD.  Complete heart block Near syncope Presented to the emergency department at [facility] on 09/21/2023 for near syncope.  Denied prodromal symptoms.  EKG done by EMS showed intermittent complete heart block. -The patient was not on any AV nodal agents when the episode of complete heart block happened.  Will continue to avoid AV nodal agents prior to PPM placement. - admitted to cardiology.  - Ordered TSH and Magnesium - Ordered Echo - Consult EP for PPM consideration for the intermittent complete heart block.   Paroxysmal atrial fibrillation CHA2DS2-VASc Score = 4 [CHF History: 0, HTN History: 0, Diabetes History: 0, Stroke History: 2, Vascular Disease History: 1, Age Score: 1, Gender Score: 0].  Therefore, the patient's annual risk of stroke is 4.8 %.     Was hospitalized for a stroke in 2022 and was started on Eliquis. Had an ablation performed by Dr. Elberta Fortis on 03/10/22.  At this time Multaq was stopped.   Prior to hospitalization was on Eliquis 5 mg twice daily - Hold Eliquis 5 mg twice daily will resume after pacemaker placement.   Hyperlipidemia Has been refusing statin despite history of stroke and a calcium score in the 47th percentile on CTA done on 03/04/22.  LDL on 05/05/2023 was 176.     Coronary artery calcification cardiac CT on 04/2021 with a coronary calcium score of 261 in the 74th percentile, and a calcium score in the 47th percentile on CTA done on 03/04/22. - Is scheduled for a cardiac CT next month help inform decision on hyperlipidemia management given patient reluctance.   Risk Assessment/Risk Scores:  CHA2DS2-VASc Score = 4   This indicates a 4.8% annual risk of stroke. The patient's score is based upon: CHF History: 0 HTN History: 0 Diabetes History: 0 Stroke History: 2 Vascular Disease History: 1 Age Score: 1 Gender Score: 0    Code Status: Full Code  Severity of Illness: The appropriate patient status for this patient is INPATIENT. Inpatient status is judged to be reasonable and necessary in order to provide the required intensity of service to ensure the patient's safety. The patient's presenting symptoms, physical exam findings, and initial radiographic and laboratory data in the context of their chronic comorbidities is felt to place them at high risk for further clinical deterioration. Furthermore, it is not anticipated that the patient will be medically stable for discharge from the hospital within 2 midnights of admission.   * I certify that at the  point of admission it is my clinical judgment that the patient will require inpatient hospital care spanning beyond 2 midnights from the point of admission due to high intensity of service, high risk for further deterioration and high frequency of surveillance required.*   For questions or updates, please contact [facility] Please consult [email or web address] for contact info under     Signed, Arabella Merles, PA-C  09/21/2023 3:51 PM

## 2023-09-21 NOTE — Consult Note (Signed)
 Cardiology Consultation   Patient ID: Levi Davis MRN: [id number]; DOB: October 07, 1955  Admit date: 09/21/2023 Date of Consult: 09/21/2023  PCP:  Shade Flood, MD   [facility] Providers Cardiologist:  Charlton Haws, MD  Electrophysiologist:  Will Jorja Loa, MD  {   Patient Profile:   Levi Davis is a 68 y.o. male with a hx of HLD, AFib, coronary Ca++, stroke, PVD (left ICA occlusion known Duplex 06/08/23 no significant right ICA dx), RBBB who is being seen 09/21/2023 for the evaluation of CHB at the request of Dr. Scharlene Gloss.  AFib/AAD hx AFib ablation 03/10/22 Multaq > stopped 2023 post ablation  History of Present Illness:   Mr. Cull with hx of baseline conduction system disease, RBBB, LAD, borderline 1st AVblock 202-263ms PR last couple EKGs, comes via EMS today after a near syncopal event. EMS by review of ER notes report he was found bradycardic, ECG c/w CHB > on arrival in sinus conducting 1:1   Cardiology consulted > in their review, asked EP on board.  On Eliquis for his AFib No nodal blocking agents  LABS K+ 4.3 BUN/Creat 24/0.97 HS trop 6 > 7 WBC 9.4 H/H 12/38 Plts 254  He reports feeling well, as usual of late, excellent exertional capacity, exercises very regularly, is very active, this morning her did his usual on his [place] bike, was at the table, having coffee with his wife when he started to feel a bit off, wife noticed and apparently seemed awake/did not lose tone/collapse, but wasn't responding > quickly after that happened again, this he does not remember but she was unable to wake him, quickly, he did come round and she was calling his name. Told him she was calling 911, and the event (s) were alarming enough to him, he did not disagree   Past Medical History:  Diagnosis Date   A-fib (HCC) 06/23/2018   Arrhythmia 2013   AFIB   Closed right ankle fracture    Gout    Hypercholesterolemia    Stroke [facility])     Past Surgical  History:  Procedure Laterality Date   ATRIAL FIBRILLATION ABLATION N/A 03/10/2022   Procedure: ATRIAL FIBRILLATION ABLATION;  Surgeon: Regan Lemming, MD;  Location: [facility] INVASIVE CV LAB;  Service: Cardiovascular;  Laterality: N/A;   broke leg     screws and pins     Home Medications:  Prior to Admission medications   Medication Sig Start Date End Date Taking? Authorizing Provider  Acetylcysteine (N-ACETYL-L-CYSTEINE PO) Take 1 tablet by mouth daily.    [provider]  ascorbic acid (VITAMIN C) 500 MG tablet Take 500 mg by mouth daily.    [provider]  Cholecalciferol (DIALYVITE VITAMIN D 5000) 125 MCG (5000 UT) capsule Take 5,000 Units by mouth daily.    [provider]  Coenzyme Q10 300 MG CAPS Take 300 mg by mouth daily.    [provider]  colchicine 0.6 MG tablet Take 1 tablet (0.6 mg total) by mouth daily. Start with 2 tabs (1.2 mg) once then 1 tab every 12 hours after 08/07/22   Ward, Tylene Fantasia, PA-C  Cyanocobalamin (B-12) 5000 MCG CAPS Take 5,000 mcg by mouth daily.    [provider]  ELIQUIS 5 MG TABS tablet TAKE 1 TABLET TWICE A DAY 11/16/22   Camnitz, Andree Coss, MD  L-ARGININE PO Take 1 tablet by mouth daily.    [provider]  NIACIN PO Take 640 mg by mouth daily.  [provider]  OVER THE COUNTER MEDICATION Take 1 tablet by mouth daily. arterial protect supplement    [provider]  OVER THE COUNTER MEDICATION Take 1 capsule by mouth daily. Super K    [provider]    Inpatient Medications: Scheduled Meds:  Continuous Infusions:  PRN Meds:   Allergies:   No Known Allergies  Social History:   Social History   Socioeconomic History   Marital status: Married    Spouse name: Not on file   Number of children: Not on file   Years of education: Not on file   Highest education level: Bachelor's degree (e.g., BA, AB, BS)  Occupational History   Not on file  Tobacco Use    Smoking status: Never   Smokeless tobacco: Never  Vaping Use   Vaping status: Never Used  Substance and Sexual Activity   Alcohol use: Yes    Comment: occasional   Drug use: Never   Sexual activity: Yes    Birth control/protection: None  Other Topics Concern   Not on file  Social History Narrative   Not on file   Social Drivers of Health   Financial Resource Strain: Low Risk  (05/05/2023)   Overall Financial Resource Strain (CARDIA)    Difficulty of Paying Living Expenses: Not hard at all  Food Insecurity: No Food Insecurity (05/05/2023)   Hunger Vital Sign    Worried About Running Out of Food in the Last Year: Never true    Ran Out of Food in the Last Year: Never true  Transportation Needs: No Transportation Needs (05/05/2023)   PRAPARE - Administrator, Civil Service (Medical): No    Lack of Transportation (Non-Medical): No  Physical Activity: Sufficiently Active (05/05/2023)   Exercise Vital Sign    Days of Exercise per Week: 6 days    Minutes of Exercise per Session: 50 min  Stress: No Stress Concern Present (05/05/2023)   [employer] of Occupational Health - Occupational Stress Questionnaire    Feeling of Stress : Not at all  Social Connections: Socially Integrated (05/05/2023)   Social Connection and Isolation Panel [NHANES]    Frequency of Communication with Friends and Family: More than three times a week    Frequency of Social Gatherings with Friends and Family: More than three times a week    Attends Religious Services: More than 4 times per year    Active Member of [employer] or Organizations: Yes    Attends Engineer, structural: More than 4 times per year    Marital Status: Married  Catering manager Violence: Not At Risk (10/13/2022)   Humiliation, Afraid, Rape, and Kick questionnaire    Fear of Current or Ex-Partner: No    Emotionally Abused: No    Physically Abused: No    Sexually Abused: No    Family History:   Family History  Problem  Relation Age of Onset   Early death Mother    COPD Mother    High blood pressure Mother    Anuerysm Mother    Stroke Mother    Heart disease Father    Early death Father    COPD Father    Heart Problems Father        PACER   High blood pressure Father    Obesity Sister        pacer   Aneurysm Sister    High blood pressure Sister      ROS:  Please see  the history of present illness.  All other ROS reviewed and negative.     Physical Exam/Data:   Vitals:   09/21/23 1038 09/21/23 1045 09/21/23 1330 09/21/23 1350  BP: (!) 158/66 (!) 150/60 122/65 (!) 144/66  Pulse: (!) 50 (!) 59 (!) 49 (!) 59  Resp: 10 17 (!) 22 12  Temp: 98.5 F (36.9 C)   98.3 F (36.8 C)  TempSrc: Oral     SpO2: 100% 100% 100% 100%  Weight:      Height:       No intake or output data in the 24 hours ending 09/21/23 1444    09/21/2023   10:37 AM 07/27/2023    8:35 AM 05/09/2023    2:58 PM  Last 3 Weights  Weight (lbs) 168 lb 172 lb 180 lb  Weight (kg) 76.204 kg 78.019 kg 81.647 kg     Body mass index is 22.78 kg/m.  General:  Well nourished, well developed, in no acute distress HEENT: normal Neck: no JVD Vascular: No carotid bruits; Distal pulses 2+ bilaterally Cardiac:  RRR; no murmurs, gallops or rubs Lungs:  , no wheezing, rhonchi or rales  Abd: soft, nontender, no hepatomegaly  Ext: no edema Musculoskeletal:  No deformities, BUE and BLE strength normal and equal Skin: warm and dry  Neuro:  CNs 2-12 intact, no focal abnormalities noted Psych:  Normal affect   EKG:  The EKG was personally reviewed and demonstrates:    SB 52bpm, RBBB, LAD, 1st degree AVblock  EMS tracings reviewed SR CHB 30's One ST (not PVCs/NSVT) > SR   Telemetry:  Telemetry was personally reviewed and demonstrates:   SR 50's, no further advanced heart block  Relevant CV Studies:  03/10/22 EPS/ablation PROCEDURES: 1. Comprehensive electrophysiologic study. 2. Coronary sinus pacing and recording. 3.  Three-dimensional mapping of atrial fibrillation (with additional mapping and ablation within the left atrium due to persistence of afib) 4. Ablation of atrial fibrillation (with additional mapping and ablation within the left atrium due to persistence of afib) 5. Intracardiac echocardiography. 6. Transseptal puncture of an intact septum. 7. Arrhythmia induction with pacing  Ca score 05/11/21 IMPRESSION: Coronary calcium score of 261. This was 25 th percentile for age-, race-, and sex-matched controls.   TTE  04/08/21  EF 60-65%  No valve disease Mild LAE 4.2 cm   Laboratory Data:  High Sensitivity Troponin:   Recent Labs  Lab 09/21/23 1042 09/21/23 1250  TROPONINIHS 6 7     Chemistry Recent Labs  Lab 09/21/23 1042  NA 137  K 4.3  CL 105  CO2 22  GLUCOSE 96  BUN 24*  CREATININE 0.97  CALCIUM 9.2  GFRNONAA >60  ANIONGAP 10    No results for input(s): "PROT", "ALBUMIN", "AST", "ALT", "ALKPHOS", "BILITOT" in the last 168 hours. Lipids No results for input(s): "CHOL", "TRIG", "HDL", "LABVLDL", "LDLCALC", "CHOLHDL" in the last 168 hours.  Hematology Recent Labs  Lab 09/21/23 1042  WBC 9.4  RBC 3.97*  HGB 12.6*  HCT 38.6*  MCV 97.2  MCH 31.7  MCHC 32.6  RDW 12.5  PLT 254   Thyroid No results for input(s): "TSH", "FREET4" in the last 168 hours.  BNPNo results for input(s): "BNP", "PROBNP" in the last 168 hours.  DDimer No results for input(s): "DDIMER" in the last 168 hours.   Radiology/Studies:  DG Chest Portable 1 View Result Date: 09/21/2023 CLINICAL DATA:  Near-syncope. EXAM: PORTABLE CHEST 1 VIEW COMPARISON:  Chest radiograph 04/07/2021 FINDINGS: The  heart size and mediastinal contours are within normal limits. Both lungs are clear. The visualized skeletal structures are unremarkable. IMPRESSION: No active disease. Electronically Signed   By: Annia Belt M.D.   On: 09/21/2023 11:44     Assessment and Plan:   CHB On top of conduction system  disease No reversible causes Will need a pacer Echo has been ordered  He took his Eliquis this morning >> will look to implant tomorrow SCD's   Risk Assessment/Risk Scores:    For questions or updates, please contact [facility] Please consult [email or web address] for contact info under    Signed, Sheilah Pigeon, PA-C  09/21/2023 2:44 PM

## 2023-09-21 NOTE — ED Triage Notes (Signed)
 Pt coming in from home pt had an episode of near syncope. Pt was eating breakfast this morning when he had the episode called ems out was found to be hr of 30 idioventricular. Pt has had a stable bp for ems. Pt denies chest pain. Pt has a history of afib and has had an ablation.  Pt got 300 cc NS.   Ems vitals 148/74 Hr 60  Spo2 97 Cbg 140

## 2023-09-21 NOTE — ED Provider Notes (Signed)
 [place] [facility]GENCY DEPARTMENT AT [facility] Provider Note   CSN: [id number] Arrival date & time: 09/21/23  1030     History {Add pertinent medical, surgical, social history, OB history to HPI:1} Chief Complaint  Patient presents with   Bradycardia    Levi Davis is a 68 y.o. male.  HPI   Patient presented to the ED for evaluation of a near syncopal episode.  Patient has history of arrhythmia hypercholesterolemia A-fib and is post ablation, stroke.  Patient states he felt fine when he woke up today.  He was able to exercise.  He was sitting down and eating breakfast when his wife noticed that he was not acting right and seemed to be staring.  Patient indicated that he felt very lightheaded as if he was going to pass out.  This lasted a few seconds.  Patient felt that maybe something was going on with his heart although he was not having any trouble with any chest pain or shortness of breath.  He felt that if he tried to stand up he was going to pass out.  Patient states he had another episode that lasted briefly again.  EMS was called.  During transport they noted episodes of bradycardia as well as multiple PVCs.  Patient right now states he feels fine he is not having any chest pain or shortness of breath.  No fevers.  He is not feeling lightheaded at this time.   Home Medications Prior to Admission medications   Medication Sig Start Date End Date Taking? Authorizing Provider  Acetylcysteine (N-ACETYL-L-CYSTEINE PO) Take 1 tablet by mouth daily.    [provider]  ascorbic acid (VITAMIN C) 500 MG tablet Take 500 mg by mouth daily.    [provider]  Cholecalciferol (DIALYVITE VITAMIN D 5000) 125 MCG (5000 UT) capsule Take 5,000 Units by mouth daily.    [provider]  Coenzyme Q10 300 MG CAPS Take 300 mg by mouth daily.    [provider]  colchicine 0.6 MG tablet Take 1 tablet (0.6 mg total) by mouth daily. Start with 2 tabs (1.2 mg)  once then 1 tab every 12 hours after 08/07/22   Ward, Tylene Fantasia, PA-C  Cyanocobalamin (B-12) 5000 MCG CAPS Take 5,000 mcg by mouth daily.    [provider]  ELIQUIS 5 MG TABS tablet TAKE 1 TABLET TWICE A DAY 11/16/22   Camnitz, Andree Coss, MD  L-ARGININE PO Take 1 tablet by mouth daily.    [provider]  NIACIN PO Take 640 mg by mouth daily.    [provider]  OVER THE COUNTER MEDICATION Take 1 tablet by mouth daily. arterial protect supplement    [provider]  OVER THE COUNTER MEDICATION Take 1 capsule by mouth daily. Super K    [provider]      Allergies    Patient has no known allergies.    Review of Systems   Review of Systems  Physical Exam Updated Vital Signs BP (!) 158/66 (BP Location: Left Arm)   Pulse (!) 50   Temp 98.5 F (36.9 C) (Oral)   Resp 10   Ht 1.829 m (6')   Wt 76.2 kg   SpO2 100%   BMI 22.78 kg/m  Physical Exam Vitals and nursing note reviewed.  Constitutional:      General: He is not in acute distress.    Appearance: He is well-developed.  HENT:     Head: Normocephalic and atraumatic.  Right Ear: External ear normal.     Left Ear: External ear normal.  Eyes:     General: No scleral icterus.       Right eye: No discharge.        Left eye: No discharge.     Conjunctiva/sclera: Conjunctivae normal.  Neck:     Trachea: No tracheal deviation.  Cardiovascular:     Rate and Rhythm: Normal rate and regular rhythm.  Pulmonary:     Effort: Pulmonary effort is normal. No respiratory distress.     Breath sounds: Normal breath sounds. No stridor. No wheezing or rales.  Abdominal:     General: Bowel sounds are normal. There is no distension.     Palpations: Abdomen is soft.     Tenderness: There is no abdominal tenderness. There is no guarding or rebound.  Musculoskeletal:        General: No tenderness or deformity.     Cervical back: Neck supple.  Skin:    General: Skin is warm and dry.      Findings: No rash.  Neurological:     General: No focal deficit present.     Mental Status: He is alert.     Cranial Nerves: No cranial nerve deficit, dysarthria or facial asymmetry.     Sensory: No sensory deficit.     Motor: No abnormal muscle tone or seizure activity.     Coordination: Coordination normal.  Psychiatric:        Mood and Affect: Mood normal.     ED Results / Procedures / Treatments   Labs (all labs ordered are listed, but only abnormal results are displayed) Labs Reviewed  CBG MONITORING, ED - Abnormal; Notable for the following components:      Result Value   Glucose-Capillary 104 (*)    All other components within normal limits  BASIC METABOLIC PANEL  CBC  TROPONIN I (HIGH SENSITIVITY)    EKG EKG Interpretation Date/Time:  Wednesday September 21 2023 10:38:47 EDT Ventricular Rate:  52 PR Interval:  224 QRS Duration:  149 QT Interval:  482 QTC Calculation: 449 R Axis:   -60  Text Interpretation: Sinus rhythm Prolonged PR interval Probable left atrial enlargement RBBB and LAFB Left ventricular hypertrophy No significant change since last tracing Confirmed by Linwood Dibbles [phone]) on 09/21/2023 10:50:38 AM  Radiology No results found.  Procedures Procedures  {Document cardiac monitor, telemetry assessment procedure when appropriate:1}  Medications Ordered in ED Medications - No data to display  ED Course/ Medical Decision Making/ A&P Clinical Course as of 09/21/23 1301  Wed Sep 21, 2023  1138 CBC(!) Hemoglobin slightly decreased compared to previous [JK]  1159 Metabolic panel normal.  Troponin normal [JK]  1200 Chest x-ray without acute finding [JK]  1222 Case discussed with Rosann Auerbach, cardiology.  Cardiology will come see pt in the ED. [JK]    Clinical Course User Index [JK] Linwood Dibbles, MD   {   Click here for ABCD2, HEART and other calculatorsREFRESH Note before signing :1}                              Medical Decision Making Amount and/or  Complexity of Data Reviewed Labs: ordered. Decision-making details documented in ED Course. Radiology: ordered and independent interpretation performed.  Risk Decision regarding hospitalization.   Pt presented to the ED for evaluation of near syncope.  Pt noted to have episodes of bradycardia in the 30s on  EKG by EMS.  Currently back in sinus rhythm.  Labs otherwise unremarkable.    {Document critical care time when appropriate:1} {Document review of labs and clinical decision tools ie heart score, Chads2Vasc2 etc:1}  {Document your independent review of radiology images, and any outside records:1} {Document your discussion with family members, caretakers, and with consultants:1} {Document social determinants of health affecting pt's care:1} {Document your decision making why or why not admission, treatments were needed:1} Final Clinical Impression(s) / ED Diagnoses Final diagnoses:  None    Rx / DC Orders ED Discharge Orders     None

## 2023-09-22 ENCOUNTER — Encounter (HOSPITAL_COMMUNITY): Payer: Self-pay | Admitting: Cardiology

## 2023-09-22 ENCOUNTER — Inpatient Hospital Stay (HOSPITAL_COMMUNITY)

## 2023-09-22 ENCOUNTER — Inpatient Hospital Stay (HOSPITAL_COMMUNITY)
Admission: EM | Disposition: A | Discharge status: Home / Self Care | Payer: Self-pay | Source: Home / Self Care | Attending: Cardiovascular Disease

## 2023-09-22 DIAGNOSIS — I442 Atrioventricular block, complete: Secondary | ICD-10-CM

## 2023-09-22 HISTORY — PX: PACEMAKER IMPLANT: EP1218

## 2023-09-22 LAB — ECHOCARDIOGRAM COMPLETE
AR max vel: 3.48 cm2
AV Area VTI: 3.08 cm2
AV Area mean vel: 3.15 cm2
AV Mean grad: 4 mmHg
AV Peak grad: 6.7 mmHg
Ao pk vel: 1.29 m/s
Area-P 1/2: 1.74 cm2
Calc EF: 53.2 %
Height: 72 in
MV VTI: 2.56 cm2
S' Lateral: 3.5 cm
Single Plane A2C EF: 53 %
Single Plane A4C EF: 50.7 %
Weight: 2688 [oz_av]

## 2023-09-22 LAB — BASIC METABOLIC PANEL WITH GFR
Anion gap: 5 (ref 5–15)
BUN: 21 mg/dL (ref 8–23)
CO2: 26 mmol/L (ref 22–32)
Calcium: 9.2 mg/dL (ref 8.9–10.3)
Chloride: 111 mmol/L (ref 98–111)
Creatinine, Ser: 1.14 mg/dL (ref 0.61–1.24)
GFR, Estimated: >60 mL/min (ref >60)
Glucose, Bld: 103 mg/dL — ABNORMAL HIGH (ref 70–99)
Potassium: 5.5 mmol/L — ABNORMAL HIGH (ref 3.5–5.1)
Sodium: 142 mmol/L (ref 135–145)

## 2023-09-22 LAB — SURGICAL PCR SCREEN
MRSA, PCR: NEGATIVE
Staphylococcus aureus: NEGATIVE

## 2023-09-22 SURGERY — PACEMAKER IMPLANT

## 2023-09-22 MED ORDER — MIDAZOLAM HCL 5 MG/5ML IJ SOLN
INTRAMUSCULAR | Status: AC
  Filled 2023-09-22: qty 5

## 2023-09-22 MED ORDER — FENTANYL CITRATE (PF) 100 MCG/2ML IJ SOLN
INTRAMUSCULAR | Status: DC | PRN
  Administered 2023-09-22 (×2): 50 ug via INTRAVENOUS

## 2023-09-22 MED ORDER — SODIUM CHLORIDE 0.9 % IV SOLN
INTRAVENOUS | Status: AC
  Filled 2023-09-22: qty 2

## 2023-09-22 MED ORDER — FENTANYL CITRATE (PF) 100 MCG/2ML IJ SOLN
INTRAMUSCULAR | Status: AC
  Filled 2023-09-22: qty 2

## 2023-09-22 MED ORDER — CEFAZOLIN SODIUM-DEXTROSE 2-4 GM/100ML-% IV SOLN
INTRAVENOUS | Status: AC
  Filled 2023-09-22: qty 100

## 2023-09-22 MED ORDER — LIDOCAINE HCL (PF) 1 % IJ SOLN
INTRAMUSCULAR | Status: DC | PRN
  Administered 2023-09-22: 50 mL

## 2023-09-22 MED ORDER — MIDAZOLAM HCL 5 MG/5ML IJ SOLN
INTRAMUSCULAR | Status: DC | PRN
Start: 2023-09-22 — End: 2023-09-22
  Administered 2023-09-22 (×2): 1 mg via INTRAVENOUS

## 2023-09-22 MED ORDER — HEPARIN (PORCINE) IN NACL 1000-0.9 UT/500ML-% IV SOLN
INTRAVENOUS | Status: DC | PRN
  Administered 2023-09-22: 500 mL

## 2023-09-22 MED ORDER — LIDOCAINE HCL 1 % IJ SOLN
INTRAMUSCULAR | Status: AC
  Filled 2023-09-22: qty 60

## 2023-09-22 SURGICAL SUPPLY — 12 items

## 2023-09-22 NOTE — H&P (View-Only) (Signed)
 Rounding Note    Patient Name: Levi Davis Date of Encounter: 09/22/2023  [facility] Cardiologist: Charlton Haws, MD   Subjective   Feels well, no recurrent symptoms  Inpatient Medications    Scheduled Meds:  chlorhexidine  60 mL Topical Once   gentamicin (GARAMYCIN) 80 mg in sodium chloride 0.9 % 500 mL irrigation  80 mg Irrigation On Call   niacin  500 mg Oral QHS   sodium chloride flush  3-10 mL Intravenous Q12H   Continuous Infusions:   ceFAZolin (ANCEF) IV     PRN Meds: acetaminophen, hydrALAZINE, ondansetron (ZOFRAN) IV, sodium chloride flush   Vital Signs    Vitals:   09/21/23 1635 09/21/23 2039 09/21/23 2309 09/22/23 0455  BP: (!) 144/66 101/65 116/63 (!) 115/59  Pulse: (!) 52 64 (!) 54 (!) 58  Resp: 18 19 18 16   Temp: 98 F (36.7 C) 98 F (36.7 C) 97.7 F (36.5 C) 97.8 F (36.6 C)  TempSrc: Oral Oral Oral Oral  SpO2:  100% 99% 99%  Weight:      Height:        Intake/Output Summary (Last 24 hours) at 09/22/2023 0843 Last data filed at 09/21/2023 2100 Gross per 24 hour  Intake 240 ml  Output --  Net 240 ml      09/21/2023   10:37 AM 07/27/2023    8:35 AM 05/09/2023    2:58 PM  Last 3 Weights  Weight (lbs) 168 lb 172 lb 180 lb  Weight (kg) 76.204 kg 78.019 kg 81.647 kg      Telemetry    SR/SB 50's mostly >> 70's - Personally Reviewed  ECG    No new EKGs - Personally Reviewed  Physical Exam   GEN: No acute distress.   Neck: No JVD Cardiac: RRR, no murmurs, rubs, or gallops.  Respiratory: CTA b/l. GI: Soft, nontender, non-distended  MS: No edema; No deformity. Neuro:  Nonfocal  Psych: Normal affect   Labs    High Sensitivity Troponin:   Recent Labs  Lab 09/21/23 1042 09/21/23 1250  TROPONINIHS 6 7     Chemistry Recent Labs  Lab 09/21/23 1042 09/21/23 1250 09/22/23 0451  NA 137  --  142  K 4.3  --  5.5*  CL 105  --  111  CO2 22  --  26  GLUCOSE 96  --  103*  BUN 24*  --  21  CREATININE 0.97  --  1.14   CALCIUM 9.2  --  9.2  MG  --  2.1  --   GFRNONAA >60  --  >60  ANIONGAP 10  --  5    Lipids No results for input(s): "CHOL", "TRIG", "HDL", "LABVLDL", "LDLCALC", "CHOLHDL" in the last 168 hours.  Hematology Recent Labs  Lab 09/21/23 1042  WBC 9.4  RBC 3.97*  HGB 12.6*  HCT 38.6*  MCV 97.2  MCH 31.7  MCHC 32.6  RDW 12.5  PLT 254   Thyroid  Recent Labs  Lab 09/21/23 1250  TSH 0.750    BNPNo results for input(s): "BNP", "PROBNP" in the last 168 hours.  DDimer No results for input(s): "DDIMER" in the last 168 hours.   Radiology    DG Chest Portable 1 View Result Date: 09/21/2023 CLINICAL DATA:  Near-syncope. EXAM: PORTABLE CHEST 1 VIEW COMPARISON:  Chest radiograph 04/07/2021 FINDINGS: The heart size and mediastinal contours are within normal limits. Both lungs are clear. The visualized skeletal structures are unremarkable. IMPRESSION: No active  disease. Electronically Signed   By: Annia Belt M.D.   On: 09/21/2023 11:44    Cardiac Studies   Echo is in progress  03/10/22 EPS/ablation PROCEDURES: 1. Comprehensive electrophysiologic study. 2. Coronary sinus pacing and recording. 3. Three-dimensional mapping of atrial fibrillation (with additional mapping and ablation within the left atrium due to persistence of afib) 4. Ablation of atrial fibrillation (with additional mapping and ablation within the left atrium due to persistence of afib) 5. Intracardiac echocardiography. 6. Transseptal puncture of an intact septum. 7. Arrhythmia induction with pacing   Ca score 05/11/21 IMPRESSION: Coronary calcium score of 261. This was 65 th percentile for age-, race-, and sex-matched controls.   TTE  04/08/21  EF 60-65%  No valve disease Mild LAE 4.2 cm   Patient Profile     68 y.o. male HLD, AFib, coronary Ca++, stroke, PVD (left ICA occlusion known Duplex 06/08/23 no significant right ICA dx), RBBB  Admitted with near syncope, transient CHB noted by EMS  AFib/AAD  hx AFib ablation 03/10/22 Multaq > stopped 2023 post ablation  Assessment & Plan    CHB On top of conduction system disease No reversible causes Planned for today (had taken his Eliquis yesterday AM)  Echo is in progress  2. HLD 3. PVD/carotid disease Has declined statin tx Deferred to Dr. Eden Emms    For questions or updates, please contact [facility] Please consult [email or web address] for contact info under        Signed, Sheilah Pigeon, PA-C  09/22/2023, 8:43 AM    I have seen, examined the patient, and reviewed the above assessment and plan.    Interval: No acute overnight events. Patient reports feeling relatively well this morning. No new or acute complaints.   GEN: No acute distress.   Cardiac: Normal rate, regular rhythm.  Resp: Normal work of breathing.  Ext: No edema.  Psych: Normal affect   Assessment: Levi Davis is a 68 y.o. male with a hx of HLD, AFib, coronary Ca++, stroke, PVD (left ICA occlusion known Duplex 06/08/23 no significant right ICA dx) who presented with near syncope. He was found to have intermittent complete heart block. He has significant conduction disease at baseline with RBBB, LAFB and 1st degree AV delay. Patient meets criteria for permanent pacemaker implant.   Problem List:  Intermittent complete heart block Symptomatic bradycardia Baseline RBBB, LAFB with first degree AV delay  Plan:  Explained risks, benefits, and alternatives to pacemaker implantation, including but not limited to bleeding, infection, damage to heart or lungs, heart attack, stroke, or death.  Pt verbalized understanding and agrees to proceed.  [name], MD 09/22/2023 3:32 PM

## 2023-09-22 NOTE — Plan of Care (Addendum)
   Problem: Education: Goal: Knowledge of General Education information will improve Description Including pain rating scale, medication(s)/side effects and non-pharmacologic comfort measures Outcome: Progressing

## 2023-09-22 NOTE — Progress Notes (Addendum)
 Rounding Note    Patient Name: Levi Davis Date of Encounter: 09/22/2023  [facility] Cardiologist: Charlton Haws, MD   Subjective   Feels well, no recurrent symptoms  Inpatient Medications    Scheduled Meds:  chlorhexidine  60 mL Topical Once   gentamicin (GARAMYCIN) 80 mg in sodium chloride 0.9 % 500 mL irrigation  80 mg Irrigation On Call   niacin  500 mg Oral QHS   sodium chloride flush  3-10 mL Intravenous Q12H   Continuous Infusions:   ceFAZolin (ANCEF) IV     PRN Meds: acetaminophen, hydrALAZINE, ondansetron (ZOFRAN) IV, sodium chloride flush   Vital Signs    Vitals:   09/21/23 1635 09/21/23 2039 09/21/23 2309 09/22/23 0455  BP: (!) 144/66 101/65 116/63 (!) 115/59  Pulse: (!) 52 64 (!) 54 (!) 58  Resp: 18 19 18 16   Temp: 98 F (36.7 C) 98 F (36.7 C) 97.7 F (36.5 C) 97.8 F (36.6 C)  TempSrc: Oral Oral Oral Oral  SpO2:  100% 99% 99%  Weight:      Height:        Intake/Output Summary (Last 24 hours) at 09/22/2023 0843 Last data filed at 09/21/2023 2100 Gross per 24 hour  Intake 240 ml  Output --  Net 240 ml      09/21/2023   10:37 AM 07/27/2023    8:35 AM 05/09/2023    2:58 PM  Last 3 Weights  Weight (lbs) 168 lb 172 lb 180 lb  Weight (kg) 76.204 kg 78.019 kg 81.647 kg      Telemetry    SR/SB 50's mostly >> 70's - Personally Reviewed  ECG    No new EKGs - Personally Reviewed  Physical Exam   GEN: No acute distress.   Neck: No JVD Cardiac: RRR, no murmurs, rubs, or gallops.  Respiratory: CTA b/l. GI: Soft, nontender, non-distended  MS: No edema; No deformity. Neuro:  Nonfocal  Psych: Normal affect   Labs    High Sensitivity Troponin:   Recent Labs  Lab 09/21/23 1042 09/21/23 1250  TROPONINIHS 6 7     Chemistry Recent Labs  Lab 09/21/23 1042 09/21/23 1250 09/22/23 0451  NA 137  --  142  K 4.3  --  5.5*  CL 105  --  111  CO2 22  --  26  GLUCOSE 96  --  103*  BUN 24*  --  21  CREATININE 0.97  --  1.14   CALCIUM 9.2  --  9.2  MG  --  2.1  --   GFRNONAA >60  --  >60  ANIONGAP 10  --  5    Lipids No results for input(s): "CHOL", "TRIG", "HDL", "LABVLDL", "LDLCALC", "CHOLHDL" in the last 168 hours.  Hematology Recent Labs  Lab 09/21/23 1042  WBC 9.4  RBC 3.97*  HGB 12.6*  HCT 38.6*  MCV 97.2  MCH 31.7  MCHC 32.6  RDW 12.5  PLT 254   Thyroid  Recent Labs  Lab 09/21/23 1250  TSH 0.750    BNPNo results for input(s): "BNP", "PROBNP" in the last 168 hours.  DDimer No results for input(s): "DDIMER" in the last 168 hours.   Radiology    DG Chest Portable 1 View Result Date: 09/21/2023 CLINICAL DATA:  Near-syncope. EXAM: PORTABLE CHEST 1 VIEW COMPARISON:  Chest radiograph 04/07/2021 FINDINGS: The heart size and mediastinal contours are within normal limits. Both lungs are clear. The visualized skeletal structures are unremarkable. IMPRESSION: No active  disease. Electronically Signed   By: Annia Belt M.D.   On: 09/21/2023 11:44    Cardiac Studies   Echo is in progress  03/10/22 EPS/ablation PROCEDURES: 1. Comprehensive electrophysiologic study. 2. Coronary sinus pacing and recording. 3. Three-dimensional mapping of atrial fibrillation (with additional mapping and ablation within the left atrium due to persistence of afib) 4. Ablation of atrial fibrillation (with additional mapping and ablation within the left atrium due to persistence of afib) 5. Intracardiac echocardiography. 6. Transseptal puncture of an intact septum. 7. Arrhythmia induction with pacing   Ca score 05/11/21 IMPRESSION: Coronary calcium score of 261. This was 65 th percentile for age-, race-, and sex-matched controls.   TTE  04/08/21  EF 60-65%  No valve disease Mild LAE 4.2 cm   Patient Profile     68 y.o. male HLD, AFib, coronary Ca++, stroke, PVD (left ICA occlusion known Duplex 06/08/23 no significant right ICA dx), RBBB  Admitted with near syncope, transient CHB noted by EMS  AFib/AAD  hx AFib ablation 03/10/22 Multaq > stopped 2023 post ablation  Assessment & Plan    CHB On top of conduction system disease No reversible causes Planned for today (had taken his Eliquis yesterday AM)  Echo is in progress  2. HLD 3. PVD/carotid disease Has declined statin tx Deferred to Dr. Eden Emms    For questions or updates, please contact [facility] Please consult [email or web address] for contact info under        Signed, Sheilah Pigeon, PA-C  09/22/2023, 8:43 AM    I have seen, examined the patient, and reviewed the above assessment and plan.    Interval: No acute overnight events. Patient reports feeling relatively well this morning. No new or acute complaints.   GEN: No acute distress.   Cardiac: Normal rate, regular rhythm.  Resp: Normal work of breathing.  Ext: No edema.  Psych: Normal affect   Assessment: Levi Davis is a 68 y.o. male with a hx of HLD, AFib, coronary Ca++, stroke, PVD (left ICA occlusion known Duplex 06/08/23 no significant right ICA dx) who presented with near syncope. He was found to have intermittent complete heart block. He has significant conduction disease at baseline with RBBB, LAFB and 1st degree AV delay. Patient meets criteria for permanent pacemaker implant.   Problem List:  Intermittent complete heart block Symptomatic bradycardia Baseline RBBB, LAFB with first degree AV delay  Plan:  Explained risks, benefits, and alternatives to pacemaker implantation, including but not limited to bleeding, infection, damage to heart or lungs, heart attack, stroke, or death.  Pt verbalized understanding and agrees to proceed.  [name], MD 09/22/2023 3:32 PM

## 2023-09-22 NOTE — Interval H&P Note (Signed)
 History and Physical Interval Note:  09/22/2023 3:37 PM  Levi Davis  has presented today for surgery, with the diagnosis of complete heart block.  The various methods of treatment have been discussed with the patient and family. After consideration of risks, benefits and other options for treatment, the patient has consented to  Procedure(s): PACEMAKER IMPLANT (N/A) as a surgical intervention.  The patient's history has been reviewed, patient examined, no change in status, stable for surgery.  I have reviewed the patient's chart and labs.  Questions were answered to the patient's satisfaction.     Levi Davis

## 2023-09-23 ENCOUNTER — Inpatient Hospital Stay (HOSPITAL_COMMUNITY)

## 2023-09-23 LAB — BASIC METABOLIC PANEL WITH GFR
Anion gap: 12 (ref 5–15)
BUN: 21 mg/dL (ref 8–23)
CO2: 22 mmol/L (ref 22–32)
Calcium: 9.2 mg/dL (ref 8.9–10.3)
Chloride: 106 mmol/L (ref 98–111)
Creatinine, Ser: 0.91 mg/dL (ref 0.61–1.24)
GFR, Estimated: >60 mL/min (ref >60)
Glucose, Bld: 92 mg/dL (ref 70–99)
Potassium: 4.1 mmol/L (ref 3.5–5.1)
Sodium: 140 mmol/L (ref 135–145)

## 2023-09-23 MED FILL — Gentamicin Sulfate Inj 40 MG/ML: INTRAMUSCULAR | Qty: 80 | Status: AC

## 2023-09-23 MED FILL — Cefazolin Sodium-Dextrose IV Solution 2 GM/100ML-4%: INTRAVENOUS | Qty: 100 | Status: AC

## 2023-09-23 NOTE — Plan of Care (Signed)
  Problem: Education: Goal: Knowledge of General Education information will improve Description: Including pain rating scale, medication(s)/side effects and non-pharmacologic comfort measures Outcome: Adequate for Discharge   Problem: Health Behavior/Discharge Planning: Goal: Ability to manage health-related needs will improve Outcome: Adequate for Discharge   Problem: Clinical Measurements: Goal: Ability to maintain clinical measurements within normal limits will improve Outcome: Adequate for Discharge Goal: Will remain free from infection Outcome: Adequate for Discharge Goal: Diagnostic test results will improve Outcome: Adequate for Discharge Goal: Respiratory complications will improve Outcome: Adequate for Discharge Goal: Cardiovascular complication will be avoided Outcome: Adequate for Discharge   Problem: Activity: Goal: Risk for activity intolerance will decrease Outcome: Adequate for Discharge   Problem: Nutrition: Goal: Adequate nutrition will be maintained Outcome: Adequate for Discharge   Problem: Coping: Goal: Level of anxiety will decrease Outcome: Adequate for Discharge   Problem: Elimination: Goal: Will not experience complications related to bowel motility Outcome: Adequate for Discharge Goal: Will not experience complications related to urinary retention Outcome: Adequate for Discharge   Problem: Pain Managment: Goal: General experience of comfort will improve and/or be controlled Outcome: Adequate for Discharge   Problem: Safety: Goal: Ability to remain free from injury will improve Outcome: Adequate for Discharge   Problem: Skin Integrity: Goal: Risk for impaired skin integrity will decrease Outcome: Adequate for Discharge   Problem: Education: Goal: Knowledge of cardiac device and self-care will improve Outcome: Adequate for Discharge Goal: Ability to safely manage health related needs after discharge will improve Outcome: Adequate for  Discharge Goal: Individualized Educational Video(s) Outcome: Adequate for Discharge   Problem: Cardiac: Goal: Ability to achieve and maintain adequate cardiopulmonary perfusion will improve Outcome: Adequate for Discharge

## 2023-09-23 NOTE — Discharge Instructions (Addendum)
 After Your Pacemaker   You have a [employer] Pacemaker  ACTIVITY Do not lift your arm above shoulder height for 1 week after your procedure. After 7 days, you may progress as below.  You should remove your sling 24 hours after your procedure, unless otherwise instructed by your provider.     Friday September 30, 2023  Saturday October 01, 2023 Sunday October 02, 2023 Monday October 03, 2023   Do not lift, push, pull, or carry anything over 10 pounds with the affected arm until 6 weeks (Friday Nov 04, 2023 ) after your procedure.   You may drive AFTER your wound check, unless you have been told otherwise by your provider.   Ask your healthcare provider when you can go back to work   INCISION/Dressing If you are on a blood thinner such as Coumadin, Xarelto, Eliquis, Plavix, or Pradaxa please confirm with your provider when this should be resumed. Resume Eliquis on 09/25/23  If large square, outer bandage is left in place, this can be removed after 24 hours from your procedure. Do not remove steri-strips or glue as below.   If a PRESSURE DRESSING (a bulky dressing that usually goes up over your shoulder) was applied or left in place, please follow instructions given by your provider on when to return to have this removed.   Monitor your Pacemaker site for redness, swelling, and drainage. Call the device clinic at [phone] if you experience these symptoms or fever/chills.  If your incision is sealed with Steri-strips or staples, you may shower 7 days after your procedure or when told by your provider. Do not remove the steri-strips or let the shower hit directly on your site. You may wash around your site with soap and water.    If you were discharged in a sling, please do not wear this during the day more than 48 hours after your surgery unless otherwise instructed. This may increase the risk of stiffness and soreness in your shoulder.   Avoid lotions, ointments, or perfumes over your incision until  it is well-healed.  You may use a hot tub or a pool AFTER your wound check appointment if the incision is completely closed.  Pacemaker Alerts:  Some alerts are vibratory and others beep. These are NOT emergencies. Please call our office to let us know. If this occurs at night or on weekends, it can wait until the next business day. Send a remote transmission.  If your device is capable of reading fluid status (for heart failure), you will be offered monthly monitoring to review this with you.   DEVICE MANAGEMENT Remote monitoring is used to monitor your pacemaker from home. This monitoring is scheduled every 91 days by our office. It allows [place] to keep an eye on the functioning of your device to ensure it is working properly. You will routinely see your Electrophysiologist annually (more often if necessary).   You should receive your ID card for your new device in 4-8 weeks. Keep this card with you at all times once received. Consider wearing a medical alert bracelet or necklace.  Your Pacemaker may be MRI compatible. This will be discussed at your next office visit/wound check.  You should avoid contact with strong electric or magnetic fields.   Do not use amateur (ham) radio equipment or electric (arc) welding torches. MP3 player headphones with magnets should not be used. Some devices are safe to use if held at least 12 inches (30 cm) from your Pacemaker. These include  power tools, [employer], and speakers. If you are unsure if something is safe to use, ask your health care provider.  When using your cell phone, hold it to the ear that is on the opposite side from the Pacemaker. Do not leave your cell phone in a pocket over the Pacemaker.  You may safely use electric blankets, heating pads, computers, and microwave ovens.  Call the office right away if: You have chest pain. You feel more short of breath than you have felt before. You feel more light-headed than you have felt before. Your  incision starts to open up.  This information is not intended to replace advice given to you by your health care provider. Make sure you discuss any questions you have with your health care provider.

## 2023-09-23 NOTE — Discharge Summary (Addendum)
 DISCHARGE SUMMARY    Patient ID: Levi Davis,  MRN: [id number], DOB/AGE: 08/02/1955 68 y.o.  Admit date: 09/21/2023 Discharge date: 09/23/2023  Primary Care Physician:Greene, Asencion Partridge, MD  Electrophysiologist: new to Dr. Jimmey Ralph  Primary Discharge Diagnosis:  Near syncope Transient CHB  Secondary Discharge Diagnosis:  Paroxysmal Afib CHA2DS2Vasc is 3, on Eliquis Stroke (hx)  No Known Allergies   Procedures This Admission:  1.  Implantation of a MDT dual chamber PPM on 09/22/23 by Dr Jimmey Ralph.   There were no immediate post procedure complications. CXR on 09/23/23 demonstrated no pneumothorax status post device implantation.   Brief HPI: Levi Davis is a 68 y.o. male was admitted with recurrent near syncope, EMS found him to have transient CHB with rates 30's > he was admitted for further evaluation/work up  Hospital Course:  The patient was admitted labs were unremarkable, with no meds/reversible cause, planned for echo/PPM.  LVEF preserved, 50-55%, no significant VHD.  PPM delayed a day give his Eliquis the following day underwent implantation of a PPM with details as outlined in the procedure report.  He was monitored on telemetry overnight which demonstrated SB with intermittent atrial pacing.  Left chest was without hematoma or ecchymosis.  The device was interrogated and found to be functioning normally.  CXR was obtained and demonstrated no pneumothorax status post device implantation.  Wound care, arm mobility, and restrictions were reviewed with the patient.  The patient feels well, denies any CP/SOB, with minimal site discomfort.  He was examined by Dr. Jimmey Ralph and considered stable for discharge to home.   K+ this morning normal  Resume Eliquis 09/25/23   Physical Exam: Vitals:   09/22/23 1750 09/22/23 1754 09/22/23 2018 09/23/23 0635  BP:  125/71 123/69 123/68  Pulse:  (!) 51 68   Resp:  17 18 17   Temp:   97.9 F (36.6 C) 98.4 F (36.9 C)  TempSrc:    Oral Oral  SpO2: 99% 100% 98%   Weight:      Height:        GEN- The patient is well appearing, alert and oriented x 3 today.   HEENT: normocephalic, atraumatic; sclera clear, conjunctiva pink; hearing intact; oropharynx clear; neck supple, no JVP Lungs-  CTA b/l, normal work of breathing.  No wheezes, rales, rhonchi Heart- RRR, no murmurs, rubs or gallops, PMI not laterally displaced GI- soft, non-tender, non-distended Extremities- no clubbing, cyanosis, or edema MS- no significant deformity or atrophy Skin- warm and dry, no rash or lesion, left chest without hematoma/ecchymosis Psych- euthymic mood, full affect Neuro- no gross deficits   Labs:   Lab Results  Component Value Date   WBC 9.4 09/21/2023   HGB 12.6 (L) 09/21/2023   HCT 38.6 (L) 09/21/2023   MCV 97.2 09/21/2023   PLT 254 09/21/2023    Recent Labs  Lab 09/22/23 0451  NA 142  K 5.5*  CL 111  CO2 26  BUN 21  CREATININE 1.14  CALCIUM 9.2  GLUCOSE 103*    Discharge Medications:  Allergies as of 09/23/2023   No Known Allergies      Medication List     TAKE these medications    ascorbic acid 500 MG tablet Commonly known as: VITAMIN C Take 500 mg by mouth daily.   B-12 5000 MCG Caps Take 5,000 mcg by mouth daily.   colchicine 0.6 MG tablet Take 1 tablet (0.6 mg total) by mouth daily. Start with 2 tabs (1.2 mg)  once then 1 tab every 12 hours after   Dialyvite Vitamin D 5000 125 MCG (5000 UT) capsule Generic drug: Cholecalciferol Take 5,000 Units by mouth daily.   Eliquis 5 MG Tabs tablet Generic drug: apixaban TAKE 1 TABLET TWICE A DAY Notes to patient: Resume on 09/25/23 morning dose   L-ARGININE PO Take 1 tablet by mouth daily.   N-ACETYL-L-CYSTEINE PO Take 1 tablet by mouth daily.   NIACIN PO Take 1 tablet by mouth daily. Patient states its 640 mg per patient   OVER THE COUNTER MEDICATION Take 1 tablet by mouth daily. arterial protect supplement   OVER THE COUNTER  MEDICATION Take 1 capsule by mouth daily. Super K        Disposition: home Discharge Instructions     Diet - low sodium heart healthy   Complete by: As directed    Increase activity slowly   Complete by: As directed         Duration of Discharge Encounter: 15 minutes, APP time  Signed, Francis Dowse, PA-C 09/23/2023 8:50 AM  I have seen, examined the patient, and reviewed the above assessment and plan.    Hospital Course: Patient presented with symptomatic bradycardia and intermittent complete heart block.  He underwent dual-chamber permanent pacemaker implant on 09/22/2023.  He was monitored overnight with no acute complications.  He had chest x-ray this morning which showed appropriate lead position and no pneumothorax.  Bedside device interrogation was done which showed appropriate pacemaker function and stable lead parameters.  Usual discharge teaching was performed regarding activity restrictions and wound care.  He will resume his Eliquis on 09/25/2023.  GEN: No acute distress.   Cardiac: Normal rate, regular rhythm.  Left chest pacer pocket without hematoma or bleeding. Resp: Normal work of breathing.  Ext: No edema.  Psych: Normal affect   Duration of Discharge Encounter: 35 minutes, 20 minutes MD time  [name], MD 09/23/2023 10:02 PM

## 2023-09-26 ENCOUNTER — Emergency Department (HOSPITAL_COMMUNITY)

## 2023-09-26 ENCOUNTER — Emergency Department (HOSPITAL_COMMUNITY)
Admission: EM | Admit: 2023-09-26 | Discharge: 2023-09-26 | Disposition: A | Discharge status: Home / Self Care | Attending: Emergency Medicine | Admitting: Emergency Medicine

## 2023-09-26 ENCOUNTER — Encounter (HOSPITAL_COMMUNITY): Payer: Self-pay

## 2023-09-26 ENCOUNTER — Other Ambulatory Visit: Payer: Self-pay

## 2023-09-26 DIAGNOSIS — R072 Precordial pain: Secondary | ICD-10-CM | POA: Diagnosis not present

## 2023-09-26 DIAGNOSIS — I309 Acute pericarditis, unspecified: Secondary | ICD-10-CM | POA: Insufficient documentation

## 2023-09-26 DIAGNOSIS — Z950 Presence of cardiac pacemaker: Secondary | ICD-10-CM

## 2023-09-26 DIAGNOSIS — I308 Other forms of acute pericarditis: Secondary | ICD-10-CM

## 2023-09-26 DIAGNOSIS — I312 Hemopericardium, not elsewhere classified: Secondary | ICD-10-CM | POA: Insufficient documentation

## 2023-09-26 DIAGNOSIS — I480 Paroxysmal atrial fibrillation: Secondary | ICD-10-CM | POA: Diagnosis not present

## 2023-09-26 DIAGNOSIS — M109 Gout, unspecified: Secondary | ICD-10-CM

## 2023-09-26 DIAGNOSIS — R0789 Other chest pain: Secondary | ICD-10-CM | POA: Diagnosis present

## 2023-09-26 HISTORY — DX: Atrioventricular block, complete: I44.2

## 2023-09-26 HISTORY — DX: Atherosclerotic heart disease of native coronary artery without angina pectoris: I25.10

## 2023-09-26 LAB — CBC
HCT: 40.4 % (ref 39.0–52.0)
Hemoglobin: 13.7 g/dL (ref 13.0–17.0)
MCH: 32.3 pg (ref 26.0–34.0)
MCHC: 33.9 g/dL (ref 30.0–36.0)
MCV: 95.3 fL (ref 80.0–100.0)
Platelets: 240 10*3/uL (ref 150–400)
RBC: 4.24 MIL/uL (ref 4.22–5.81)
RDW: 12.6 % (ref 11.5–15.5)
WBC: 11 10*3/uL — ABNORMAL HIGH (ref 4.0–10.5)
nRBC: 0 % (ref 0.0–0.2)

## 2023-09-26 LAB — C-REACTIVE PROTEIN: CRP: 0.7 mg/dL (ref <1.0)

## 2023-09-26 LAB — BASIC METABOLIC PANEL WITH GFR
Anion gap: 8 (ref 5–15)
BUN: 22 mg/dL (ref 8–23)
CO2: 24 mmol/L (ref 22–32)
Calcium: 9.6 mg/dL (ref 8.9–10.3)
Chloride: 105 mmol/L (ref 98–111)
Creatinine, Ser: 0.99 mg/dL (ref 0.61–1.24)
GFR, Estimated: >60 mL/min (ref >60)
Glucose, Bld: 98 mg/dL (ref 70–99)
Potassium: 4.9 mmol/L (ref 3.5–5.1)
Sodium: 137 mmol/L (ref 135–145)

## 2023-09-26 LAB — TROPONIN I (HIGH SENSITIVITY)
Troponin I (High Sensitivity): 22 ng/L — ABNORMAL HIGH (ref <18)
Troponin I (High Sensitivity): 23 ng/L — ABNORMAL HIGH (ref <18)

## 2023-09-26 LAB — SEDIMENTATION RATE: Sed Rate: 26 mm/h — ABNORMAL HIGH (ref 0–16)

## 2023-09-26 LAB — D-DIMER, QUANTITATIVE: D-Dimer, Quant: 1.25 ug{FEU}/mL — ABNORMAL HIGH (ref 0.00–0.50)

## 2023-09-26 MED ORDER — IBUPROFEN 400 MG PO TABS
400.0000 mg | ORAL_TABLET | Freq: Once | ORAL | Status: AC
  Administered 2023-09-26: 400 mg via ORAL
  Filled 2023-09-26: qty 1

## 2023-09-26 MED ORDER — IOHEXOL 350 MG/ML SOLN
75.0000 mL | Freq: Once | INTRAVENOUS | Status: AC | PRN
  Administered 2023-09-26: 75 mL via INTRAVENOUS

## 2023-09-26 MED ORDER — COLCHICINE 0.6 MG PO TABS
0.6000 mg | ORAL_TABLET | Freq: Two times a day (BID) | ORAL | Status: DC
  Administered 2023-09-26: 0.6 mg via ORAL
  Filled 2023-09-26: qty 1

## 2023-09-26 MED ORDER — COLCHICINE 0.6 MG PO TABS: 0.6000 mg | ORAL_TABLET | Freq: Two times a day (BID) | ORAL | 0 refills | Status: DC

## 2023-09-26 MED ORDER — PANTOPRAZOLE SODIUM 40 MG PO TBEC
40.0000 mg | DELAYED_RELEASE_TABLET | Freq: Every day | ORAL | Status: DC
  Filled 2023-09-26: qty 1

## 2023-09-26 MED ORDER — IBUPROFEN 800 MG PO TABS: 800.0000 mg | ORAL_TABLET | Freq: Three times a day (TID) | ORAL | Status: DC

## 2023-09-26 NOTE — ED Notes (Signed)
 Patient transported to CT

## 2023-09-26 NOTE — Progress Notes (Addendum)
 CTPE report reviewed with Dr. Anne Fu on call this PM.  IMPRESSION: 1. No pulmonary embolism. 2. Left subclavian dual lead pacemaker in expected position. Small hemopericardium. 3. Moderate coronary artery calcification.  He personally reviewed the CT scan and feels the fluid collection is extremely small. Given the patient is hemodynamically stable and with mild symptoms he feels that he may go home with previously outlined plan for ibuprofen + PPI + colchicine but remain OFF Eliquis until cleared by outpatient team in follow-up. The patient's wife is a pediatric NP and has clinically seen tamponade in the past so shared her concerns about risk of worsening effusion with NSAIDS. Dr. Anne Fu spoke with them about concerns. He counseled them on trial of lower dose ibuprofen at 400mg  TID (taken with food) instead but also gave them then option to do colchicine on its own if they were really uncomfortable with the idea. He did at least recommend to go ahead and try one dose here, so we ordered ibuprofen 400mg  x1 + colchicine 0.6mg  + Protonix to be given prior to discharge from ER. I sent in rx for colchicine to [facility] in [place] and they will pick up tomorrow (but plan to get get first dose here prior to leaving). We also instructed him to take OTC omeprazole 20mg  daily if he does decide to trial the OTC ibuprofen. Instructions outlined on AVS. We also reiterated plan to keep off Eliquis for now.   I outlined med instructions and follow-up on AVS. We also discussed return precautions. EDP and nurse updated. We have arranged close 48 hour post ER follow-up with gen cards APP S. Weaver (EP did not have availability). APP will need to discuss med plan with EP attending in follow-up.

## 2023-09-26 NOTE — Discharge Instructions (Signed)
 You were seen for your chest pain in the emergency department.  You are found to have a small amount of fluid around your heart which is likely causing your symptoms.  This is likely related to the procedure that you had.  At home, please stop taking Eliquis.  You may take colchicine for your pain.  You may also take ibuprofen low-dose (400 mg) for any additional pain that you have.    Check your MyChart online for the results of any tests that had not resulted by the time you left the emergency department.   Follow-up with your primary doctor in 2-3 days regarding your visit.  Follow-up with cardiology soon as possible.  Return immediately to the emergency department if you experience any of the following: Worsening pain, fainting, dizziness, shortness of breath, or any other concerning symptoms.    Thank you for visiting our Emergency Department. It was a pleasure taking care of you today.

## 2023-09-26 NOTE — ED Triage Notes (Signed)
 Pt came in via POV d/t central CP that pt noticed began this morning, denies any pain radiation, does get worse with exertion. Pain is worse when he bends over or goes up stairs, some SOB with exertion as well. Does report having a pacemaker placed 4 days ago & had no problems till this morning. Does endorse feeling some chest tightness while sitting. A/Ox4, rates pain 2/10 during triage while sitting.

## 2023-09-26 NOTE — ED Provider Notes (Signed)
 [place] EMERGENCY DEPARTMENT AT [facility] Provider Note   CSN: [id number] Arrival date & time: 09/26/23  1425     History {Add pertinent medical, surgical, social history, OB history to HPI:1} Chief Complaint  Patient presents with   Chest Tightness    Levi Davis is a 68 y.o. male.  68 year old male with a history of atrial fibrillation, stroke, hyperlipidemia, and bradycardia with recent pacemaker placement who presents emergency department with chest pain.  Patient had a pacemaker placed on 09/22/2023.  Went home and was doing well until this morning when he woke up and started having substernal chest discomfort.  Describes it as 2/10 at rest.  Gets worse with exertion, deep breathing, and position.  No radiation.  No diaphoresis or vomiting.  Describes as tightness.  Has some mild shortness of breath as well but no cough.  Pacemaker site has been reportedly healing well.  Did have to pause his Eliquis for several days but resumed it yesterday.       Home Medications Prior to Admission medications   Medication Sig Start Date End Date Taking? Authorizing Provider  Acetylcysteine (N-ACETYL-L-CYSTEINE PO) Take 1 tablet by mouth daily.    [provider]  ascorbic acid (VITAMIN C) 500 MG tablet Take 500 mg by mouth daily.    [provider]  Cholecalciferol (DIALYVITE VITAMIN D 5000) 125 MCG (5000 UT) capsule Take 5,000 Units by mouth daily.    [provider]  colchicine 0.6 MG tablet Take 1 tablet (0.6 mg total) by mouth daily. Start with 2 tabs (1.2 mg) once then 1 tab every 12 hours after Patient not taking: Reported on 09/21/2023 08/07/22   Ward, Tylene Fantasia, PA-C  Cyanocobalamin (B-12) 5000 MCG CAPS Take 5,000 mcg by mouth daily.    [provider]  ELIQUIS 5 MG TABS tablet TAKE 1 TABLET TWICE A DAY 11/16/22   Camnitz, Andree Coss, MD  L-ARGININE PO Take 1 tablet by mouth daily.    [provider]  NIACIN PO Take 1 tablet  by mouth daily. Patient states its 640 mg per patient    [provider]  OVER THE COUNTER MEDICATION Take 1 tablet by mouth daily. arterial protect supplement    [provider]  OVER THE COUNTER MEDICATION Take 1 capsule by mouth daily. Super K    [provider]      Allergies    Patient has no known allergies.    Review of Systems   Review of Systems  Physical Exam Updated Vital Signs BP 124/78 (BP Location: Right Arm)   Pulse 72   Temp 98.4 F (36.9 C) (Oral)   Resp 14   Ht 6' (1.829 m)   Wt 75.3 kg   SpO2 100%   BMI 22.51 kg/m  Physical Exam Vitals and nursing note reviewed.  Constitutional:      General: He is not in acute distress.    Appearance: He is well-developed.  HENT:     Head: Normocephalic and atraumatic.     Right Ear: External ear normal.     Left Ear: External ear normal.     Nose: Nose normal.  Eyes:     Extraocular Movements: Extraocular movements intact.     Conjunctiva/sclera: Conjunctivae normal.     Pupils: Pupils are equal, round, and reactive to light.  Cardiovascular:     Rate and Rhythm: Normal rate and regular rhythm.     Heart sounds: Normal heart sounds.  Comments: Pacemaker implantation site left chest wall clean dry and intact Pulmonary:     Effort: Pulmonary effort is normal. No respiratory distress.     Breath sounds: Normal breath sounds.  Musculoskeletal:     Cervical back: Normal range of motion and neck supple.     Right lower leg: No edema.     Left lower leg: No edema.  Skin:    General: Skin is warm and dry.  Neurological:     Mental Status: He is alert. Mental status is at baseline.  Psychiatric:        Mood and Affect: Mood normal.        Behavior: Behavior normal.     ED Results / Procedures / Treatments   Labs (all labs ordered are listed, but only abnormal results are displayed) Labs Reviewed  CBC - Abnormal; Notable for the following components:      Result Value   WBC 11.0  (*)    All other components within normal limits  BASIC METABOLIC PANEL WITH GFR  D-DIMER, QUANTITATIVE  TROPONIN I (HIGH SENSITIVITY)    EKG None  Radiology No results found.  Procedures Procedures  {Document cardiac monitor, telemetry assessment procedure when appropriate:1}  Medications Ordered in ED Medications - No data to display  ED Course/ Medical Decision Making/ A&P   {   Click here for ABCD2, HEART and other calculatorsREFRESH Note before signing :1}                              Medical Decision Making Amount and/or Complexity of Data Reviewed Labs: ordered. Radiology: ordered.   ***  {Document critical care time when appropriate:1} {Document review of labs and clinical decision tools ie heart score, Chads2Vasc2 etc:1}  {Document your independent review of radiology images, and any outside records:1} {Document your discussion with family members, caretakers, and with consultants:1} {Document social determinants of health affecting pt's care:1} {Document your decision making why or why not admission, treatments were needed:1} Final Clinical Impression(s) / ED Diagnoses Final diagnoses:  None    Rx / DC Orders ED Discharge Orders     None

## 2023-09-26 NOTE — ED Notes (Signed)
 Called and placed PT on monitoring with CCMD

## 2023-09-26 NOTE — Consult Note (Signed)
 Cardiology Consultation   Patient ID: Levi Davis MRN: [id number]; DOB: Dec 29, 1955  Admit date: 09/26/2023 Date of Consult: 09/26/2023  PCP:  Shade Flood, MD   [facility] Providers Cardiologist:  Charlton Haws, MD  Electrophysiologist:  Will Jorja Loa, MD   Patient Profile:   Levi Davis is a 68 y.o. male with a hx of HLD, PAF on Eliquis, coronary artery calcification by calcium score, carotid artery disease with L ICA total occlusion and near-normal on the right (last duplex 05/2023), CVA, RBB, CHB s/p recent PPM, elevated coronary calcium score who is being seen 09/26/2023 for the evaluation of chest pain at the request of Dr. Eloise Harman.  History of Present Illness:   Levi Davis is a 68 year old male with above medical history.  Per chart review, patient was initially diagnosed with atrial fibrillation in [place] in 2013. He was not started on anticoagulation until he was admitted in 03/2021 for CVA. Echo showed normal EF. He was started on Eliquis. Underwent CT cardiac scoring in 04/2019 that showed a coronary calcium score of 261 (74th percentile). Patient refused statin therapy at that time. Pre-ablation coronary CT noted a CAC of 62.1 units (47%ile). He was was later referred to EP and underwent afib ablation in 02/2022 with Dr. Elberta Fortis. His Multaq was stopped, but he remained on Eliquis.   He was more recently admitted 3/26-3/28/25. He had presented to the ED after he had a near syncopal episode while eating breakfast. He felt that if he stood up, he would have lost consciousness. He had a second episode, so he decided to call EMS. During transport, he was noted to be bradycardiac with multiple PVCs. In the ED, EKG showed evidence of advanced conduction disease. Review of an EMS run strip revealed complete heart block.  Underwent echocardiogram 09/22/23 that showed EF 50-55%, no regional wall motion abnormalities, normal RV systolic function, mild MR. Patient was seen by  EP. After Eliquis washout, he underwent [employer] PPM implantation on 09/22/23 by Dr. Jimmey Ralph. On 3/28 he was deemed stable for discharge. Left chest was without hematoma or ecchymosis, device was functioning normally. He was instructed to resume Eliquis on 3/30. Of note, Dr. Eden Emms had also seen the patient back in January and recommended updated calcium score which is pending for next month.  He returned to the ED this afternoon noting chest pain that had started in the morning. In general he is very active (ie exercising on the peloton) without any angina or dyspnea. This morning he noticed a vague sense of mild chest pressure that would worsen to a pain sensation when he would go up stairs, bend over to tie his shoes or take a deep breath. When he would cease these specific actions the discomfort would return to a pressure. He also noticed he was more winded walking up stairs which was extremely unusual. Initial vital signs in the ED showed BP 124/78, oxygen 100% on room air, HR 72 BPM. Labs significant for hsTn 22 with pending repeat. K4.9, creatinine 0.99, WBC 11.0, hemoglobin 13.7, platelets 240. D-dimer 1.25. CXR showed no active cardiopulmonary disease. CTA PE study is pending. EKG shows NSR 73bpm with RBBB, LAFB, suspected LVH with secondary repolarization changes. He can still feel a vague sense of pressure. He denies any syncope, palpitations or issues with pacemaker.    Past Medical History:  Diagnosis Date   A-fib [facility]) 06/23/2018   Arrhythmia 2013   AFIB   Closed right ankle fracture  Complete heart block (HCC)    Coronary artery calcification    Gout    Hypercholesterolemia    Stroke [facility])     Past Surgical History:  Procedure Laterality Date   ATRIAL FIBRILLATION ABLATION N/A 03/10/2022   Procedure: ATRIAL FIBRILLATION ABLATION;  Surgeon: Regan Lemming, MD;  Location: [facility] INVASIVE CV LAB;  Service: Cardiovascular;  Laterality: N/A;   broke leg     screws and pins    PACEMAKER IMPLANT N/A 09/22/2023   Procedure: PACEMAKER IMPLANT;  Surgeon: [name], MD;  Location: [facility] INVASIVE CV LAB;  Service: Cardiovascular;  Laterality: N/A;     Home Medications:  Prior to Admission medications   Medication Sig Start Date End Date Taking? Authorizing Provider  Acetylcysteine (N-ACETYL-L-CYSTEINE PO) Take 1 tablet by mouth daily.    [provider]  ascorbic acid (VITAMIN C) 500 MG tablet Take 500 mg by mouth daily.    [provider]  Cholecalciferol (DIALYVITE VITAMIN D 5000) 125 MCG (5000 UT) capsule Take 5,000 Units by mouth daily.    [provider]  colchicine 0.6 MG tablet Take 1 tablet (0.6 mg total) by mouth daily. Start with 2 tabs (1.2 mg) once then 1 tab every 12 hours after Patient not taking: Reported on 09/21/2023 08/07/22   Ward, Tylene Fantasia, PA-C  Cyanocobalamin (B-12) 5000 MCG CAPS Take 5,000 mcg by mouth daily.    [provider]  ELIQUIS 5 MG TABS tablet TAKE 1 TABLET TWICE A DAY 11/16/22   Camnitz, Andree Coss, MD  L-ARGININE PO Take 1 tablet by mouth daily.    [provider]  NIACIN PO Take 1 tablet by mouth daily. Patient states its 640 mg per patient    [provider]  OVER THE COUNTER MEDICATION Take 1 tablet by mouth daily. arterial protect supplement    [provider]  OVER THE COUNTER MEDICATION Take 1 capsule by mouth daily. Super K    [provider]    Inpatient Medications: Scheduled Meds:  Continuous Infusions:  PRN Meds:   Allergies:   No Known Allergies  Social History:   Social History   Socioeconomic History   Marital status: Married    Spouse name: Not on file   Number of children: Not on file   Years of education: Not on file   Highest education level: Bachelor's degree (e.g., BA, AB, BS)  Occupational History   Not on file  Tobacco Use   Smoking status: Never   Smokeless tobacco: Never  Vaping Use   Vaping status: Never Used  Substance  and Sexual Activity   Alcohol use: Yes    Comment: occasional   Drug use: Never   Sexual activity: Yes    Birth control/protection: None  Other Topics Concern   Not on file  Social History Narrative   Not on file   Social Drivers of Health   Financial Resource Strain: Low Risk  (05/05/2023)   Overall Financial Resource Strain (CARDIA)    Difficulty of Paying Living Expenses: Not hard at all  Food Insecurity: No Food Insecurity (09/21/2023)   Hunger Vital Sign    Worried About Running Out of Food in the Last Year: Never true    Ran Out of Food in the Last Year: Never true  Transportation Needs: No Transportation Needs (09/21/2023)   PRAPARE - Administrator, Civil Service (Medical): No    Lack of Transportation (Non-Medical): No  Physical Activity: Sufficiently Active (05/05/2023)  Exercise Vital Sign    Days of Exercise per Week: 6 days    Minutes of Exercise per Session: 50 min  Stress: No Stress Concern Present (05/05/2023)   [employer] of Occupational Health - Occupational Stress Questionnaire    Feeling of Stress : Not at all  Social Connections: Socially Integrated (09/21/2023)   Social Connection and Isolation Panel [NHANES]    Frequency of Communication with Friends and Family: More than three times a week    Frequency of Social Gatherings with Friends and Family: More than three times a week    Attends Religious Services: More than 4 times per year    Active Member of [employer] or Organizations: Yes    Attends Engineer, structural: More than 4 times per year    Marital Status: Married  Catering manager Violence: Not At Risk (09/21/2023)   Humiliation, Afraid, Rape, and Kick questionnaire    Fear of Current or Ex-Partner: No    Emotionally Abused: No    Physically Abused: No    Sexually Abused: No    Family History:   Family History  Problem Relation Age of Onset   Early death Mother    COPD Mother    High blood pressure Mother    Anuerysm  Mother    Stroke Mother    Heart disease Father    Early death Father    COPD Father    Heart Problems Father        PACER   High blood pressure Father    Obesity Sister        pacer   Aneurysm Sister    High blood pressure Sister      ROS:  Please see the history of present illness.  All other ROS reviewed and negative.     Physical Exam/Data:   Vitals:   09/26/23 1503 09/26/23 1512  BP: 124/78   Pulse: 72   Resp: 14   Temp: 98.4 F (36.9 C)   TempSrc: Oral   SpO2: 100%   Weight:  75.3 kg  Height:  6' (1.829 m)   No intake or output data in the 24 hours ending 09/26/23 1810    09/26/2023    3:12 PM 09/21/2023   10:37 AM 07/27/2023    8:35 AM  Last 3 Weights  Weight (lbs) 166 lb 168 lb 172 lb  Weight (kg) 75.297 kg 76.204 kg 78.019 kg     Body mass index is 22.51 kg/m.  General: Well developed, well nourished, in no acute distress. Head: Normocephalic, atraumatic, sclera non-icteric, no xanthomas, nares are without discharge. Neck: Negative for carotid bruits. JVP not elevated. Lungs: Clear bilaterally to auscultation without wheezes, rales, or rhonchi. Breathing is unlabored. Heart: RRR S1 S2 without murmurs, rubs, or gallops.  Abdomen: Soft, non-tender, non-distended with normoactive bowel sounds. No rebound/guarding. Extremities: No clubbing or cyanosis. No edema. Distal pedal pulses are 2+ and equal bilaterally. Neuro: Alert and oriented X 3. Moves all extremities spontaneously. Psych:  Responds to questions appropriately with a normal affect.  EKG:  The EKG was personally reviewed and demonstrates:  NSR 73bpm with RBBB, LAFB, suspected LVH with secondary repolarization changes  Telemetry:  Telemetry was personally reviewed and demonstrates:  not currently on telemetry, have ordered  Relevant CV Studies: 2d echo 09/22/23    1. Left ventricular ejection fraction, by estimation, is 50 to 55%. The  left ventricle has low normal function. The left ventricle  has no regional  wall  motion abnormalities. Left ventricular diastolic parameters are  indeterminate.   2. Right ventricular systolic function is normal. The right ventricular  size is mildly enlarged.   3. Right atrial size was mildly dilated.   4. The mitral valve is normal in structure. Mild mitral valve  regurgitation. No evidence of mitral stenosis.   5. The aortic valve is normal in structure. Aortic valve regurgitation is  not visualized. No aortic stenosis is present.   6. The inferior vena cava is normal in size with greater than 50%  respiratory variability, suggesting right atrial pressure of 3 mmHg.   Laboratory Data:  High Sensitivity Troponin:   Recent Labs  Lab 09/21/23 1042 09/21/23 1250 09/26/23 1516  TROPONINIHS 6 7 22*     Chemistry Recent Labs  Lab 09/21/23 1250 09/22/23 0451 09/23/23 0900 09/26/23 1516  NA  --  142 140 137  K  --  5.5* 4.1 4.9  CL  --  111 106 105  CO2  --  26 22 24   GLUCOSE  --  103* 92 98  BUN  --  21 21 22   CREATININE  --  1.14 0.91 0.99  CALCIUM  --  9.2 9.2 9.6  MG 2.1  --   --   --   GFRNONAA  --  >60 >60 >60  ANIONGAP  --  5 12 8     No results for input(s): "PROT", "ALBUMIN", "AST", "ALT", "ALKPHOS", "BILITOT" in the last 168 hours. Lipids No results for input(s): "CHOL", "TRIG", "HDL", "LABVLDL", "LDLCALC", "CHOLHDL" in the last 168 hours.  Hematology Recent Labs  Lab 09/21/23 1042 09/26/23 1516  WBC 9.4 11.0*  RBC 3.97* 4.24  HGB 12.6* 13.7  HCT 38.6* 40.4  MCV 97.2 95.3  MCH 31.7 32.3  MCHC 32.6 33.9  RDW 12.5 12.6  PLT 254 240   Thyroid  Recent Labs  Lab 09/21/23 1250  TSH 0.750    BNPNo results for input(s): "BNP", "PROBNP" in the last 168 hours.  DDimer  Recent Labs  Lab 09/26/23 1628  DDIMER 1.25*     Radiology/Studies:  DG Chest 2 View Result Date: 09/26/2023 CLINICAL DATA:  Chest pain EXAM: CHEST - 2 VIEW COMPARISON:  Chest x-ray 09/15/2023 FINDINGS: Left-sided pacemaker is present. The  heart size and mediastinal contours are within normal limits. Both lungs are clear. The visualized skeletal structures are unremarkable. IMPRESSION: No active cardiopulmonary disease. Electronically Signed   By: Darliss Cheney M.D.   On: 09/26/2023 16:21   DG Chest 2 View Result Date: 09/23/2023 CLINICAL DATA:  Status post pacer placement EXAM: CHEST - 2 VIEW COMPARISON:  09/21/2023 FINDINGS: There is a left chest wall pacer device with leads in the right atrial appendage and right ventricle. No pneumothorax identified. Heart size and mediastinal contours are unremarkable. No pleural effusion, airspace consolidation or pneumothorax. Visualized osseous structures are unremarkable. IMPRESSION: Status post left chest wall pacer device placement. No pneumothorax. Electronically Signed   By: Signa Kell M.D.   On: 09/23/2023 05:38     Assessment and Plan:   1. Chest pain with mixed features, mildly elevated troponin, elevated d-dimer, hx of coronary calcification by calcium score - symptoms most suspicious for post-implant pericarditis rather than ACS, but troponin level is pending - CXR with normal cardiac silhouette size - CTA pending to exclude PE - will discuss further workup with MD  2. Paroxysmal atrial fibrillation s/p prior ablation - maintaining NSR by EKG, does not appear he has had known  clinical AFib since his ablation - will discuss anticoag plan with MD in context of treatment above  3. CHB s/p recent MDT PPM implantation - continue plan for EP follow-up as planned  4. Hyperlipidemia - patient has declined statin theray  5. H/o CVA - on Eliquis for management  Code status: full code  Dispo pending MD assessment/result review.  Risk Assessment/Risk Scores:    CHA2DS2-VASc Score = 4   This indicates a 4.8% annual risk of stroke. The patient's score is based upon: CHF History: 0 HTN History: 0 Diabetes History: 0 Stroke History: 2 Vascular Disease History: 1 Age  Score: 1 Gender Score: 0         For questions or updates, please contact [facility] Please consult [email or web address] for contact info under    Signed, Laurann Montana, PA-C  09/26/2023 6:10 PM

## 2023-09-26 NOTE — ED Provider Triage Note (Signed)
 Emergency Medicine Provider Triage Evaluation Note  Treasure Ingrum , a 68 y.o. male  was evaluated in triage.  Pt complains of central CP. Worse with exertion. SHOB with exertion. Had pacemaker placed 4 days ago. On thinner, Hx stroke  Review of Systems  Positive: SHOB, CP Negative: N/V/D, abd pain, HA  Physical Exam  BP 124/78 (BP Location: Right Arm)   Pulse 72   Temp 98.4 F (36.9 C) (Oral)   Resp 14   Ht 6' (1.829 m)   Wt 75.3 kg   SpO2 100%   BMI 22.51 kg/m  Gen:   Awake, no distress   Resp:  Normal effort  MSK:   Moves extremities without difficulty  Other:    Medical Decision Making  Medically screening exam initiated at 3:31 PM.  Appropriate orders placed.  Brando Taves was informed that the remainder of the evaluation will be completed by another provider, this initial triage assessment does not replace that evaluation, and the importance of remaining in the ED until their evaluation is complete.  Labs and imaging ordered   Gretta Began 09/26/23 1536

## 2023-09-27 NOTE — Telephone Encounter (Signed)
 Left message to call back

## 2023-09-28 ENCOUNTER — Encounter: Payer: Self-pay | Admitting: Physician Assistant

## 2023-09-28 ENCOUNTER — Ambulatory Visit: Attending: Physician Assistant | Admitting: Physician Assistant

## 2023-09-28 ENCOUNTER — Ambulatory Visit (HOSPITAL_BASED_OUTPATIENT_CLINIC_OR_DEPARTMENT_OTHER)

## 2023-09-28 VITALS — BP 120/80 | HR 62 | Ht 72.0 in | Wt 169.2 lb

## 2023-09-28 DIAGNOSIS — I312 Hemopericardium, not elsewhere classified: Secondary | ICD-10-CM

## 2023-09-28 DIAGNOSIS — I442 Atrioventricular block, complete: Secondary | ICD-10-CM | POA: Insufficient documentation

## 2023-09-28 DIAGNOSIS — I480 Paroxysmal atrial fibrillation: Secondary | ICD-10-CM | POA: Insufficient documentation

## 2023-09-28 DIAGNOSIS — I2510 Atherosclerotic heart disease of native coronary artery without angina pectoris: Secondary | ICD-10-CM | POA: Insufficient documentation

## 2023-09-28 LAB — ECHOCARDIOGRAM LIMITED
Area-P 1/2: 2 cm2
Height: 72 in
S' Lateral: 3.9 cm
Weight: 2707.2 [oz_av]

## 2023-09-28 NOTE — Assessment & Plan Note (Signed)
 CAC score 261 in the past. He is very active without chest pain. He is not on ASA as he is on anticoagulation. He has declined chol medications in the past.

## 2023-09-28 NOTE — Patient Instructions (Signed)
 Medication Instructions:  Your physician recommends that you continue on your current medications as directed. Please refer to the Current Medication list given to you today, but you can do Colchicine for 2 more weeks then stop.  *If you need a refill on your cardiac medications before your next appointment, please call your pharmacy*  Lab Work: None ordered  If you have labs (blood work) drawn today and your tests are completely normal, you will receive your results only by: MyChart Message (if you have MyChart) OR A paper copy in the mail If you have any lab test that is abnormal or we need to change your treatment, we will call you to review the results.  Testing/Procedures: Your physician has requested that you have an Limited Echocardiogram TODAY  09/28/23 3:05.  Echocardiography is a painless test that uses sound waves to create images of your heart. It provides your doctor with information about the size and shape of your heart and how well your heart's chambers and valves are working. This procedure takes approximately one hour. There are no restrictions for this procedure. Please do NOT wear cologne, perfume, aftershave, or lotions (deodorant is allowed). Please arrive 15 minutes prior to your appointment time.  Please note: We ask at that you not bring children with you during ultrasound (echo/ vascular) testing. Due to room size and safety concerns, children are not allowed in the ultrasound rooms during exams. Our front office staff cannot provide observation of children in our lobby area while testing is being conducted. An adult accompanying a patient to their appointment will only be allowed in the ultrasound room at the discretion of the ultrasound technician under special circumstances. We apologize for any inconvenience.   Follow-Up: At [facility], you and your health needs are our priority.  As part of our continuing mission to provide you with exceptional heart care,  our providers are all part of one team.  This team includes your primary Cardiologist (physician) and Advanced Practice Providers or APPs (Physician Assistants and Nurse Practitioners) who all work together to provide you with the care you need, when you need it.  Your next appointment:   Keep your appointments as scheduled   Provider:   You may see Will Jorja Loa, MD or one of the following Advanced Practice Providers on your designated Care Team:   Francis Dowse, [place] [address] [place], [place] Sherie Don, NP Canary Brim, NP    We recommend signing up for the patient portal called "MyChart".  Sign up information is provided on this After Visit Summary.  MyChart is used to connect with patients for Virtual Visits (Telemedicine).  Patients are able to view lab/test results, encounter notes, upcoming appointments, etc.  Non-urgent messages can be sent to your provider as well.   To learn more about what you can do with MyChart, go to [email or web address].   Other Instructions       1st Floor: - Lobby - Registration  - Pharmacy  - Lab - Cafe  2nd Floor: - PV Lab - Diagnostic Testing (echo, CT, nuclear med)  3rd Floor: - Vacant  4th Floor: - TCTS (cardiothoracic surgery) - AFib Clinic - Structural Heart Clinic - Vascular Surgery  - Vascular Ultrasound  5th Floor: - HeartCare Cardiology (general and EP) - Clinical Pharmacy for coumadin, hypertension, lipid, weight-loss medications, and med management appointments    Valet parking services will be available as well.

## 2023-09-28 NOTE — Progress Notes (Signed)
 Cardiology Office Note:    Date:  09/28/2023  ID:  Levi Davis, DOB 1955-09-13, MRN [id number] PCP: Shade Flood, MD  [facility] Providers Cardiologist:  Charlton Haws, MD Electrophysiologist:  Regan Lemming, MD       Patient Profile:      Paroxysmal atrial fibrillation  S/p PVI ablation 02/2022 Complete heart block S/p dual-chamber pacemaker 08/2023 Coronary artery Ca2+ CAC score 05/20/2020: 138 (66 percentile) CAC score 05/11/2021: 261 (74th percentile) TTE 09/22/23: EF 50-55, no RWMA, NL RVSF, mild RAE, mild MR, no effusion S/p CVA  Carotid artery disease w chronic L ICA occlusion Carotid US 06/08/2023: R ICA no stenosis; LICA 100 Hyperlipidemia  Pt has declined med Rx Lp(a) <75        Discussed the use of AI scribe software for clinical note transcription with the patient, who gave verbal consent to proceed.  History of Present Illness Levi Davis is a 68 y.o. male who returns for post hospital follow up. He was last seen by Dr. Eden Emms in 06/2023. Repeat CAC score was ordered but never done.   He was admitted 3/26-3/28 with near syncope and transient complete heart block. He underwent dual chamber PPM implantation. Post implant was unremarkable.  He recently presented to the emergency room 09/26/2023 with chest pain.  Troponins were minimally elevated with no delta.  D-dimer was elevated.  CT demonstrated no pulmonary embolism but there was a small hemopericardium noted.  Patient was seen by cardiology.  Case was reviewed with Dr. Anne Fu who is on-call.  He reviewed the CT and felt the fluid collection was extremely small.  He was placed on ibuprofen, PPI and colchicine to cover for pericarditis.  It was recommended he remain off of Eliquis until seen back in follow-up.  Notes were reviewed by Dr. Jimmey Ralph, who implanted his pacemaker. He suggested this could be a microperforation. His leads appeared to be in appropriate position. He agreed with trying to treat  for pericarditis first and if symptoms do not resolve then we would need to reposition the lead(s). He also needs a limited echo to assess effusion later this week to ensure no changes.   Data 09/26/2023:  K 4.9, creatinine 0.99, WBC 11, Hgb 13.7  hsTroponin 22, 23; D-dimer 1.25 ? , CRP 0.7, ESR 26 ? CXR no acute disease Chest CTA: no pulmonary embolism, small hemopericardium, moderate coronary artery calcification  He is here alone. He had minor chest pain yesterday and some mild dyspnea on exertion. Today, he feels much better today. He has not had further chest pain, shortness of breath. He is not taking ibuprofen. He is still on the Colchicine. He has not had orthopnea, edema, syncope.    ROS-See HPI     Studies Reviewed:       Results    Risk Assessment/Calculations:    CHA2DS2-VASc Score = 4   This indicates a 4.8% annual risk of stroke. The patient's score is based upon: CHF History: 0 HTN History: 0 Diabetes History: 0 Stroke History: 2 Vascular Disease History: 1 Age Score: 1 Gender Score: 0            Physical Exam:   VS:  BP 120/80   Pulse 62   Ht 6' (1.829 m)   Wt 169 lb 3.2 oz (76.7 kg)   SpO2 98%   BMI 22.95 kg/m    Wt Readings from Last 3 Encounters:  09/28/23 169 lb 3.2 oz (76.7 kg)  09/26/23 166 lb (  75.3 kg)  09/21/23 168 lb (76.2 kg)    Constitutional:      Appearance: Healthy appearance. Not in distress.  Neck:     Vascular: No JVR. JVD normal.  Pulmonary:     Breath sounds: Normal breath sounds. No wheezing. No rales.  Cardiovascular:     Normal rate. Regular rhythm.     Murmurs: There is no murmur.  Edema:    Peripheral edema absent.  Abdominal:     Palpations: Abdomen is soft.         Assessment and Plan:   Assessment & Plan Hemopericardium He is status post recent dual-chamber pacemaker implantation.  He presented back to the emergency room with chest discomfort that was pleuritic in nature and somewhat consistent with  pericarditis.  CT scan demonstrated no pulmonary embolism but did demonstrate a small amount of hemopericardium.  It is suspected that this is a microperforation.  He was placed on colchicine and feels much better.  He has not had any further chest discomfort or shortness of breath.  On exam today, he has no JVD or HJR.  He has normal blood pressure.  There was no pulses paradoxus.  I reviewed his case today with Dr. Nelly Laurence with electrophysiology.  We will continue to hold his Eliquis until he has a follow-up echocardiogram.  Limited echo has been scheduled for later today.  If his effusion is gone, we can resume his Eliquis.  Dr. Nelly Laurence suggested taking colchicine for just 2 weeks. -Continue colchicine 0.6 mg twice daily x 2 weeks, then stop -Limited TTE later today -Keep follow-up with device clinic 4/17 -Keep follow-up with Otilio Saber, PA-C in June -He knows to follow up sooner if symptoms change Complete heart block [facility]) S/p Pacemaker. Follow up with EP as planned.  Paroxysmal atrial fibrillation (HCC) He is currently off anticoagulation due to hemopericardium. Limited TTE pending. If effusion resolved, will resume Eliquis.  Coronary artery disease involving native coronary artery of native heart without angina pectoris CAC score 261 in the past. He is very active without chest pain. He is not on ASA as he is on anticoagulation. He has declined chol medications in the past.        Dispo:  Return for Scheduled Follow Up 4/17 for wound check with device clinic.  Signed, Tereso Newcomer, PA-C

## 2023-09-28 NOTE — Assessment & Plan Note (Signed)
 S/p Pacemaker. Follow up with EP as planned.

## 2023-09-28 NOTE — Assessment & Plan Note (Signed)
 He is currently off anticoagulation due to hemopericardium. Limited TTE pending. If effusion resolved, will resume Eliquis.

## 2023-09-29 ENCOUNTER — Other Ambulatory Visit (HOSPITAL_COMMUNITY)

## 2023-10-12 ENCOUNTER — Other Ambulatory Visit (HOSPITAL_BASED_OUTPATIENT_CLINIC_OR_DEPARTMENT_OTHER): Payer: Medicare Other

## 2023-10-13 ENCOUNTER — Ambulatory Visit: Attending: Internal Medicine

## 2023-10-13 DIAGNOSIS — I495 Sick sinus syndrome: Secondary | ICD-10-CM | POA: Diagnosis not present

## 2023-10-13 DIAGNOSIS — I442 Atrioventricular block, complete: Secondary | ICD-10-CM | POA: Diagnosis not present

## 2023-10-13 DIAGNOSIS — I480 Paroxysmal atrial fibrillation: Secondary | ICD-10-CM | POA: Diagnosis present

## 2023-10-13 LAB — CUP PACEART INCLINIC DEVICE CHECK
Date Time Interrogation Session: 20250417165905
Implantable Lead Connection Status: 753985
Implantable Lead Connection Status: 753985
Implantable Lead Implant Date: 20250327
Implantable Lead Implant Date: 20250327
Implantable Lead Location: 753859
Implantable Lead Location: 753860
Implantable Lead Model: 3830
Implantable Lead Model: 5076
Implantable Pulse Generator Implant Date: 20250327

## 2023-10-13 NOTE — Progress Notes (Signed)
 Normal dual chamber pacemaker wound check. Presenting rhythm: AS/VS . Wound well healed. Routine testing performed. Thresholds, sensing, and impedances consistent with implant measurements and at 3.5V safety margin/auto capture until 3 month visit. No episodes. Patient denied any chest pain. Reviewed arm restrictions to continue for 6 weeks total post op.  Pt enrolled in remote follow-up.

## 2023-10-13 NOTE — Patient Instructions (Signed)

## 2023-11-04 ENCOUNTER — Ambulatory Visit (INDEPENDENT_AMBULATORY_CARE_PROVIDER_SITE_OTHER)

## 2023-11-04 DIAGNOSIS — I442 Atrioventricular block, complete: Secondary | ICD-10-CM | POA: Diagnosis not present

## 2023-11-04 LAB — CUP PACEART REMOTE DEVICE CHECK
Battery Remaining Longevity: 173 mo
Battery Voltage: 3.23 V
Brady Statistic AP VP Percent: 0.02 %
Brady Statistic AP VS Percent: 21.34 %
Brady Statistic AS VP Percent: 0.08 %
Brady Statistic AS VS Percent: 78.55 %
Brady Statistic RA Percent Paced: 21.37 %
Brady Statistic RV Percent Paced: 0.11 %
Date Time Interrogation Session: 20250508224303
Implantable Lead Connection Status: 753985
Implantable Lead Connection Status: 753985
Implantable Lead Implant Date: 20250327
Implantable Lead Implant Date: 20250327
Implantable Lead Location: 753859
Implantable Lead Location: 753860
Implantable Lead Model: 3830
Implantable Lead Model: 5076
Implantable Pulse Generator Implant Date: 20250327
Lead Channel Impedance Value: 304 Ohm
Lead Channel Impedance Value: 418 Ohm
Lead Channel Impedance Value: 456 Ohm
Lead Channel Impedance Value: 608 Ohm
Lead Channel Pacing Threshold Amplitude: 0.875 V
Lead Channel Pacing Threshold Amplitude: 1 V
Lead Channel Pacing Threshold Pulse Width: 0.4 ms
Lead Channel Pacing Threshold Pulse Width: 0.4 ms
Lead Channel Sensing Intrinsic Amplitude: 2 mV
Lead Channel Sensing Intrinsic Amplitude: 2 mV
Lead Channel Sensing Intrinsic Amplitude: 31.625 mV
Lead Channel Sensing Intrinsic Amplitude: 31.625 mV
Lead Channel Setting Pacing Amplitude: 3.5 V
Lead Channel Setting Pacing Amplitude: 3.5 V
Lead Channel Setting Pacing Pulse Width: 0.4 ms
Lead Channel Setting Sensing Sensitivity: 1.2 mV
Zone Setting Status: 755011
Zone Setting Status: 755011

## 2023-11-11 ENCOUNTER — Other Ambulatory Visit: Payer: Self-pay | Admitting: Cardiology

## 2023-11-11 NOTE — Telephone Encounter (Signed)
 Prescription refill request for Eliquis  received. Indication:afib Last office visit:4/25 Scr:0.99  3/25 Age: 68 Weight:76.7  kg  Prescription refilled

## 2023-11-14 ENCOUNTER — Ambulatory Visit: Payer: Self-pay | Admitting: Cardiology

## 2023-12-09 NOTE — Progress Notes (Signed)
 Remote pacemaker transmission.

## 2023-12-19 ENCOUNTER — Ambulatory Visit: Payer: Medicare Other | Admitting: Cardiology

## 2023-12-23 ENCOUNTER — Ambulatory Visit: Attending: Student | Admitting: Student

## 2023-12-23 ENCOUNTER — Encounter: Payer: Self-pay | Admitting: Student

## 2023-12-23 VITALS — BP 109/62 | HR 59 | Ht 72.0 in | Wt 176.0 lb

## 2023-12-23 DIAGNOSIS — I495 Sick sinus syndrome: Secondary | ICD-10-CM | POA: Diagnosis present

## 2023-12-23 DIAGNOSIS — I480 Paroxysmal atrial fibrillation: Secondary | ICD-10-CM | POA: Insufficient documentation

## 2023-12-23 DIAGNOSIS — I442 Atrioventricular block, complete: Secondary | ICD-10-CM | POA: Diagnosis present

## 2023-12-23 LAB — CUP PACEART INCLINIC DEVICE CHECK
Battery Remaining Longevity: 171 mo
Battery Voltage: 3.22 V
Brady Statistic AP VP Percent: 0.04 %
Brady Statistic AP VS Percent: 25.66 %
Brady Statistic AS VP Percent: 0.08 %
Brady Statistic AS VS Percent: 74.22 %
Brady Statistic RA Percent Paced: 25.7 %
Brady Statistic RV Percent Paced: 0.12 %
Date Time Interrogation Session: 20250627103849
Implantable Lead Connection Status: 753985
Implantable Lead Connection Status: 753985
Implantable Lead Implant Date: 20250327
Implantable Lead Implant Date: 20250327
Implantable Lead Location: 753859
Implantable Lead Location: 753860
Implantable Lead Model: 3830
Implantable Lead Model: 5076
Implantable Pulse Generator Implant Date: 20250327
Lead Channel Impedance Value: 304 Ohm
Lead Channel Impedance Value: 437 Ohm
Lead Channel Impedance Value: 456 Ohm
Lead Channel Impedance Value: 627 Ohm
Lead Channel Pacing Threshold Amplitude: 0.875 V
Lead Channel Pacing Threshold Amplitude: 1.25 V
Lead Channel Pacing Threshold Pulse Width: 0.4 ms
Lead Channel Pacing Threshold Pulse Width: 0.4 ms
Lead Channel Sensing Intrinsic Amplitude: 2 mV
Lead Channel Sensing Intrinsic Amplitude: 2.375 mV
Lead Channel Sensing Intrinsic Amplitude: 31.625 mV
Lead Channel Sensing Intrinsic Amplitude: 31.625 mV
Lead Channel Setting Pacing Amplitude: 2 V
Lead Channel Setting Pacing Amplitude: 2.5 V
Lead Channel Setting Pacing Pulse Width: 0.4 ms
Lead Channel Setting Sensing Sensitivity: 1.2 mV
Zone Setting Status: 755011
Zone Setting Status: 755011

## 2023-12-23 NOTE — Progress Notes (Signed)
  Electrophysiology Office Note:   Levi Davis, Levi Davis, MRN [id number]  Primary Cardiologist: Maude Emmer, MD Electrophysiologist: Levi Gladis Norton, MD      History of Present Illness:   Levi Davis is a 68 y.o. male with h/o AF s/p ablation and intermittent CHB and SND s/p PPM seen today for routine electrophysiology follow-up s/p Pacemaker implant.  Since last being seen in our clinic the patient reports doing well. Overall, he denies chest pain, palpitations, dyspnea, PND, orthopnea, nausea, vomiting, dizziness, syncope, edema, weight gain, or early satiety.    Review of systems complete and found to be negative unless listed in HPI.   EP Information / Studies Reviewed:    EKG is ordered today. Personal review as below.  EKG Interpretation Date/Time:  Friday December 23 2023 10:16:20 EDT Ventricular Rate:  59 PR Interval:  220 QRS Duration:  150 QT Interval:  458 QTC Calculation: 453 R Axis:   -57  Text Interpretation: Sinus bradycardia with 1st degree A-V block Right bundle branch block Left anterior fascicular block Bifascicular block Left ventricular hypertrophy with repolarization abnormality ( R in aVL ) When compared with ECG of 26-Sep-2023 15:10, No significant change was found Confirmed by Lesia Sharper [phone]) on 12/23/2023 11:45:29 AM    PPM Interrogation-  reviewed in detail today,  See PACEART report.  Arrhythmia/Device History MDT Dual Chamber PPM 09/22/2023 for intermittent CHB / Symptomatic bradycardia  S/p PVI 02/2022   Physical Exam:   VS:  BP 109/62   Pulse (!) 59   Ht 6' (1.829 m)   Wt 176 lb (79.8 kg)   SpO2 98%   BMI 23.87 kg/m    Wt Readings from Last 3 Encounters:  12/23/23 176 lb (79.8 kg)  09/28/23 169 lb 3.2 oz (76.7 kg)  09/26/23 166 lb (75.3 kg)     GEN: No acute distress  NECK: No JVD; No carotid bruits CARDIAC: Regular rate and rhythm, no murmurs, rubs, gallops RESPIRATORY:  Clear to auscultation without rales,  wheezing or rhonchi  ABDOMEN: Soft, non-tender, non-distended EXTREMITIES:  No edema; No deformity   ASSESSMENT AND PLAN:    Symptomatic bradycardia s/p [employer] PPM  Normal PPM function See Pace Art report No changes today  PAF Cryptogenic Stroke Maintaining NSR s/p ablation 2023 Continue eliquis  5 mg BID for CHA2DS2VASc of at least 4  Secondary hypercoagulable state Pt on Eliquis  as above    Disposition:   Follow up with EP Team in 12 months  Signed, Sharper Prentice Lesia, PA-C

## 2023-12-23 NOTE — Progress Notes (Signed)
 Pt arrived, in waiting area.

## 2023-12-23 NOTE — Patient Instructions (Signed)
 Medication Instructions:  Your physician recommends that you continue on your current medications as directed. Please refer to the Current Medication list given to you today.  *If you need a refill on your cardiac medications before your next appointment, please call your pharmacy*  Lab Work: None ordered If you have labs (blood work) drawn today and your tests are completely normal, you will receive your results only by: MyChart Message (if you have MyChart) OR A paper copy in the mail If you have any lab test that is abnormal or we need to change your treatment, we will call you to review the results.  Follow-Up: At [facility], you and your health needs are our priority.  As part of our continuing mission to provide you with exceptional heart care, our providers are all part of one team.  This team includes your primary Cardiologist (physician) and Advanced Practice Providers or APPs (Physician Assistants and Nurse Practitioners) who all work together to provide you with the care you need, when you need it.  Your next appointment:   1 year(s)  Provider:   You may see Will Cortland Ding, MD or one of the following Advanced Practice Providers on your designated Care Team:   Mertha Abrahams, PA-C Michael Andy Tillery, PA-C Suzann Riddle, NP Creighton Doffing, NP

## 2023-12-25 ENCOUNTER — Ambulatory Visit: Payer: Self-pay | Admitting: Cardiology

## 2024-02-03 ENCOUNTER — Ambulatory Visit

## 2024-02-03 DIAGNOSIS — I442 Atrioventricular block, complete: Secondary | ICD-10-CM

## 2024-02-03 LAB — CUP PACEART REMOTE DEVICE CHECK
Battery Remaining Longevity: 174 mo
Battery Voltage: 3.21 V
Brady Statistic AP VP Percent: 0.09 %
Brady Statistic AP VS Percent: 34.86 %
Brady Statistic AS VP Percent: 0.09 %
Brady Statistic AS VS Percent: 64.96 %
Brady Statistic RA Percent Paced: 34.96 %
Brady Statistic RV Percent Paced: 0.18 %
Date Time Interrogation Session: 20250807235006
Implantable Lead Connection Status: 753985
Implantable Lead Connection Status: 753985
Implantable Lead Implant Date: 20250327
Implantable Lead Implant Date: 20250327
Implantable Lead Location: 753859
Implantable Lead Location: 753860
Implantable Lead Model: 3830
Implantable Lead Model: 5076
Implantable Pulse Generator Implant Date: 20250327
Lead Channel Impedance Value: 304 Ohm
Lead Channel Impedance Value: 361 Ohm
Lead Channel Impedance Value: 456 Ohm
Lead Channel Impedance Value: 627 Ohm
Lead Channel Pacing Threshold Amplitude: 0.875 V
Lead Channel Pacing Threshold Amplitude: 0.875 V
Lead Channel Pacing Threshold Pulse Width: 0.4 ms
Lead Channel Pacing Threshold Pulse Width: 0.4 ms
Lead Channel Sensing Intrinsic Amplitude: 1.75 mV
Lead Channel Sensing Intrinsic Amplitude: 1.75 mV
Lead Channel Sensing Intrinsic Amplitude: 31.625 mV
Lead Channel Sensing Intrinsic Amplitude: 31.625 mV
Lead Channel Setting Pacing Amplitude: 1.75 V
Lead Channel Setting Pacing Amplitude: 2 V
Lead Channel Setting Pacing Pulse Width: 0.4 ms
Lead Channel Setting Sensing Sensitivity: 1.2 mV
Zone Setting Status: 755011
Zone Setting Status: 755011

## 2024-02-06 ENCOUNTER — Ambulatory Visit: Payer: Self-pay | Admitting: Cardiology

## 2024-03-23 NOTE — Progress Notes (Signed)
 Remote PPM Transmission

## 2024-04-23 ENCOUNTER — Telehealth: Admitting: Physician Assistant

## 2024-04-23 DIAGNOSIS — M10072 Idiopathic gout, left ankle and foot: Secondary | ICD-10-CM | POA: Diagnosis not present

## 2024-04-23 MED ORDER — COLCHICINE 0.6 MG PO TABS: 0.6000 mg | ORAL_TABLET | Freq: Every day | ORAL | 1 refills | Status: AC

## 2024-04-23 MED ORDER — PREDNISONE 10 MG (21) PO TBPK: ORAL_TABLET | ORAL | 0 refills | Status: AC

## 2024-04-23 NOTE — Progress Notes (Signed)
 Virtual Visit Consent   Levi Davis, you are scheduled for a virtual visit with a [facility] provider today. Just as with appointments in the office, your consent must be obtained to participate. Your consent will be active for this visit and any virtual visit you may have with one of our providers in the next 365 days. If you have a MyChart account, a copy of this consent can be sent to you electronically.  As this is a virtual visit, video technology does not allow for your provider to perform a traditional examination. This may limit your provider's ability to fully assess your condition. If your provider identifies any concerns that need to be evaluated in person or the need to arrange testing (such as labs, EKG, etc.), we will make arrangements to do so. Although advances in technology are sophisticated, we cannot ensure that it will always work on either your end or our end. If the connection with a video visit is poor, the visit may have to be switched to a telephone visit. With either a video or telephone visit, we are not always able to ensure that we have a secure connection.  By engaging in this virtual visit, you consent to the provision of healthcare and authorize for your insurance to be billed (if applicable) for the services provided during this visit. Depending on your insurance coverage, you may receive a charge related to this service.  I need to obtain your verbal consent now. Are you willing to proceed with your visit today? [place] has provided verbal consent on 04/23/2024 for a virtual visit (video or telephone). Levi CHRISTELLA Dickinson, PA-C  Date: 04/23/2024 4:05 PM   Virtual Visit via Video Note   I, Levi Davis, connected with  Levi Davis  ([id number], Oct 03, 1955) on 04/23/24 at  4:00 PM EDT by a video-enabled telemedicine application and verified that I am speaking with the correct person using two identifiers.  Location: Patient: Virtual Visit Location  Patient: Home Provider: Virtual Visit Location Provider: Home Office   I discussed the limitations of evaluation and management by telemedicine and the availability of in person appointments. The patient expressed understanding and agreed to proceed.    History of Present Illness: Levi Davis is a 68 y.o. who identifies as a male who was assigned male at birth, and is being seen today for gout flare. Currently in left great toe. Started over the weekend following a golfing trip. Last flare was over 2 years ago. Used Allopurinol in the past, but made lifestyle changes and was able to get off.    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 Take 2 tablets at onset of flare, then 1 tablet every 12 hours  until pain subsides ( no more than 7 days consecutive), Disp: 30 tablet, Rfl: 1   predniSONE  (STERAPRED UNI-PAK 21 TAB) 10 MG (21) TBPK tablet, 6 day taper; take as directed on package instructions, Disp: 21 tablet, Rfl: 0   Acetylcysteine (N-ACETYL-L-CYSTEINE PO), Take 1 tablet by mouth daily., Disp: , Rfl:    ascorbic acid (VITAMIN C) 500 MG tablet, Take 500 mg by  mouth daily., Disp: , Rfl:    Cholecalciferol (DIALYVITE VITAMIN D 5000) 125 MCG (5000 UT) capsule, Take 5,000 Units by mouth daily., Disp: , Rfl:    Cyanocobalamin (B-12) 5000 MCG CAPS, Take 5,000 mcg by mouth daily., Disp: , Rfl:    ELIQUIS  5 MG TABS tablet, TAKE 1 TABLET TWICE A DAY, Disp: 180 tablet, Rfl: 3   L-ARGININE PO, Take 1 tablet by mouth daily., Disp: , Rfl:    NIACIN  PO, Take 1 tablet by mouth daily. Patient states its 640 mg per patient, Disp: , Rfl:    OVER THE COUNTER MEDICATION, Take 1 tablet by mouth daily. arterial protect supplement, Disp: , Rfl:    OVER THE COUNTER MEDICATION, Take 1 capsule by mouth daily. Super K, Disp: , Rfl:   Observations/Objective: Patient is well-developed, well-nourished in no acute distress.  Resting comfortably at home.  Head is normocephalic, atraumatic.  No labored breathing.  Speech is clear and coherent with logical content.  Patient is alert and oriented at baseline.    Assessment and Plan: 1. Acute idiopathic gout involving toe of left foot (Primary) - colchicine  0.6 MG tablet; Take 1 tablet (0.6 mg total) by mouth daily. Take 2 tablets at onset of flare, then 1 tablet every 12 hours until pain subsides ( no more than 7 days consecutive)  Dispense: 30 tablet; Refill: 1 - predniSONE  (STERAPRED UNI-PAK 21 TAB) 10 MG (21) TBPK tablet; 6 day taper; take as directed on package instructions  Dispense: 21 tablet; Refill: 0  - Acute on chronic gout flare - Prednisone  taper pack and colchicine  prescribed - Epsom salt soaks if needed - Ice if needed - Elevate - Rest - Seek in person evaluation if not improving  Follow Up Instructions: I discussed the assessment and treatment plan with the patient. The patient was provided an opportunity to ask questions and all were answered. The patient agreed with the plan and demonstrated an understanding of the instructions.  A copy of instructions were sent to the patient via MyChart unless otherwise  noted below.    The patient was advised to call back or seek an in-person evaluation if the symptoms worsen or if the condition fails to improve as anticipated.    Levi CHRISTELLA Dickinson, PA-C

## 2024-04-23 NOTE — Patient Instructions (Signed)
 Levi Davis, thank you for joining Delon Levi Dickinson, PA-C for today's virtual visit.  While this provider is not your primary care provider (PCP), if your PCP is located in our provider database this encounter information will be shared with them immediately following your visit.   A [facility] MyChart account gives you access to today's visit and all your visits, tests, and labs performed at [facility]  click here if you don't have a [facility] MyChart account or go to [email or web address]  Consent: (Patient) Levi Davis provided verbal consent for this virtual visit at the beginning of the encounter.  Current Medications:  Current Outpatient Medications:    colchicine  0.6 MG tablet, Take 1 tablet (0.6 mg total) by mouth daily. Take 2 tablets at onset of flare, then 1 tablet every 12 hours until pain subsides ( no more than 7 days consecutive), Disp: 30 tablet, Rfl: 1   predniSONE  (STERAPRED UNI-PAK 21 TAB) 10 MG (21) TBPK tablet, 6 day taper; take as directed on package instructions, Disp: 21 tablet, Rfl: 0   Acetylcysteine (N-ACETYL-L-CYSTEINE PO), Take 1 tablet by mouth daily., Disp: , Rfl:    ascorbic acid (VITAMIN C) 500 MG tablet, Take 500 mg by mouth daily., Disp: , Rfl:    Cholecalciferol (DIALYVITE VITAMIN D 5000) 125 MCG (5000 UT) capsule, Take 5,000 Units by mouth daily., Disp: , Rfl:    Cyanocobalamin (B-12) 5000 MCG CAPS, Take 5,000 mcg by mouth daily., Disp: , Rfl:    ELIQUIS  5 MG TABS tablet, TAKE 1 TABLET TWICE A DAY, Disp: 180 tablet, Rfl: 3   L-ARGININE PO, Take 1 tablet by mouth daily., Disp: , Rfl:    NIACIN  PO, Take 1 tablet by mouth daily. Patient states its 640 mg per patient, Disp: , Rfl:    OVER THE COUNTER MEDICATION, Take 1 tablet by mouth daily. arterial protect supplement, Disp: , Rfl:    OVER THE COUNTER MEDICATION, Take 1 capsule by mouth daily. Super K, Disp: , Rfl:    Medications ordered in this encounter:  Meds ordered this  encounter  Medications   colchicine  0.6 MG tablet    Sig: Take 1 tablet (0.6 mg total) by mouth daily. Take 2 tablets at onset of flare, then 1 tablet every 12 hours until pain subsides ( no more than 7 days consecutive)    Dispense:  30 tablet    Refill:  1    Supervising Provider:   LAMPTEY, Levi O [[id number]]   predniSONE  (STERAPRED UNI-PAK 21 TAB) 10 MG (21) TBPK tablet    Sig: 6 day taper; take as directed on package instructions    Dispense:  21 tablet    Refill:  0    Supervising Provider:   BLAISE ALEENE Davis [[id number]]     *If you need refills on other medications prior to your next appointment, please contact your pharmacy*  Follow-Up: Call back or seek an in-person evaluation if the symptoms worsen or if the condition fails to improve as anticipated.  [facility] [phone]  Other Instructions Gout  Gout is a condition that causes painful swelling of the joints. Gout is a type of inflammation of the joints (arthritis). This condition is caused by having too much uric acid in the body. Uric acid is a chemical that forms when the body breaks down substances called purines. Purines are important for building body proteins. When the body has too much uric acid, sharp crystals can form and build up inside  the joints. This causes pain and swelling. Gout attacks can happen quickly and may be very painful (acute gout). Over time, the attacks can affect more joints and become more frequent (chronic gout). Gout can also cause uric acid to build up under the skin and inside the kidneys. What are the causes? This condition is caused by too much uric acid in your blood. This can happen because: Your kidneys do not remove enough uric acid from your blood. This is the most common cause. Your body makes too much uric acid. This can happen with some cancers and cancer treatments. It can also occur if your body is breaking down too many red blood cells (hemolytic anemia). You  eat too many foods that are high in purines. These foods include organ meats and some seafood. Alcohol, especially beer, is also high in purines. A gout attack may be triggered by trauma or stress. What increases the risk? The following factors may make you more likely to develop this condition: Having a family history of gout. Being male and middle-aged. Being male and having gone through menopause. Taking certain medicines, including aspirin , cyclosporine, diuretics, levodopa, and niacin . Having an organ transplant. Having certain conditions, such as: Being obese. Lead poisoning. Kidney disease. A skin condition called psoriasis. Other factors include: Losing weight too quickly. Being dehydrated. Frequently drinking alcohol, especially beer. Frequently drinking beverages that are sweetened with a type of sugar called fructose. What are the signs or symptoms? An attack of acute gout happens quickly. It usually occurs in just one joint. The most common place is the big toe. Attacks often start at night. Other joints that may be affected include joints of the feet, ankle, knee, fingers, wrist, or elbow. Symptoms of this condition may include: Severe pain. Warmth. Swelling. Stiffness. Tenderness. The affected joint may be very painful to touch. Shiny, red, or purple skin. Chills and fever. Chronic gout may cause symptoms more frequently. More joints may be involved. You may also have white or yellow lumps (tophi) on your hands or feet or in other areas near your joints. How is this diagnosed? This condition is diagnosed based on your symptoms, your medical history, and a physical exam. You may have tests, such as: Blood tests to measure uric acid levels. Removal of joint fluid with a thin needle (aspiration) to look for uric acid crystals. X-rays to look for joint damage. How is this treated? Treatment for this condition has two phases: treating an acute attack and preventing  future attacks. Acute gout treatment may include medicines to reduce pain and swelling, including: NSAIDs, such as ibuprofen . Steroids. These are strong anti-inflammatory medicines that can be taken by mouth (orally) or injected into a joint. Colchicine . This medicine relieves pain and swelling when it is taken soon after an attack. It can be given by mouth or through an IV. Preventive treatment may include: Daily use of smaller doses of NSAIDs or colchicine . Use of a medicine that reduces uric acid levels in your blood, such as allopurinol. Changes to your diet. You may need to see a dietitian about what to eat and drink to prevent gout. Follow these instructions at home: During a gout attack  If directed, put ice on the affected area. To do this: Put ice in a plastic bag. Place a towel between your skin and the bag. Leave the ice on for 20 minutes, 2-3 times a day. Remove the ice if your skin turns bright red. This is very important. If you  cannot feel pain, heat, or cold, you have a greater risk of damage to the area. Raise (elevate) the affected joint above the level of your heart as often as possible. Rest the joint as much as possible. If the affected joint is in your leg, you may be given crutches to use. Follow instructions from your health care provider about eating or drinking restrictions. Avoiding future gout attacks Follow a low-purine diet as told by your dietitian or health care provider. Avoid foods and drinks that are high in purines, including liver, kidney, anchovies, asparagus, herring, mushrooms, mussels, and beer. Maintain a healthy weight or lose weight if you are overweight. If you want to lose weight, talk with your health care provider. Do not lose weight too quickly. Start or maintain an exercise program as told by your health care provider. Eating and drinking Avoid drinking beverages that contain fructose. Drink enough fluids to keep your urine pale yellow. If  you drink alcohol: Limit how much you have to: 0-1 drink a day for women who are not pregnant. 0-2 drinks a day for men. Know how much alcohol is in a drink. In the [place] one drink equals one 12 oz bottle of beer (355 mL), one 5 oz glass of wine (148 mL), or one 1 oz glass of hard liquor (44 mL). General instructions Take over-the-counter and prescription medicines only as told by your health care provider. Ask your health care provider if the medicine prescribed to you requires you to avoid driving or using machinery. Return to your normal activities as told by your health care provider. Ask your health care provider what activities are safe for you. Keep all follow-up visits. This is important. Where to find more information [employer]: [email or web address] Contact a health care provider if you have: Another gout attack. Continuing symptoms of a gout attack after 10 days of treatment. Side effects from your medicines. Chills or a fever. Burning pain when you urinate. Pain in your lower back or abdomen. Get help right away if you: Have severe or uncontrolled pain. Cannot urinate. Summary Gout is painful swelling of the joints caused by having too much uric acid in the body. The most common site for gout to occur is in the big toe, but it can affect other joints in the body. Medicines and dietary changes can help to prevent and treat gout attacks. This information is not intended to replace advice given to you by your health care provider. Make sure you discuss any questions you have with your health care provider. Document Revised: 03/18/2021 Document Reviewed: 03/18/2021 Elsevier Patient Education  2024 Elsevier Inc.   If you have been instructed to have an in-person evaluation today at a local Urgent Care facility, please use the link below. It will take you to a list of all of our available [facility], including address, phone number and hours of  operation. Please do not delay care.  [facility]  If you or a family member do not have a primary care provider, use the link below to schedule a visit and establish care. When you choose a [facility] primary care physician or advanced practice provider, you gain a long-term partner in health. Find a Primary Care Provider  Learn more about [facility]'s in-office and virtual care options: [facility] - Get Care Now

## 2024-05-04 ENCOUNTER — Ambulatory Visit (INDEPENDENT_AMBULATORY_CARE_PROVIDER_SITE_OTHER)

## 2024-05-04 DIAGNOSIS — I442 Atrioventricular block, complete: Secondary | ICD-10-CM

## 2024-05-06 LAB — CUP PACEART REMOTE DEVICE CHECK
Battery Remaining Longevity: 170 mo
Battery Voltage: 3.19 V
Brady Statistic AP VP Percent: 0.93 %
Brady Statistic AP VS Percent: 35.08 %
Brady Statistic AS VP Percent: 0.53 %
Brady Statistic AS VS Percent: 63.46 %
Brady Statistic RA Percent Paced: 36.03 %
Brady Statistic RV Percent Paced: 1.46 %
Date Time Interrogation Session: 20251107021431
Implantable Lead Connection Status: 753985
Implantable Lead Connection Status: 753985
Implantable Lead Implant Date: 20250327
Implantable Lead Implant Date: 20250327
Implantable Lead Location: 753859
Implantable Lead Location: 753860
Implantable Lead Model: 3830
Implantable Lead Model: 5076
Implantable Pulse Generator Implant Date: 20250327
Lead Channel Impedance Value: 285 Ohm
Lead Channel Impedance Value: 361 Ohm
Lead Channel Impedance Value: 456 Ohm
Lead Channel Impedance Value: 608 Ohm
Lead Channel Pacing Threshold Amplitude: 0.875 V
Lead Channel Pacing Threshold Amplitude: 0.875 V
Lead Channel Pacing Threshold Pulse Width: 0.4 ms
Lead Channel Pacing Threshold Pulse Width: 0.4 ms
Lead Channel Sensing Intrinsic Amplitude: 2.375 mV
Lead Channel Sensing Intrinsic Amplitude: 2.375 mV
Lead Channel Sensing Intrinsic Amplitude: 31.625 mV
Lead Channel Sensing Intrinsic Amplitude: 31.625 mV
Lead Channel Setting Pacing Amplitude: 1.75 V
Lead Channel Setting Pacing Amplitude: 2 V
Lead Channel Setting Pacing Pulse Width: 0.4 ms
Lead Channel Setting Sensing Sensitivity: 1.2 mV
Zone Setting Status: 755011
Zone Setting Status: 755011

## 2024-05-08 ENCOUNTER — Ambulatory Visit: Payer: Self-pay | Admitting: Cardiology

## 2024-05-08 NOTE — Progress Notes (Signed)
 Remote PPM Transmission

## 2024-06-08 ENCOUNTER — Encounter: Payer: Self-pay | Admitting: Cardiovascular Disease

## 2024-08-03 ENCOUNTER — Encounter

## 2024-11-02 ENCOUNTER — Encounter

## 2025-02-01 ENCOUNTER — Encounter

## 2025-05-03 ENCOUNTER — Encounter

## 2025-08-02 ENCOUNTER — Encounter
